# Patient Record
Sex: Female | Born: 1970
Health system: Southern US, Community
[De-identification: ages and names within clinical notes are randomized; demographics above are authoritative.]

## PROBLEM LIST (undated history)

## (undated) DIAGNOSIS — F319 Bipolar disorder, unspecified: Secondary | ICD-10-CM

## (undated) DIAGNOSIS — F309 Manic episode, unspecified: Secondary | ICD-10-CM

## (undated) DIAGNOSIS — F329 Major depressive disorder, single episode, unspecified: Secondary | ICD-10-CM

## (undated) DIAGNOSIS — F32A Depression, unspecified: Secondary | ICD-10-CM

## (undated) HISTORY — DX: Major depressive disorder, single episode, unspecified: F32.9

## (undated) HISTORY — DX: Depression, unspecified: F32.A

## (undated) HISTORY — PX: BACK SURGERY: SHX140

## (undated) HISTORY — DX: Manic episode, unspecified: F30.9

---

## 1994-01-16 HISTORY — PX: MYOMECTOMY: SHX85

## 2004-03-16 ENCOUNTER — Ambulatory Visit (HOSPITAL_COMMUNITY): Admission: RE | Admit: 2004-03-16 | Discharge: 2004-03-16 | Payer: Self-pay | Admitting: *Deleted

## 2005-03-31 ENCOUNTER — Ambulatory Visit (HOSPITAL_COMMUNITY): Admission: RE | Admit: 2005-03-31 | Discharge: 2005-03-31 | Payer: Self-pay | Admitting: *Deleted

## 2005-07-07 ENCOUNTER — Other Ambulatory Visit: Admission: RE | Admit: 2005-07-07 | Discharge: 2005-07-07 | Payer: Self-pay | Admitting: Obstetrics & Gynecology

## 2005-11-20 ENCOUNTER — Ambulatory Visit (HOSPITAL_COMMUNITY): Payer: Self-pay | Admitting: Psychiatry

## 2005-12-20 ENCOUNTER — Ambulatory Visit (HOSPITAL_COMMUNITY): Payer: Self-pay | Admitting: Psychiatry

## 2006-02-19 ENCOUNTER — Ambulatory Visit (HOSPITAL_COMMUNITY): Payer: Self-pay | Admitting: Psychiatry

## 2006-03-15 ENCOUNTER — Ambulatory Visit: Payer: Self-pay | Admitting: Family Medicine

## 2006-03-15 DIAGNOSIS — F3160 Bipolar disorder, current episode mixed, unspecified: Secondary | ICD-10-CM | POA: Insufficient documentation

## 2006-03-15 LAB — CONVERTED CEMR LAB: Rapid Strep: POSITIVE

## 2006-04-09 ENCOUNTER — Ambulatory Visit (HOSPITAL_COMMUNITY): Payer: Self-pay | Admitting: Psychiatry

## 2006-07-23 ENCOUNTER — Ambulatory Visit (HOSPITAL_COMMUNITY): Payer: Self-pay | Admitting: Psychiatry

## 2006-10-29 ENCOUNTER — Ambulatory Visit (HOSPITAL_COMMUNITY): Payer: Self-pay | Admitting: Psychiatry

## 2007-02-18 ENCOUNTER — Ambulatory Visit (HOSPITAL_COMMUNITY): Payer: Self-pay | Admitting: Psychiatry

## 2007-04-17 ENCOUNTER — Ambulatory Visit (HOSPITAL_COMMUNITY): Payer: Self-pay | Admitting: Psychiatry

## 2007-08-12 ENCOUNTER — Ambulatory Visit (HOSPITAL_COMMUNITY): Payer: Self-pay | Admitting: Psychiatry

## 2007-09-09 ENCOUNTER — Ambulatory Visit (HOSPITAL_COMMUNITY): Payer: Self-pay | Admitting: Psychiatry

## 2007-11-04 ENCOUNTER — Ambulatory Visit (HOSPITAL_COMMUNITY): Payer: Self-pay | Admitting: Psychiatry

## 2008-02-10 ENCOUNTER — Ambulatory Visit (HOSPITAL_COMMUNITY): Payer: Self-pay | Admitting: Psychiatry

## 2008-04-06 ENCOUNTER — Ambulatory Visit (HOSPITAL_COMMUNITY): Payer: Self-pay | Admitting: Psychiatry

## 2008-06-01 ENCOUNTER — Ambulatory Visit (HOSPITAL_COMMUNITY): Payer: Self-pay | Admitting: Psychiatry

## 2008-07-29 ENCOUNTER — Ambulatory Visit (HOSPITAL_COMMUNITY): Payer: Self-pay | Admitting: Psychiatry

## 2008-10-26 ENCOUNTER — Ambulatory Visit (HOSPITAL_COMMUNITY): Payer: Self-pay | Admitting: Psychiatry

## 2008-12-18 ENCOUNTER — Ambulatory Visit (HOSPITAL_COMMUNITY): Payer: Self-pay | Admitting: Psychiatry

## 2009-03-15 ENCOUNTER — Ambulatory Visit (HOSPITAL_COMMUNITY): Payer: Self-pay | Admitting: Psychiatry

## 2009-04-30 ENCOUNTER — Ambulatory Visit (HOSPITAL_COMMUNITY): Payer: Self-pay | Admitting: Psychiatry

## 2009-05-28 ENCOUNTER — Ambulatory Visit (HOSPITAL_COMMUNITY): Payer: Self-pay | Admitting: Licensed Clinical Social Worker

## 2009-07-13 ENCOUNTER — Ambulatory Visit (HOSPITAL_COMMUNITY): Payer: Self-pay | Admitting: Licensed Clinical Social Worker

## 2009-08-13 ENCOUNTER — Ambulatory Visit (HOSPITAL_COMMUNITY): Payer: Self-pay | Admitting: Psychiatry

## 2009-10-13 ENCOUNTER — Ambulatory Visit (HOSPITAL_COMMUNITY): Payer: Self-pay | Admitting: Psychiatry

## 2009-10-25 ENCOUNTER — Ambulatory Visit: Payer: Self-pay | Admitting: Family Medicine

## 2009-10-25 DIAGNOSIS — M79609 Pain in unspecified limb: Secondary | ICD-10-CM | POA: Insufficient documentation

## 2009-10-25 DIAGNOSIS — M722 Plantar fascial fibromatosis: Secondary | ICD-10-CM

## 2009-12-21 ENCOUNTER — Ambulatory Visit: Payer: Self-pay | Admitting: Family Medicine

## 2009-12-21 DIAGNOSIS — F329 Major depressive disorder, single episode, unspecified: Secondary | ICD-10-CM

## 2009-12-21 DIAGNOSIS — F3289 Other specified depressive episodes: Secondary | ICD-10-CM | POA: Insufficient documentation

## 2009-12-21 DIAGNOSIS — M546 Pain in thoracic spine: Secondary | ICD-10-CM

## 2009-12-28 ENCOUNTER — Ambulatory Visit: Payer: Self-pay | Admitting: Emergency Medicine

## 2009-12-28 ENCOUNTER — Encounter
Admission: RE | Admit: 2009-12-28 | Discharge: 2009-12-28 | Payer: Self-pay | Source: Home / Self Care | Attending: Emergency Medicine | Admitting: Emergency Medicine

## 2010-01-03 ENCOUNTER — Ambulatory Visit (HOSPITAL_COMMUNITY): Payer: Self-pay | Admitting: Psychiatry

## 2010-01-06 ENCOUNTER — Encounter: Payer: Self-pay | Admitting: Emergency Medicine

## 2010-02-15 NOTE — Letter (Signed)
Summary: Out of Work  [facility]  [address]   [place], [place] [address]   Phone: [phone]  Fax: [phone]    December 21, 2009   Employee:  MAYGAN KOELLER    To Whom It May Concern:   For Medical reasons, please excuse the above named employee from work today and tomorrow.      If you need additional information, please feel free to contact our office.         Sincerely,    Donna Christen MD

## 2010-02-15 NOTE — Letter (Signed)
Summary: CONTROLLED MED RX POLICY  CONTROLLED MED RX POLICY   Imported By: Dannette Barbara 12/21/2009 16:27:44  _____________________________________________________________________  External Attachment:    Type:   Image     Comment:   External Document

## 2010-02-15 NOTE — Assessment & Plan Note (Signed)
Summary: Both feet hurting, plantar fascititis   Vital Signs:  Patient profile:   40 year old female Height:      65 inches Weight:      202 pounds Pulse rate:   94 / minute BP sitting:   116 / 79  (left arm) Cuff size:   large  Vitals Entered By: Avon Gully CMA, Duncan Dull) (October 25, 2009 9:56 AM) CC: bot feet hurt the left has been hurting more in the past week, pain in the heel which radiated to the toes    Primary Care Provider:  Linford Arnold, C  CC:  bot feet hurt the left has been hurting more in the past week and pain in the heel which radiated to the toes .  History of Present Illness: bot feet hurt the left has been hurting more in the past week, pain in the left heel which radiated to the toes. Painis also in her distal arches. Pain is intermittant for the last 10 years but has been getting worse. Thinks had old injury to the right foot, not sure if a fracture.  Worse with beeing up on her feet. Worse at the end ofo the day. Does wear comfortable shoes. NO pain meds. The heel  pain is more new in the last few weeks. Painful to step on that foot when she first gets out of bed. Has never had the heel pain before.   Current Medications (verified): 1)  Lamictal 150 Mg Tabs (Lamotrigine) .... Take 1 Tablet By Mouth Once A Day 2)  Wellbutrin Xl 300 Mg Xr24h-Tab (Bupropion Hcl) .... Take One Tablet By Mouth Once A Day 3)  Clonazepam 0.5 Mg Tabs (Clonazepam) .... Take One Tablet By  Jodi Geralds Once A Day As Needed  Allergies (verified): No Known Drug Allergies  Comments:  Nurse/Medical Assistant: The patient's medications and allergies were reviewed with the patient and were updated in the Medication and Allergy Lists. Avon Gully CMA, Duncan Dull) (October 25, 2009 9:58 AM)  Physical Exam  General:  Well-developed,well-nourished,in no acute distress; alert,appropriate and cooperative throughout examination Msk:  Has mild collapse of the proximal arch of the foot and collpase of  the distal arch as well when she stand. DP pulse 2+. no laxity of the ankles Ankle strength 5/5.  No heel tenderness.    Impression & Recommendations:  Problem # 1:  FOOT PAIN, BILATERAL (ICD-729.5)  I do think she would benefit from metatarsal cookies since most o fher pain and foot sorenesss in in the distal areas. I don't think she has any known trauma or fractures.  Gave websit to order the cookies from. Also recommend since has had long term foot pain to consider custom orthotic inserts.   Orders: Sports Medicine (Sports Med)  Problem # 2:  PLANTAR FASCIITIS (ICD-728.71) Discussed dx. REcommmend icey massage, NSAIDs, stretches and comfortable footwear. Explained that this take a while to improve. If not better in one month then will rfer fo night splints and possibly injections. Pt will call and let me know if not better. Given H.O on stretches.   Complete Medication List: 1)  Lamictal 150 Mg Tabs (Lamotrigine) .... Take 1 tablet by mouth once a day 2)  Wellbutrin Xl 300 Mg Xr24h-tab (Bupropion hcl) .... Take one tablet by mouth once a day 3)  Clonazepam 0.5 Mg Tabs (Clonazepam) .... Take one tablet by  mouhth once a day as needed  Patient Instructions: 1)  Go to [email or web address] for metatarsal cookies to support your distal  foot  2)  For the heel pain (plantar fascititis) recommend Aleve two times a day with food. Stretches and supportive shoes. Also icey massage will help as well 3)  Will refer you for custom inserts for foot pain.   Contraindications/Deferment of Procedures/Staging:    Test/Procedure: FLU VAX    Reason for deferment: patient declined

## 2010-02-15 NOTE — Letter (Signed)
Summary: Consult Scheduled Letter-Sports Med  [facility]  [address]   [place], [place] [address]   Phone: [phone]  Fax: [phone]    10/25/2009 MRN: [id number]    Dear Ms. Lauture,      We have scheduled an appointment for you.  At the recommendation of Dr. Linford Arnold, we have scheduled you a consult with Dr. Pearletha Forge at the [facility] in [facility] on November 01, 2009 at 10:00.  Their phone number is [phone].  If this appointment day and time is not convenient for you, please feel free to call the office of the doctor you are being referred to at the number listed above and reschedule the appointment.     Thank you,  Diane Insurance risk surveyor at [facility]

## 2010-02-15 NOTE — Assessment & Plan Note (Signed)
Summary: BACK PAIN (rm 2)   Vital Signs:  Patient Profile:   40 Years Old Female CC:      right upper back pain x today Height:     65 inches Weight:      205 pounds O2 Sat:      99 % O2 treatment:    Room Air Temp:     98.4 degrees F oral Pulse rate:   82 / minute Resp:     16 per minute BP sitting:   138 / 94  (left arm) Cuff size:   large  Pt. in pain?   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  The pain is dull/achey and worse with movement and deep inspiration.  No cough.  No fevers, chills, and sweats.  No rash.  No GI or GU symptoms   REVIEW OF SYSTEMS Constitutional Symptoms      Denies fever, chills, night sweats, weight loss, weight gain, and fatigue.  Eyes       Denies change in vision, eye pain, eye discharge, glasses, contact lenses, and eye surgery. Ear/Nose/Throat/Mouth       Denies hearing loss/aids, change in hearing, ear pain, ear discharge, dizziness, frequent runny nose, frequent nose bleeds, sinus problems, sore throat, hoarseness, and tooth pain or bleeding.  Respiratory       Denies dry cough, productive cough, wheezing, shortness of breath, asthma, bronchitis, and emphysema/COPD.  Cardiovascular       Denies murmurs, chest pain, and tires easily with exhertion.     Gastrointestinal       Denies stomach pain, nausea/vomiting, diarrhea, constipation, blood in bowel movements, and indigestion. Genitourniary       Denies painful urination, kidney stones, and loss of urinary control. Neurological       Denies paralysis, seizures, and fainting/blackouts. Musculoskeletal       Complains of muscle pain, joint stiffness, and decreased range of motion.      Denies joint pain, redness, swelling, muscle weakness, and gout.  Skin       Denies bruising, unusual mles/lumps or sores, and hair/skin or nail changes.  Psych       Denies mood changes, temper/anger issues, anxiety/stress, speech problems, depression, and sleep problems. 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 She c/o sharp shooting pain since then.   Past History:  Past Medical History: Psych is Dr. Francella Solian ([place]) Depression   Objective:  Appearance:  Patient appears healthy, stated age, and in no acute distress  Eyes:  Pupils are equal, round, and reactive to light and accomdation.  Extraocular movement is intact.  Conjunctivae are not inflamed.  Pharynx:  Normal  Neck:  Supple.  No adenopathy is present.  No thyromegaly is present.  No tenderness  Lungs:  Clear to auscultation.  Breath sounds are equal.  Heart:  Regular rate and rhythm without murmurs, rubs, or gallops.  Abdomen:  Nontender without masses or hepatosplenomegaly.  Bowel sounds are present.  No CVA or flank tenderness.  Back:  Distinct tenderness along inferior edge of right scapula, worse with resisted abduction of the right shoulder. Skin:  No rash Assessment New Problems: BACK PAIN, THORACIC REGION, RIGHT (ICD-724.1) DEPRESSION (ICD-311)  RHOMBOID STRAIN  Plan New Medications/Changes: NAPROXEN 500 MG TABS (NAPROXEN) One by mouth two times a day pc  #20 x 0, 12/21/2009, Donna Christen MD  New Orders: New Patient Level III [phone] Planning Comments:   Begin  applying ice pack.  Begin Naproxen.  Begin range of motion exercises in 2 to 3 days ([employer] information and instruction patient handout given)  If not improving 2 weeks, recommend follow-up with sports med clinic. Advised to call back if rash develops (shingles)   The patient and/or caregiver has been counseled thoroughly with regard to medications prescribed including dosage, schedule, interactions, rationale for use, and possible side effects and they verbalize understanding.  Diagnoses and expected course of recovery discussed and will return if not improved as expected or if the condition worsens. Patient and/or caregiver verbalized understanding.  Prescriptions: NAPROXEN 500 MG TABS (NAPROXEN) One by mouth two times a day pc  #20 x 0   Entered and Authorized by:   Donna Christen MD   Signed by:   Donna Christen MD on 12/21/2009   Method used:   Print then Give to Patient   RxID:   [id number]   Orders Added: 1)  New Patient Level III [95621]

## 2010-02-17 NOTE — Letter (Signed)
Summary: RELEASE OF INFORMATION  RELEASE OF INFORMATION   Imported By: Dannette Barbara 01/06/2010 13:53:54  _____________________________________________________________________  External Attachment:    Type:   Image     Comment:   External Document

## 2010-03-16 ENCOUNTER — Encounter (HOSPITAL_COMMUNITY): Payer: Commercial Managed Care - PPO | Admitting: Psychiatry

## 2010-03-16 DIAGNOSIS — F319 Bipolar disorder, unspecified: Secondary | ICD-10-CM

## 2010-04-19 ENCOUNTER — Encounter: Payer: Self-pay | Admitting: Family Medicine

## 2010-04-25 ENCOUNTER — Encounter: Payer: Self-pay | Admitting: Family Medicine

## 2010-04-25 ENCOUNTER — Ambulatory Visit (INDEPENDENT_AMBULATORY_CARE_PROVIDER_SITE_OTHER): Payer: Commercial Managed Care - PPO | Admitting: Family Medicine

## 2010-04-25 VITALS — BP 173/84 | HR 102 | Wt 215.0 lb

## 2010-04-25 DIAGNOSIS — R5381 Other malaise: Secondary | ICD-10-CM

## 2010-04-25 DIAGNOSIS — R5383 Other fatigue: Secondary | ICD-10-CM

## 2010-04-25 LAB — CBC
HCT: 41.8 % (ref 36.0–46.0)
Hemoglobin: 14.6 g/dL (ref 12.0–15.0)
MCHC: 34.9 g/dL (ref 30.0–36.0)
MCV: 93.8 fL (ref 78.0–100.0)
Platelets: 230 10*3/uL (ref 150–400)
RBC: 4.45 MIL/uL (ref 3.87–5.11)
RDW: 13.2 % (ref 11.5–15.5)

## 2010-04-25 NOTE — Progress Notes (Signed)
  Subjective:    Patient ID: Charlotte Hobbs, female    DOB: 08-12-1970, 40 y.o.   MRN: [id number]  HPI She is very active  But is gaining weight and she is fatigued. Has gained about 40 lb sin the last 2 years. Does work out 2-3 days per week for 45 min. She is on her feet all day. Says doesnt think she should weight this much.  No family hx of thyroid problems. Personal hx of anemia.  Having her period twice a month. Intermitant constipation and diarrhea. Rarely normal. Has had slight chest pain on and off for years.  Small amt of thinning of her hair on the top. She is still smoking.   Frequent urination.  No dysuria. No blood in the stool.  Noticed some SOB but not with exercises.  Can happen at rest. That started about a year ago.  This is daily.    On Wellbutrin for hands tremors. She is also feeling some irritability. Feels she having some breakthrough symptoms. Home life is hectic.  Job is stresful but this has been this way  7 years. Sleeping well. NO snoring.    Review of Systems     Objective:   Physical Exam  Constitutional: She is oriented to person, place, and time. She appears well-developed.  HENT:  Head: Normocephalic and atraumatic.  Right Ear: External ear normal.  Left Ear: External ear normal.  Nose: Nose normal.  Mouth/Throat: Oropharynx is clear and moist.  Eyes: Pupils are equal, round, and reactive to light.  Neck: Normal range of motion. Neck supple. No thyromegaly present.       Small LN on the right side of neck along the sternocletomastoid.   Cardiovascular: Normal rate, regular rhythm and normal heart sounds.   Pulmonary/Chest: Effort normal and breath sounds normal.  Abdominal: Soft. Bowel sounds are normal. She exhibits no distension and no mass. There is no tenderness.  Musculoskeletal: She exhibits no edema.  Lymphadenopathy:    She has cervical adenopathy.  Neurological: She is alert and oriented to person, place, and time. She has normal reflexes.    Skin: Skin is warm and dry.  Psychiatric: She has a normal mood and affect.          Assessment & Plan:  Fatigue- Unclear etiology. Will rule out anemia since she is having frequent periods and has a hx of anemia.  Also will rule out thyroid d/o. Discussed this could.  Will also evaluatel the liver and kidney function.  Consider also her mood may be contributing but she feels this is stable.  It doesn't sound like she has sleep apnea.   Weight gain - If labs are normal then also consider counseling for diet and increasing her days for exercise.  None of her meds should be causing weight gain.

## 2010-04-26 ENCOUNTER — Telehealth: Payer: Self-pay | Admitting: Family Medicine

## 2010-04-26 LAB — COMPLETE METABOLIC PANEL WITH GFR
ALT: 17 U/L (ref 0–35)
AST: 13 U/L (ref 0–37)
Albumin: 4.2 g/dL (ref 3.5–5.2)
Alkaline Phosphatase: 71 U/L (ref 39–117)
BUN: 13 mg/dL (ref 6–23)
GFR, Est Non African American: >60 mL/min (ref >60)
Glucose, Bld: 99 mg/dL (ref 70–99)
Potassium: 4.3 mEq/L (ref 3.5–5.3)
Sodium: 139 mEq/L (ref 135–145)
Total Bilirubin: 0.5 mg/dL (ref 0.3–1.2)
Total Protein: 6.7 g/dL (ref 6.0–8.3)

## 2010-04-26 LAB — FOLATE: Folate: 10.8 ng/mL

## 2010-04-26 NOTE — Telephone Encounter (Signed)
Call pt: Labs overall look great. Thyroid is nl.  B12 is on the low end of normal. Rec start daily OTC Vit B12 supplement. This will help you energy level. Also your vit D was low. Start 1000iu daily OTC as well. Then we can recheck levels in 3 months. I know she is concerned about her wt as well. We can refer for nutrition counseling and rec increase exercise to 5 days per week to wt loss.

## 2010-04-26 NOTE — Telephone Encounter (Signed)
LMOM for pt to callif want nutrition referral and left results on pts vm

## 2010-05-09 ENCOUNTER — Encounter (HOSPITAL_COMMUNITY): Payer: 59 | Admitting: Psychiatry

## 2010-05-09 DIAGNOSIS — F3189 Other bipolar disorder: Secondary | ICD-10-CM

## 2010-05-11 ENCOUNTER — Encounter (HOSPITAL_COMMUNITY): Payer: Commercial Managed Care - PPO | Admitting: Psychiatry

## 2010-07-11 ENCOUNTER — Encounter (HOSPITAL_COMMUNITY): Payer: 59 | Admitting: Psychiatry

## 2010-07-11 DIAGNOSIS — F3189 Other bipolar disorder: Secondary | ICD-10-CM

## 2010-09-05 ENCOUNTER — Encounter (HOSPITAL_COMMUNITY): Payer: 59 | Admitting: Psychiatry

## 2010-09-05 DIAGNOSIS — F3189 Other bipolar disorder: Secondary | ICD-10-CM

## 2010-11-07 ENCOUNTER — Encounter (HOSPITAL_COMMUNITY): Payer: 59 | Admitting: Psychiatry

## 2010-11-16 ENCOUNTER — Encounter (HOSPITAL_COMMUNITY): Payer: 59 | Admitting: Psychiatry

## 2010-11-16 DIAGNOSIS — F3189 Other bipolar disorder: Secondary | ICD-10-CM

## 2010-12-05 ENCOUNTER — Encounter: Payer: Self-pay | Admitting: Family Medicine

## 2010-12-05 ENCOUNTER — Ambulatory Visit (INDEPENDENT_AMBULATORY_CARE_PROVIDER_SITE_OTHER): Payer: 59 | Admitting: Family Medicine

## 2010-12-05 VITALS — BP 125/82 | HR 101 | Ht 64.0 in | Wt 233.0 lb

## 2010-12-05 DIAGNOSIS — R030 Elevated blood-pressure reading, without diagnosis of hypertension: Secondary | ICD-10-CM

## 2010-12-05 DIAGNOSIS — E669 Obesity, unspecified: Secondary | ICD-10-CM

## 2010-12-05 DIAGNOSIS — R634 Abnormal weight loss: Secondary | ICD-10-CM

## 2010-12-05 MED ORDER — PHENTERMINE HCL 30 MG PO CAPS: 30.0000 mg | ORAL_CAPSULE | ORAL | Status: DC

## 2010-12-05 NOTE — Patient Instructions (Signed)
  Will try phentermine to lose weight will ned a PE next month. Consider information on genetic testing and plan to lose weight.  Exercise to Lose Weight Exercise and a healthy diet may help you lose weight. Your doctor may suggest specific exercises. EXERCISE IDEAS AND TIPS  Choose low-cost things you enjoy doing, such as walking, bicycling, or exercising to workout videos.   Take stairs instead of the elevator.   Walk during your lunch break.   Park your car further away from work or school.   Go to a gym or an exercise class.   Start with 5 to 10 minutes of exercise each day. Build up to 30 minutes of exercise 4 to 6 days a week.   Wear shoes with good support and comfortable clothes.   Stretch before and after working out.   Work out until you breathe harder and your heart beats faster.   Drink extra water when you exercise.   Do not do so much that you hurt yourself, feel dizzy, or get very short of breath.  Exercises that burn about 150 calories:  Running 1  miles in 15 minutes.   Playing volleyball for 45 to 60 minutes.   Washing and waxing a car for 45 to 60 minutes.   Playing touch football for 45 minutes.   Walking 1  miles in 35 minutes.   Pushing a stroller 1  miles in 30 minutes.   Playing basketball for 30 minutes.   Raking leaves for 30 minutes.   Bicycling 5 miles in 30 minutes.   Walking 2 miles in 30 minutes.   Dancing for 30 minutes.   Shoveling snow for 15 minutes.   Swimming laps for 20 minutes.   Walking up stairs for 15 minutes.   Bicycling 4 miles in 15 minutes.   Gardening for 30 to 45 minutes.   Jumping rope for 15 minutes.   Washing windows or floors for 45 to 60 minutes.  Document Released: 02/04/2010 Document Revised: 09/14/2010 Document Reviewed: 02/04/2010 [facility] Patient Information 2012 [place], [place].

## 2010-12-05 NOTE — Progress Notes (Signed)
  Subjective:    Patient ID: Charlotte Hobbs, female    DOB: 08-31-1970, 40 y.o.   MRN: [id number]  HPI  Patient is here for weight loss evaluation. She's very tense gastric bypass surgery and the visit was to basically get the surgical group the data to show that she is a good candidate or appropriate candidate for bypass surgery to the patient is somewhat concerned about the surgery and after discussion turns out she really does not want to have the surgery done now. Couple things concern me is that there is no strong evidence we will try to help her lose weight she's had weight trouble for the last 15-20 years UA showed between 150 and 271 but never has really been able to maintain her appropriate weight. 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  Review of Systems  All other systems reviewed and are negative.      BP 125/82  Pulse 101  Ht 5\' 4"  (1.626 m)  Wt 233 lb (105.688 kg)  BMI 39.99 kg/m2  SpO2 99%  LMP 11/14/2010  Objective:   Physical Exam  Constitutional: She is oriented to person, place, and time. She appears well-developed and well-nourished.       This obese white female no acute distress  HENT:  Head: Normocephalic.  Cardiovascular: Normal rate, regular rhythm and normal heart sounds.   Pulmonary/Chest: Effort normal.  Musculoskeletal: Normal range of motion.  Neurological: She is alert and oriented to person, place, and time.  Skin: Skin is warm and dry.  Psychiatric: She has a normal mood and affect.          Assessment & Plan:  After an extensive time spent counseling and talking we will try her on phentermine 30 mg one capsule a day she's come back in a month have complete physical. We'll give her more summation on some of the natural ways losing weight also stressed her the importance of having an idea of genetically what her makeup  is. Explained to her that her makeup to be that she has to avoid carbs or has to avoid fats or knees due to portion control she doesn't think is the way for her and that she might need a combination of treatments. Just importance of not smoking and also stressed the importance of exercise to lose weight sucessfully. BP initial presentation was elevated but better at the time of her departure.

## 2011-01-03 ENCOUNTER — Encounter: Payer: Self-pay | Admitting: Family Medicine

## 2011-01-05 ENCOUNTER — Ambulatory Visit (INDEPENDENT_AMBULATORY_CARE_PROVIDER_SITE_OTHER): Payer: 59 | Admitting: Family Medicine

## 2011-01-05 ENCOUNTER — Encounter: Payer: Self-pay | Admitting: Family Medicine

## 2011-01-05 DIAGNOSIS — Z0000 Encounter for general adult medical examination without abnormal findings: Secondary | ICD-10-CM

## 2011-01-05 MED ORDER — PHENTERMINE HCL 30 MG PO CAPS: 30.0000 mg | ORAL_CAPSULE | ORAL | Status: DC

## 2011-01-05 NOTE — Patient Instructions (Signed)
Start a regular exercise program and make sure you are eating a healthy diet Try to eat 4 servings of dairy a day or take a calcium supplement (500mg twice a day). Your vaccines are up to date.   

## 2011-01-05 NOTE — Progress Notes (Signed)
  Subjective:     Charlotte Hobbs is a 40 y.o. female and is here for a comprehensive physical exam. The patient reports no problems.  History   Social History  . Marital Status: Married    Spouse Name: N/A    Number of Children: 1  . Years of Education: N/A   Occupational History  . NURSE [employer]    Social History Main Topics  . Smoking status: Current Some Day Smoker    Types: Cigarettes  . Smokeless tobacco: Not on file  . Alcohol Use: No  . Drug Use: No  . Sexually Active: Not on file   Other Topics Concern  . Not on file   Social History Narrative   No regular exercise.  Divorced.    Health Maintenance  Topic Date Due  . Pap Smear  01/17/2011  . Influenza Vaccine  10/17/2011  . Tetanus/tdap  07/17/2019    The following portions of the patient's history were reviewed and updated as appropriate: allergies, current medications, past family history, past medical history, past social history, past surgical history and problem list.  Review of Systems A comprehensive review of systems was negative.   Objective:    BP 117/80  Pulse 92  Wt 219 lb (99.338 kg) General appearance: alert, cooperative, appears stated age and mildly obese Head: Normocephalic, without obvious abnormality, atraumatic Eyes: conjunctiva, PEERLA, EOMi Ears: normal TM's and external ear canals both ears Nose: Nares normal. Septum midline. Mucosa normal. No drainage or sinus tenderness. Throat: lips, mucosa, and tongue normal; teeth and gums normal Neck: no adenopathy, no carotid bruit, no JVD, supple, symmetrical, trachea midline and thyroid not enlarged, symmetric, no tenderness/mass/nodules Back: symmetric, no curvature. ROM normal. No CVA tenderness. Lungs: clear to auscultation bilaterally Breasts: normal appearance, no masses or tenderness Heart: regular rate and rhythm, S1, S2 normal, no murmur, click, rub or gallop Abdomen: soft, non-tender; bowel sounds normal; no masses,  no  organomegaly Extremities: extremities normal, atraumatic, no cyanosis or edema Pulses: 2+ and symmetric Skin: Skin color, texture, turgor normal. No rashes or lesions Lymph nodes: Cervical, supraclavicular, and axillary nodes normal. Neurologic: Grossly normal    Assessment:    Healthy female exam.      Plan:     See After Visit Summary for Counseling Recommendations  Start a regular exercise program and make sure you are eating a healthy diet Try to eat 4 servings of dairy a day or take a calcium supplement (500mg  twice a day). Your vaccines are up to date.

## 2011-02-06 ENCOUNTER — Ambulatory Visit: Payer: 59 | Admitting: Family Medicine

## 2011-02-07 ENCOUNTER — Ambulatory Visit (INDEPENDENT_AMBULATORY_CARE_PROVIDER_SITE_OTHER): Payer: 59 | Admitting: Family Medicine

## 2011-02-07 VITALS — BP 122/84 | HR 100 | Wt 212.0 lb

## 2011-02-07 DIAGNOSIS — E669 Obesity, unspecified: Secondary | ICD-10-CM

## 2011-02-07 MED ORDER — PHENTERMINE HCL 30 MG PO CAPS: 30.0000 mg | ORAL_CAPSULE | ORAL | Status: DC

## 2011-02-07 NOTE — Progress Notes (Signed)
  Subjective:    Patient ID: Charlotte Hobbs, female    DOB: [birth year], 41 y.o.   MRN: [id number]  HPI   Pt denies chest pain, SOB, dizziness, or heart palpitations.  Taking meds as directed w/o problems.  Denies medication side effects.  5 min spent with pt. Patient doing well on appetite suppressant.  Here for nurse visit, weight, BP, HR check.  Denies problems with insomnia, heart palpitations or tremors.  Satisfied with weight loss thus far and is working on [employer] and regular exercise.        Review of Systems     Objective:   Physical Exam        Assessment & Plan:  Obesity-she has lost 7 pounds generally will not intervene without any side effects. Blood pressure is well controlled. I will refill her medication and she can followup in one month for repeat blood pressure and weight check./CM

## 2011-03-08 ENCOUNTER — Ambulatory Visit (HOSPITAL_COMMUNITY): Payer: 59 | Admitting: Psychiatry

## 2011-03-10 ENCOUNTER — Ambulatory Visit: Payer: 59

## 2011-03-13 ENCOUNTER — Ambulatory Visit (INDEPENDENT_AMBULATORY_CARE_PROVIDER_SITE_OTHER): Payer: 59 | Admitting: Psychiatry

## 2011-03-13 ENCOUNTER — Encounter (HOSPITAL_COMMUNITY): Payer: Self-pay | Admitting: Psychiatry

## 2011-03-13 DIAGNOSIS — F319 Bipolar disorder, unspecified: Secondary | ICD-10-CM

## 2011-03-13 MED ORDER — LAMOTRIGINE 150 MG PO TABS: 150.0000 mg | ORAL_TABLET | Freq: Two times a day (BID) | ORAL | Status: DC

## 2011-03-13 MED ORDER — BUPROPION HCL ER (XL) 300 MG PO TB24: 300.0000 mg | ORAL_TABLET | Freq: Every day | ORAL | Status: DC

## 2011-03-13 NOTE — Progress Notes (Signed)
Chief complaint I have increased anxiety and mood swings  History of presenting illness Patient is 41 year old Caucasian employed female who came for her followup appointment. She was doing better on Lamictal and Wellbutrin until recently she complained of agitation anger and mood swing. Patient to call taking phentermine for weight loss. Patient endorse since then she has been noticing change in her behavior. Though she lost 25 pounds but she is very concerned about her behavior. She is also complaining poor sleep and racing thoughts. She denies any active or passive suicidal thinking. She denies any psychosis or paranoid.  Mental status examination Patient is mildly obese female who is casually dressed and fairly groomed. Her speech is fast but clear and coherent. She described her mood is anxious and her affect is mood congruent. She denies any active or passive suicidal thinking and homicidal thinking. There no psychotic symptoms present. Her attention and concentration is fair. Her speech is fast and rapid but coherent. She denies any auditory or visual hallucination. She's alert and oriented x3. Her insight judgment and impulse control is okay.  Assessment Axis I mood disorder NOS rule out bipolar disorder 1 Axis II deferred Axis III see medical history Axis IV mild to moderate  Plan I recommended to discontinue phentermine that may be causing change in her behavior. I recommended to see therapist which she used to see in the past. Patient agreed, I will schedule appointment with Belenda Cruise in this office for increase coping and social skills. I recommended to call us if this stopping phentermine does not help. I explained risks and benefits of medication. I will see her again in 3 months

## 2011-03-21 ENCOUNTER — Ambulatory Visit: Payer: 59

## 2011-03-29 ENCOUNTER — Ambulatory Visit (HOSPITAL_COMMUNITY): Payer: Self-pay | Admitting: Licensed Clinical Social Worker

## 2011-06-05 ENCOUNTER — Ambulatory Visit (INDEPENDENT_AMBULATORY_CARE_PROVIDER_SITE_OTHER): Payer: 59 | Admitting: Psychiatry

## 2011-06-05 DIAGNOSIS — F319 Bipolar disorder, unspecified: Secondary | ICD-10-CM

## 2011-06-05 DIAGNOSIS — F3160 Bipolar disorder, current episode mixed, unspecified: Secondary | ICD-10-CM

## 2011-06-05 MED ORDER — BUPROPION HCL ER (XL) 300 MG PO TB24: 300.0000 mg | ORAL_TABLET | Freq: Every day | ORAL | Status: DC

## 2011-06-05 MED ORDER — HYDROXYZINE PAMOATE 25 MG PO CAPS: 25.0000 mg | ORAL_CAPSULE | Freq: Two times a day (BID) | ORAL | Status: AC | PRN

## 2011-06-05 MED ORDER — LAMOTRIGINE 150 MG PO TABS: 150.0000 mg | ORAL_TABLET | Freq: Two times a day (BID) | ORAL | Status: DC

## 2011-06-05 NOTE — Progress Notes (Signed)
Chief complaint Medication management and followup  History of presenting illness Patient is 41 year old Caucasian employed female who came for her followup appointment.  She is been compliant with her medication.  Recently she has noticed increased anxiety and agitation.  Her father died 3 weeks ago and she was very close with them.  Her sister also moved in with her and patient feel that she does not have enough privacy .  She admitted lately some tearfulness and anxiety attack but she also afraid to take more medication.  She's also not sure if she is going through menopause.  On her last visit we have scheduled appointment with therapist however due to the conflict of appointment patient was unable to see therapist.  She has another appointment on June 3.  Patient denies any active or passive suicidal thoughts or homicidal thoughts however she realized that she need to see therapist.  She admitted being emotional tearful and having crying spells due to the loss of her father .  She had stopped taking phentermine and has notice her behavior has been much improved until recently more anxious and her father died.  She's not drinking or using any illegal substance.  She denies any side effects of medication.  Patient lives with her daughter and recently her sister moved in.  She is working as a Pensions consultant in radiology.    Current psychiatric medication Lamictal 150 mg 2 tablet daily Wellbutrin XL 300 mg daily  Medical history Obesity.  She see Dr. Linford Arnold in [facility].    Mental status examination Patient is mildly obese female who is casually dressed and fairly groomed.  She is anxious, emotional and tearful at times but cooperative.  Her speech is fast but clear and coherent. She described her mood is anxious and her affect is mood congruent. She denies any active or passive suicidal thinking and homicidal thinking. There no psychotic symptoms present. Her attention and concentration is fair. Her  speech is fast and rapid but coherent. She denies any auditory or visual hallucination. She's alert and oriented x3. Her insight judgment and impulse control is okay.  Assessment Axis I mood disorder NOS rule out bipolar disorder 1 Axis II deferred Axis III see medical history Axis IV mild to moderate  Plan I review psychosocial stressors, response to medication her last progress note.  I recommend to see her primary care physician for further workup to rule out menopause if that causing her more emotional .I also encouraged her to see therapist for coping and social skills along with grief counseling.  I will start Vistaril 25 mg 1-2 capsule as needed for extreme anxiety.  I explained risks and benefits of medication including sedation Vistaril.  I will continue her Wellbutrin and Lamictal at present does.  She denies any side effects of medication.  I recommend to call us if she is a question or concern about the medication otherwise I will see her again in 3 months.

## 2011-06-19 ENCOUNTER — Ambulatory Visit (INDEPENDENT_AMBULATORY_CARE_PROVIDER_SITE_OTHER): Payer: 59 | Admitting: Licensed Clinical Social Worker

## 2011-06-19 ENCOUNTER — Encounter (HOSPITAL_COMMUNITY): Payer: Self-pay | Admitting: Licensed Clinical Social Worker

## 2011-06-19 DIAGNOSIS — F3160 Bipolar disorder, current episode mixed, unspecified: Secondary | ICD-10-CM | POA: Insufficient documentation
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
## 2011-07-03 ENCOUNTER — Ambulatory Visit (HOSPITAL_COMMUNITY): Payer: Self-pay | Admitting: Licensed Clinical Social Worker

## 2011-07-26 ENCOUNTER — Encounter: Payer: Self-pay | Admitting: Family Medicine

## 2011-09-06 ENCOUNTER — Encounter (HOSPITAL_COMMUNITY): Payer: Self-pay | Admitting: Psychiatry

## 2011-09-06 ENCOUNTER — Ambulatory Visit (INDEPENDENT_AMBULATORY_CARE_PROVIDER_SITE_OTHER): Payer: 59 | Admitting: Psychiatry

## 2011-09-06 DIAGNOSIS — F39 Unspecified mood [affective] disorder: Secondary | ICD-10-CM

## 2011-09-06 DIAGNOSIS — F319 Bipolar disorder, unspecified: Secondary | ICD-10-CM

## 2011-09-06 MED ORDER — LAMOTRIGINE 150 MG PO TABS: 150.0000 mg | ORAL_TABLET | Freq: Two times a day (BID) | ORAL | Status: DC

## 2011-09-06 MED ORDER — BUPROPION HCL ER (XL) 300 MG PO TB24: 300.0000 mg | ORAL_TABLET | Freq: Every day | ORAL | Status: DC

## 2011-09-06 NOTE — Progress Notes (Signed)
Chief complaint Medication management and followup  History of presenting illness Patient is 41 year old Caucasian employed female who came for her followup appointment.  She is been compliant with her medication.  On her last visit we tried Vistaril however patient tried but caused sedation and she stopped.  She is doing overall better she saw therapist once and she will schedule to see her again.  She has some stress at work but otherwise she is doing better.  She had agitation anger mood swing.  She sleeping better.  She denies any recent panic attack.  She denies any rash or itching with Lamictal.  She's not drinking or using any illegal substance.  Current psychiatric medication Lamictal 150 mg 2 tablet daily Wellbutrin XL 300 mg daily  Past psychiatric history Patient has a persistent psychiatric inpatient treatment or suicidal attempt.  Medical history Obesity.  She see Dr. Linford Arnold in [place].    Mental status examination Patient is mildly obese female who is casually dressed and fairly groomed.  She is pleasant and cooperative.  She described her mood is anxious and her affect is mood appropriate.  She denies any active or passive suicidal thoughts or homicidal thoughts but there were no delusion or psychotic symptoms present.  Her attention and concentration is good.  She's alert and oriented x3.  Her insight judgment and pulse control is okay.  Assessment Axis I mood disorder NOS rule out bipolar disorder 1 Axis II deferred Axis III see medical history Axis IV mild to moderate  Plan I will continue her current psychiatric medication.  We will discontinue Vistaril.  I will see her in 3 months.  She will see therapist for coping and social skills.  I recommend to call us if his any question or concern about the medication or feel worsening of the symptoms.

## 2011-09-22 ENCOUNTER — Ambulatory Visit (HOSPITAL_COMMUNITY): Payer: Self-pay | Admitting: Licensed Clinical Social Worker

## 2011-10-22 ENCOUNTER — Encounter: Payer: Self-pay | Admitting: *Deleted

## 2011-10-22 ENCOUNTER — Emergency Department (INDEPENDENT_AMBULATORY_CARE_PROVIDER_SITE_OTHER): Payer: 59

## 2011-10-22 ENCOUNTER — Emergency Department
Admission: EM | Admit: 2011-10-22 | Discharge: 2011-10-22 | Disposition: A | Discharge status: Home / Self Care | Payer: 59 | Source: Home / Self Care | Attending: Emergency Medicine | Admitting: Emergency Medicine

## 2011-10-22 DIAGNOSIS — M5431 Sciatica, right side: Secondary | ICD-10-CM

## 2011-10-22 DIAGNOSIS — M5137 Other intervertebral disc degeneration, lumbosacral region: Secondary | ICD-10-CM

## 2011-10-22 DIAGNOSIS — M5430 Sciatica, unspecified side: Secondary | ICD-10-CM

## 2011-10-22 DIAGNOSIS — M549 Dorsalgia, unspecified: Secondary | ICD-10-CM

## 2011-10-22 DIAGNOSIS — M79609 Pain in unspecified limb: Secondary | ICD-10-CM

## 2011-10-22 DIAGNOSIS — IMO0001 Reserved for inherently not codable concepts without codable children: Secondary | ICD-10-CM

## 2011-10-22 MED ORDER — HYDROCODONE-ACETAMINOPHEN 5-500 MG PO TABS: ORAL_TABLET | ORAL | Status: DC

## 2011-10-22 MED ORDER — PREDNISONE (PAK) 10 MG PO TABS: ORAL_TABLET | ORAL | Status: DC

## 2011-10-22 MED ORDER — ETODOLAC 500 MG PO TABS: 500.0000 mg | ORAL_TABLET | Freq: Two times a day (BID) | ORAL | Status: DC

## 2011-10-22 NOTE — ED Notes (Signed)
Pt has had lower back for 1 wk with shooting pain down her Rt leg.  Pt also experiencing rt foot numbness.  Pain is described as sharp and a 10/10 pain scale when she wakes up in the am

## 2011-10-22 NOTE — ED Provider Notes (Signed)
History     CSN: [id number]  Arrival date & time 10/22/11  1145   First MD Initiated Contact with Patient 10/22/11 1226      Chief Complaint  Patient presents with  . Back Pain  . Leg Pain    Rt     Patient is a 41 y.o. female presenting with back pain and leg pain. The history is provided by the patient.  Back Pain  This is a new problem. Episode onset: One week. The problem occurs constantly. The problem has been gradually worsening. The pain is associated with no known injury. The pain is present in the lumbar spine (Mid and right lumbar spine). Quality:  sharp. The pain radiates to the right foot. The pain is at a severity of 10/10 (Was as high as a 10 when getting up from lying down, currently 5-6 while sitting). The symptoms are aggravated by bending, twisting and certain positions. Stiffness is present all day. Associated symptoms include numbness (Right calf and right foot) and leg pain. Pertinent negatives include no chest pain, no fever, no weight loss, no headaches, no abdominal pain, no abdominal swelling, no bowel incontinence, no perianal numbness, no bladder incontinence, no dysuria, no pelvic pain and no paresis. She has tried NSAIDs (Ibuprofen) for the symptoms. The treatment provided no relief. Risk factors: No history of steroid use, cancer, osteoporosis.  Leg Pain  Associated symptoms include numbness (Right calf and right foot).   the lumbar pain can radiate into the right buttock.  Past medical history: of some type of L4-5 compression fracture about 10 years ago with some intermittent lumbar pain for years afterwards. She states she's been to chiropractor and had a shoe insert to correct leg length difference, and she states she had not had any lumbar pain for at least a year until now.  No history of back surgery or injections. Past Medical History  Diagnosis Date  . Depression   . Mania     Past Surgical History  Procedure Date  . Myomectomy 1996  .  Cesarean section 2002    Family History  Problem Relation Age of Onset  . Hypertension Father   . Heart attack Father 52  . Anxiety disorder Father   . Heart attack Paternal Grandfather 40    History  Substance Use Topics  . Smoking status: Current Some Day Smoker -- 0.5 packs/day for 25 years    Types: Cigarettes  . Smokeless tobacco: Not on file  . Alcohol Use: No    OB History    Grav Para Term Preterm Abortions TAB SAB Ect Mult Living                  Review of Systems  Constitutional: Negative for fever and weight loss.  Cardiovascular: Negative for chest pain.  Gastrointestinal: Negative for abdominal pain and bowel incontinence.  Genitourinary: Negative for bladder incontinence, dysuria and pelvic pain.  Musculoskeletal: Positive for back pain. Negative for joint swelling.  Neurological: Positive for numbness (Right calf and right foot). Negative for headaches.  All other systems reviewed and are negative.   she denies any neck or thoracic type pain.  Allergies  Review of patient's allergies indicates no known allergies.  Home Medications   Current Outpatient Rx  Name Route Sig Dispense Refill  . BUPROPION HCL ER (XL) 300 MG PO TB24 Oral Take 1 tablet (300 mg total) by mouth daily. 30 tablet 2  . HYDROCODONE-ACETAMINOPHEN 5-500 MG PO TABS  Take 1  or 2 every 4-6 hours as needed for severe pain 15 tablet 0  . LAMOTRIGINE 150 MG PO TABS Oral Take 1 tablet (150 mg total) by mouth 2 (two) times daily. 60 tablet 2  . PREDNISONE (PAK) 10 MG PO TABS  Take as directed for 6 days. 21 tablet 0    BP 117/85  Pulse 91  Temp 97.8 F (36.6 C) (Oral)  Resp 14  Ht 5\' 5"  (1.651 m)  Wt 203 lb (92.08 kg)  BMI 33.78 kg/m2  SpO2 100%  LMP 10/20/2011  Physical Exam  Nursing note and vitals reviewed. Constitutional: She is oriented to person, place, and time. She appears well-developed and well-nourished. She is cooperative.  Non-toxic appearance. She appears distressed  (Appears uncomfortable from low back pain.).  HENT:  Head: Normocephalic and atraumatic.  Mouth/Throat: Oropharynx is clear and moist.  Eyes: EOM are normal. Pupils are equal, round, and reactive to light. No scleral icterus.  Neck: Neck supple.  Cardiovascular: Regular rhythm and normal heart sounds.   Pulmonary/Chest: Effort normal and breath sounds normal. No respiratory distress. She has no wheezes. She has no rales. She exhibits no tenderness.  Abdominal: Soft. There is no tenderness.  Musculoskeletal:       Right hip: Normal.       Left hip: Normal.       Cervical back: She exhibits no tenderness.       Thoracic back: She exhibits no tenderness.       Lumbar back: She exhibits decreased range of motion, tenderness, bony tenderness and spasm. She exhibits no swelling, no edema, no deformity, no laceration and normal pulse.       Right straight leg raise test is very positive at 10.  Very tender to palpation over right sciatic notch.  Negative Left straight leg-raise test.  Negative Right Luisa Hart test. Negative Left Luisa Hart test.    Neurological: She is alert and oriented to person, place, and time. She has normal strength. She displays no atrophy, no tremor and normal reflexes. A sensory deficit (On repeat exam, there is mild decreased sensation to light touch in the right calf and right foot.) is present. No cranial nerve deficit. She exhibits normal muscle tone. Gait normal.  Reflex Scores:      Patellar reflexes are 2+ on the right side and 2+ on the left side.      Achilles reflexes are 2+ on the right side and 2+ on the left side. Skin: Skin is warm, dry and intact. No lesion and no rash noted.  Psychiatric: She has a normal mood and affect.    ED Course  Procedures (including critical care time) 2:00 PM x-ray LS-spine ordered   Labs Reviewed - No data to display Dg Lumbar Spine Complete  10/22/2011  *RADIOLOGY REPORT*  Clinical Data: Severe back and right leg pain  for 1 week.  LUMBAR SPINE - COMPLETE 4+ VIEW  Comparison: Lumbar spine radiographs 12/28/2009.  Findings: There are five lumbar type vertebral bodies with a slightly transitional S1 segment.  Moderate disc space loss and osteophyte formation at L5-S1 appear stable.  The additional disc spaces are preserved.  There is no evidence of acute fracture or pars defect.  Mild facet degenerative changes are present inferiorly.  IMPRESSION: Stable degenerative disc disease and facet hypertrophy at L5-S1. No acute osseous findings or malalignment.   Original Report Authenticated By: Gerrianne Scale, M.D.      1. Sciatica of right side   2. Radicular  pain of right lower back       MDM  Discussed results of x-ray with patient today. X-ray LS-spine shows stable degenerative disc disease and facet hypertrophy L5-S1, but no acute abnormalities. Likely diagnosis is sciatica, but it's possible she could have discogenic( L4-5, or L5-S1) cause for her radiating pain to the right lower extremity. Risks, benefits, alternatives discussed. Treat with Vicodin for acute short-term pain. Generic Lodine for anti-inflammatory. Precautions discussed. We discussed the option of treating with by mouth prednisone.--After risks, benefits, alternatives discussed, she politely declined taking any prednisone. Explained that she needs followup with her PCP within 4 days, sooner if worse or new symptoms. See detailed Instructions in AVS, which were given to patient. Verbal instructions also given. Risks, benefits, and alternatives of treatment options discussed. Questions invited and answered. Patient voiced understanding and agreement with plans.         Lajean Manes, MD 10/22/11 [phone]

## 2011-10-23 ENCOUNTER — Ambulatory Visit (INDEPENDENT_AMBULATORY_CARE_PROVIDER_SITE_OTHER): Payer: 59 | Admitting: Family Medicine

## 2011-10-23 ENCOUNTER — Encounter: Payer: Self-pay | Admitting: Family Medicine

## 2011-10-23 VITALS — BP 126/84 | HR 90 | Wt 204.0 lb

## 2011-10-23 DIAGNOSIS — M5430 Sciatica, unspecified side: Secondary | ICD-10-CM

## 2011-10-23 MED ORDER — KETOROLAC TROMETHAMINE 60 MG/2ML IM SOLN
60.0000 mg | Freq: Once | INTRAMUSCULAR | Status: AC
  Administered 2011-10-23: 60 mg via INTRAMUSCULAR

## 2011-10-23 MED ORDER — PREDNISONE 20 MG PO TABS: 40.0000 mg | ORAL_TABLET | Freq: Every day | ORAL | Status: DC

## 2011-10-23 MED ORDER — CYCLOBENZAPRINE HCL 10 MG PO TABS: 10.0000 mg | ORAL_TABLET | Freq: Every evening | ORAL | Status: DC | PRN

## 2011-10-23 NOTE — Progress Notes (Signed)
  Subjective:    Patient ID: Charlotte Hobbs, female    DOB: 1970-06-11, 41 y.o.   MRN: [id number]  HPI Low back pain for one week.  Went to UC yesterday and had xrays.  Given vicodin.   Shooting into his right leg down to the top of the foot.  Pain is 10/10. vicodin is not helping. Getting up and down is very painful. Better sitting or standing.  She says it does feel better when she brings her knees up to her chest. No other alleviating symptoms. She said today is actually been the worst day. That's why she came in today because it was worse. Xrays were fairly normal.    Review of Systems     Objective:   Physical Exam  Constitutional: She appears well-developed and well-nourished. She appears distressed.  Musculoskeletal:       Decreased flexion and extension. Normal rotation right and left. Normal side bending. Positive straight leg raise on the right. Patellar reflexes 2+ bilaterally. Hip flexion normal bilaterally.  Skin: Skin is warm and dry.  Psychiatric: She has a normal mood and affect. Her behavior is normal.          Assessment & Plan:  Low back pain with sciatica - given an injection of Toradol for acute pain relief. Also sent her prescription for Flexeril as a muscle relaxer that she can use at bedtime. I think it's important to get her in to physical therapy as soon as possible so they can start working with her to increase her mobility. Also discussed her in the prednisone with her. I think that the Vicodin is not even touching her pain we should try prednisone. Did warn about potential for mental status changes which is concerned about because she does have bipolar disorder. I encouraged her restart it if she feels like since changing her mood to stop immediately. Or at work for one week. She will have to drop off definitely before. At that point time we can followup in if she needs accommodations to get back to work well be happy to evaluate for this. Work note given for  today.

## 2011-10-23 NOTE — Patient Instructions (Signed)
Stop by the desk to get an appt for Physical therapy

## 2011-10-26 ENCOUNTER — Ambulatory Visit: Payer: 59 | Attending: Family Medicine | Admitting: Physical Therapy

## 2011-10-26 DIAGNOSIS — R262 Difficulty in walking, not elsewhere classified: Secondary | ICD-10-CM | POA: Insufficient documentation

## 2011-10-26 DIAGNOSIS — M545 Low back pain, unspecified: Secondary | ICD-10-CM | POA: Insufficient documentation

## 2011-10-26 DIAGNOSIS — IMO0001 Reserved for inherently not codable concepts without codable children: Secondary | ICD-10-CM | POA: Insufficient documentation

## 2011-10-26 DIAGNOSIS — M2550 Pain in unspecified joint: Secondary | ICD-10-CM | POA: Insufficient documentation

## 2011-10-27 ENCOUNTER — Ambulatory Visit: Payer: 59 | Admitting: Physical Therapy

## 2011-10-30 ENCOUNTER — Telehealth: Payer: Self-pay | Admitting: *Deleted

## 2011-10-30 ENCOUNTER — Ambulatory Visit: Payer: 59 | Admitting: Physical Therapy

## 2011-10-30 NOTE — Telephone Encounter (Signed)
Patient states her job is requesting a letter for work stating- limitations for working. [phone]

## 2011-10-31 ENCOUNTER — Ambulatory Visit (HOSPITAL_BASED_OUTPATIENT_CLINIC_OR_DEPARTMENT_OTHER)
Admission: RE | Admit: 2011-10-31 | Discharge: 2011-10-31 | Disposition: A | Discharge status: Home / Self Care | Payer: 59 | Source: Ambulatory Visit | Attending: Family Medicine | Admitting: Family Medicine

## 2011-10-31 ENCOUNTER — Encounter: Payer: Self-pay | Admitting: Family Medicine

## 2011-10-31 ENCOUNTER — Ambulatory Visit (INDEPENDENT_AMBULATORY_CARE_PROVIDER_SITE_OTHER): Payer: 59 | Admitting: Family Medicine

## 2011-10-31 VITALS — BP 141/100 | HR 123 | Wt 202.0 lb

## 2011-10-31 DIAGNOSIS — IMO0002 Reserved for concepts with insufficient information to code with codable children: Secondary | ICD-10-CM

## 2011-10-31 DIAGNOSIS — M48061 Spinal stenosis, lumbar region without neurogenic claudication: Secondary | ICD-10-CM | POA: Insufficient documentation

## 2011-10-31 DIAGNOSIS — M5416 Radiculopathy, lumbar region: Secondary | ICD-10-CM

## 2011-10-31 MED ORDER — OXYCODONE-ACETAMINOPHEN 7.5-325 MG PO TABS: 1.0000 | ORAL_TABLET | Freq: Three times a day (TID) | ORAL | Status: DC | PRN

## 2011-10-31 NOTE — Telephone Encounter (Signed)
What limitations is she asking for? I wrote her out completley until the 14th.  I know Dr. Ivan Anchors was ordering MRI on her.

## 2011-10-31 NOTE — Progress Notes (Signed)
CC: Charlotte Hobbs is a 41 y.o. female is here for Sciatica   Subjective: HPI:  Patient complains of worsening right leg pain. This initially started earlier this month somewhat gradually and is described as a searing pain in the right buttock that radiates down the posterior lateral aspect of the leg and stops just a little distal to the ankle. It has been associated with numbness of the right foot but no other motor or sensory disturbances. Is not not necessarily influenced by bearing weight but she does notice that she has to use a cane as her gait has been altered due to the pain. She denies weakness in the right leg. Interventions have included an unremarkable series of lumbar films about a week ago, prednisone which didn't provide much improvement, Vicodin which helped only with sleep, and home physical therapy formal physical therapy which the patient expresses was beneficial. 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  The pain is now a 10 out of 10 and "unbearable ". She has long-standing low back pain but denies saddle paresthesia nor bowel or bladder incontinence. Positions or movements do not particularly makes the pain better or worse. She denies recent trauma or overexertion.  Patient is requesting consideration of an MRI   Review Of Systems Outlined In HPI  Past Medical History  Diagnosis Date  . Depression   . Mania      Family History  Problem Relation Age of Onset  . Hypertension Father   . Heart attack Father 6  . Anxiety disorder Father   . Heart attack Paternal Grandfather 40     History  Substance Use Topics  . Smoking status: Current Some Day Smoker -- 0.5 packs/day for 25 years    Types: Cigarettes  . Smokeless tobacco: Not on file  . Alcohol Use: No     Objective: Filed Vitals:   10/31/11 1009  BP: 141/100  Pulse: 123    General: Alert and Oriented, No Acute Distress Lungs: Comfortable work of breathing. Good air movement. Cardiac: Regular rate and rhythm.  Extremities: No peripheral edema.  Strong peripheral pulses. Right lower extremity: Straight leg raise positive. No pain with internal or external rotation of the femur. No midline back pain. L4, S1 DTRs two over four bilaterally Babinski's downgoing. No compromised cutaneous dermatome when testing to light touch. Full strength in the hip knee and ankle of the right lower extremity however range of motion unable to be fully assessed due to patient's discomfort. Mental Status: No depression, anxiety, nor agitation. Skin: Warm and dry.  Assessment & Plan: Charlotte Hobbs was seen today for sciatica.  Diagnoses and associated orders for this visit:  Lumbar radiculopathy, acute - MR Lumbar Spine Wo Contrast; Future  Other Orders - oxyCODONE-acetaminophen (PERCOCET) 7.5-325 MG per tablet; Take 1 tablet by mouth every 8 (eight) hours as needed for pain.    Discussed with patient that her symptoms most consistent with sciatica and that I was optimistic that  continued physical therapy(twice a week) and her home exercise plan would provide continued help her improve. She expresses a strong interest in having an MRI to exclude disc bulge and given her symptom of numbness of the right foot this is appropriate but could wait if the patient preferred. Discussed that Percocet be used on help with sleep  studies have shown that this is not necessarily an effective medication for sciatica. I've written her out of work for the week.  Return in about 1 week (around 11/07/2011).

## 2011-11-01 ENCOUNTER — Telehealth: Payer: Self-pay | Admitting: Family Medicine

## 2011-11-01 ENCOUNTER — Ambulatory Visit: Payer: 59

## 2011-11-01 DIAGNOSIS — M5416 Radiculopathy, lumbar region: Secondary | ICD-10-CM | POA: Insufficient documentation

## 2011-11-01 NOTE — Telephone Encounter (Signed)
Sue Lush, Will you please let Charlotte Hobbs know that she does in fact have a bulging lumbar disc that's affecting the S1 and L5 nerve roots on the right.  I think physical therapy is still safe and a good plan provided it's not worsening her pain, but I'd like to offer a referral to a spine surgeon if she'd like.  Please let me know if she's interested in this, they would be able to further counsel her on treatment strategies beyond PT. Thank you.

## 2011-11-01 NOTE — Telephone Encounter (Signed)
Pt notified;pt would like to go ahead with referral to spine surgeon

## 2011-11-01 NOTE — Telephone Encounter (Signed)
Will do, thank you

## 2011-11-02 NOTE — Telephone Encounter (Signed)
Dr. Linford Arnold working on [employer]

## 2011-11-05 ENCOUNTER — Emergency Department (HOSPITAL_COMMUNITY)
Admission: EM | Admit: 2011-11-05 | Discharge: 2011-11-05 | Disposition: A | Discharge status: Home / Self Care | Payer: 59 | Attending: Emergency Medicine | Admitting: Emergency Medicine

## 2011-11-05 ENCOUNTER — Encounter (HOSPITAL_COMMUNITY): Payer: Self-pay | Admitting: *Deleted

## 2011-11-05 DIAGNOSIS — M5416 Radiculopathy, lumbar region: Secondary | ICD-10-CM

## 2011-11-05 DIAGNOSIS — Z79899 Other long term (current) drug therapy: Secondary | ICD-10-CM | POA: Insufficient documentation

## 2011-11-05 DIAGNOSIS — R5383 Other fatigue: Secondary | ICD-10-CM | POA: Insufficient documentation

## 2011-11-05 DIAGNOSIS — F329 Major depressive disorder, single episode, unspecified: Secondary | ICD-10-CM | POA: Insufficient documentation

## 2011-11-05 DIAGNOSIS — IMO0002 Reserved for concepts with insufficient information to code with codable children: Secondary | ICD-10-CM | POA: Insufficient documentation

## 2011-11-05 DIAGNOSIS — F3289 Other specified depressive episodes: Secondary | ICD-10-CM | POA: Insufficient documentation

## 2011-11-05 DIAGNOSIS — R5381 Other malaise: Secondary | ICD-10-CM | POA: Insufficient documentation

## 2011-11-05 MED ORDER — IBUPROFEN 600 MG PO TABS
600.0000 mg | ORAL_TABLET | Freq: Four times a day (QID) | ORAL | Status: DC | PRN
Start: 2011-11-05 — End: 2011-11-07

## 2011-11-05 MED ORDER — KETOROLAC TROMETHAMINE 30 MG/ML IJ SOLN
30.0000 mg | Freq: Once | INTRAMUSCULAR | Status: AC
  Administered 2011-11-05: 30 mg via INTRAMUSCULAR
  Filled 2011-11-05: qty 1

## 2011-11-05 MED ORDER — HYDROMORPHONE HCL PF 1 MG/ML IJ SOLN
1.0000 mg | Freq: Once | INTRAMUSCULAR | Status: AC
  Administered 2011-11-05: 1 mg via INTRAMUSCULAR
  Filled 2011-11-05: qty 1

## 2011-11-05 MED ORDER — OXYCODONE-ACETAMINOPHEN 5-325 MG PO TABS: 1.0000 | ORAL_TABLET | Freq: Four times a day (QID) | ORAL | Status: DC | PRN

## 2011-11-05 MED ORDER — PREDNISONE 20 MG PO TABS
60.0000 mg | ORAL_TABLET | Freq: Once | ORAL | Status: AC
  Administered 2011-11-05: 60 mg via ORAL
  Filled 2011-11-05: qty 3

## 2011-11-05 MED ORDER — PREDNISONE 20 MG PO TABS: ORAL_TABLET | ORAL | Status: DC

## 2011-11-05 MED ORDER — CYCLOBENZAPRINE HCL 10 MG PO TABS: 10.0000 mg | ORAL_TABLET | Freq: Two times a day (BID) | ORAL | Status: DC | PRN

## 2011-11-05 NOTE — ED Notes (Signed)
Pt reports right leg pain from hip to foot. At times 10/10. Denies numbness or tingling. Had a MRI Wednesday 10/16, found a pinched nerve. Pt reports right now the pain is affecter her ADLs.

## 2011-11-05 NOTE — ED Provider Notes (Signed)
History     CSN: [id number]  Arrival date & time 11/05/11  1732   First MD Initiated Contact with Patient 11/05/11 1831      Chief Complaint  Patient presents with  . Leg Pain    (Consider location/radiation/quality/duration/timing/severity/associated sxs/prior treatment) HPI Comments: Patient has had low back pain, radiating to her leg.  For a while.  She had an MRI recently, which showed she has a pinched nerve.  She has an appointment with Dr. Lovell Sheehan in 2 days, but she is currently without any pain control.  At home.  Pain is worse down anything.  She can do is lie on her side to relieve the discomfort.  She is having trouble getting to the bathroom, and sitting on the type but due to the pain.  She denies any numbness, incontinence, due to inability to feel.  Area  Patient is a 41 y.o. female presenting with leg pain. The history is provided by the patient.  Leg Pain  The incident occurred more than 1 week ago. The incident occurred at home. There was no injury mechanism. Pertinent negatives include no numbness.    Past Medical History  Diagnosis Date  . Depression   . Mania     Past Surgical History  Procedure Date  . Myomectomy 1996  . Cesarean section 2002    Family History  Problem Relation Age of Onset  . Hypertension Father   . Heart attack Father 24  . Anxiety disorder Father   . Heart attack Paternal Grandfather 40    History  Substance Use Topics  . Smoking status: Current Some Day Smoker -- 0.5 packs/day for 25 years    Types: Cigarettes  . Smokeless tobacco: Not on file  . Alcohol Use: No    OB History    Grav Para Term Preterm Abortions TAB SAB Ect Mult Living                  Review of Systems  Constitutional: Negative for fever and chills.  Genitourinary: Negative for dysuria, frequency, flank pain, vaginal discharge and vaginal pain.  Musculoskeletal: Positive for back pain.  Skin: Negative for wound.  Neurological: Positive for  weakness. Negative for numbness.    Allergies  Review of patient's allergies indicates no known allergies.  Home Medications   Current Outpatient Rx  Name Route Sig Dispense Refill  . BUPROPION HCL ER (XL) 300 MG PO TB24 Oral Take 1 tablet (300 mg total) by mouth daily. 30 tablet 2  . CYCLOBENZAPRINE HCL 10 MG PO TABS Oral Take 1 tablet (10 mg total) by mouth at bedtime as needed for muscle spasms. 30 tablet 0  . ETODOLAC 500 MG PO TABS Oral Take 500 mg by mouth 2 (two) times daily.    [name] HYDROCODONE-ACETAMINOPHEN 5-500 MG PO TABS Oral Take 1 tablet by mouth every 4 (four) hours as needed. Take 1 or 2 every 4-6 hours as needed for severe pain    . IBUPROFEN 200 MG PO TABS Oral Take 800 mg by mouth every 6 (six) hours as needed. pain    . LAMOTRIGINE 150 MG PO TABS Oral Take 1 tablet (150 mg total) by mouth 2 (two) times daily. 60 tablet 2  . OXYCODONE-ACETAMINOPHEN 7.5-325 MG PO TABS Oral Take 1 tablet by mouth every 8 (eight) hours as needed for pain. 20 tablet 0  . CYCLOBENZAPRINE HCL 10 MG PO TABS Oral Take 1 tablet (10 mg total) by mouth 2 (two) times daily  as needed for muscle spasms. 20 tablet 0  . IBUPROFEN 600 MG PO TABS Oral Take 1 tablet (600 mg total) by mouth every 6 (six) hours as needed for pain. 30 tablet 0  . OXYCODONE-ACETAMINOPHEN 5-325 MG PO TABS Oral Take 1-2 tablets by mouth every 6 (six) hours as needed for pain. 20 tablet 0  . PREDNISONE 20 MG PO TABS  3 Tabs PO Days 1-3, then 2 tabs PO Days 4-6, then 1 tab PO Day 7-9, then Half Tab PO Day 10-12 20 tablet 0    BP 137/104  Pulse 134  Temp 97.5 F (36.4 C) (Oral)  Resp 20  SpO2 100%  LMP 10/20/2011  Physical Exam  Constitutional: She appears well-developed.  HENT:  Head: Normocephalic.  Eyes: Pupils are equal, round, and reactive to light.  Neck: Normal range of motion.  Cardiovascular: Normal rate.   Musculoskeletal: Normal range of motion. She exhibits tenderness. She exhibits no edema.        Back:  Neurological: She is alert.    ED Course  Procedures (including critical care time)  Labs Reviewed - No data to display No results found.   1. Lumbar nerve root impingement       MDM  Exacerbation of/nerve.  We'll discharge him with pain control, including or ibuprofen.  A steroid taper, and have encouraged patient to keep her appointment on Tuesday with Dr. Lovell Sheehan Patient noted to be slightly tachycardic.  After pain is under control.  As 3/10, but she's had persistent episodes of tachycardia when previous office visit reviewed       Arman Filter, NP 11/05/11 1924  Arman Filter, NP 11/05/11 1924  Arman Filter, NP 11/05/11 1939

## 2011-11-06 ENCOUNTER — Ambulatory Visit: Payer: 59 | Admitting: Physical Therapy

## 2011-11-06 NOTE — ED Provider Notes (Signed)
Medical screening examination/treatment/procedure(s) were performed by non-physician practitioner and as supervising physician I was immediately available for consultation/collaboration.  Nichalas Coin R. Artist Bloom, MD 11/06/11 0013 

## 2011-11-07 ENCOUNTER — Other Ambulatory Visit: Payer: Self-pay | Admitting: Neurosurgery

## 2011-11-07 ENCOUNTER — Encounter (HOSPITAL_COMMUNITY): Payer: Self-pay | Admitting: Pharmacy Technician

## 2011-11-08 ENCOUNTER — Encounter: Payer: Self-pay | Admitting: Physical Therapy

## 2011-11-10 ENCOUNTER — Encounter (HOSPITAL_COMMUNITY)
Admission: RE | Admit: 2011-11-10 | Discharge: 2011-11-10 | Disposition: A | Discharge status: Home / Self Care | Payer: 59 | Source: Ambulatory Visit | Attending: Neurosurgery | Admitting: Neurosurgery

## 2011-11-10 ENCOUNTER — Encounter (HOSPITAL_COMMUNITY): Payer: Self-pay

## 2011-11-10 LAB — SURGICAL PCR SCREEN: MRSA, PCR: NEGATIVE

## 2011-11-10 LAB — BASIC METABOLIC PANEL
BUN: 13 mg/dL (ref 6–23)
CO2: 25 mEq/L (ref 19–32)
Calcium: 9.3 mg/dL (ref 8.4–10.5)
Creatinine, Ser: 0.69 mg/dL (ref 0.50–1.10)
Glucose, Bld: 96 mg/dL (ref 70–99)
Sodium: 134 mEq/L — ABNORMAL LOW (ref 135–145)

## 2011-11-10 LAB — CBC
HCT: 42.9 % (ref 36.0–46.0)
Hemoglobin: 15.2 g/dL — ABNORMAL HIGH (ref 12.0–15.0)
MCH: 32.2 pg (ref 26.0–34.0)
MCV: 90.9 fL (ref 78.0–100.0)
RBC: 4.72 MIL/uL (ref 3.87–5.11)

## 2011-11-10 NOTE — Pre-Procedure Instructions (Signed)
20 Charlotte Hobbs  11/10/2011   Your procedure is scheduled on:  11/13/11  Report to [facility] at 945 AM.  Call this number if you have problems the morning of surgery: [phone]   Remember:   Do not eat food:After Midnight.   Take these medicines the morning of surgery with A SIP OF WATER: WELLBUTRIN,OXYCODONE,LAMICTAL   Do not wear jewelry, make-up or nail polish.  Do not wear lotions, powders, or perfumes. You may wear deodorant.  Do not shave 48 hours prior to surgery. Men may shave face and neck.  Do not bring valuables to the hospital.  Contacts, dentures or bridgework may not be worn into surgery.  Leave suitcase in the car. After surgery it may be brought to your room.  For patients admitted to the hospital, checkout time is 11:00 AM the day of discharge.   Patients discharged the day of surgery will not be allowed to drive home.  Name and phone number of your driver: family  Special Instructions: Shower using CHG 2 nights before surgery and the night before surgery.  If you shower the day of surgery use CHG.  Use special wash - you have one bottle of CHG for all showers.  You should use approximately 1/3 of the bottle for each shower.   Please read over the following fact sheets that you were given: Pain Booklet, Coughing and Deep Breathing, MRSA Information and Surgical Site Infection Prevention

## 2011-11-12 MED ORDER — CEFAZOLIN SODIUM-DEXTROSE 2-3 GM-% IV SOLR
2.0000 g | INTRAVENOUS | Status: AC
  Administered 2011-11-13: 2 g via INTRAVENOUS
  Filled 2011-11-12: qty 50

## 2011-11-13 ENCOUNTER — Ambulatory Visit (HOSPITAL_COMMUNITY): Payer: 59 | Admitting: Certified Registered Nurse Anesthetist

## 2011-11-13 ENCOUNTER — Encounter (HOSPITAL_COMMUNITY)
Admission: RE | Disposition: A | Discharge status: Home / Self Care | Payer: Self-pay | Source: Ambulatory Visit | Attending: Neurosurgery

## 2011-11-13 ENCOUNTER — Ambulatory Visit (HOSPITAL_COMMUNITY): Payer: 59

## 2011-11-13 ENCOUNTER — Observation Stay (HOSPITAL_COMMUNITY)
Admission: RE | Admit: 2011-11-13 | Discharge: 2011-11-14 | Disposition: A | Discharge status: Home / Self Care | Payer: 59 | Source: Ambulatory Visit | Attending: Neurosurgery | Admitting: Neurosurgery

## 2011-11-13 ENCOUNTER — Encounter (HOSPITAL_COMMUNITY): Payer: Self-pay | Admitting: Certified Registered Nurse Anesthetist

## 2011-11-13 DIAGNOSIS — F17200 Nicotine dependence, unspecified, uncomplicated: Secondary | ICD-10-CM | POA: Insufficient documentation

## 2011-11-13 DIAGNOSIS — F319 Bipolar disorder, unspecified: Secondary | ICD-10-CM

## 2011-11-13 DIAGNOSIS — M5126 Other intervertebral disc displacement, lumbar region: Principal | ICD-10-CM | POA: Insufficient documentation

## 2011-11-13 DIAGNOSIS — Z01812 Encounter for preprocedural laboratory examination: Secondary | ICD-10-CM | POA: Insufficient documentation

## 2011-11-13 HISTORY — PX: LUMBAR LAMINECTOMY/DECOMPRESSION MICRODISCECTOMY: SHX5026

## 2011-11-13 SURGERY — LUMBAR LAMINECTOMY/DECOMPRESSION MICRODISCECTOMY 2 LEVELS
Anesthesia: General | Laterality: Right | Wound class: Clean

## 2011-11-13 MED ORDER — CEFAZOLIN SODIUM-DEXTROSE 2-3 GM-% IV SOLR
2.0000 g | Freq: Three times a day (TID) | INTRAVENOUS | Status: AC
  Administered 2011-11-13 – 2011-11-14 (×2): 2 g via INTRAVENOUS
  Filled 2011-11-13 (×2): qty 50

## 2011-11-13 MED ORDER — HEMOSTATIC AGENTS (NO CHARGE) OPTIME
TOPICAL | Status: DC | PRN
  Administered 2011-11-13: 1 via TOPICAL

## 2011-11-13 MED ORDER — MIDAZOLAM HCL 5 MG/5ML IJ SOLN
INTRAMUSCULAR | Status: DC | PRN
  Administered 2011-11-13: 2 mg via INTRAVENOUS

## 2011-11-13 MED ORDER — ARTIFICIAL TEARS OP OINT
TOPICAL_OINTMENT | OPHTHALMIC | Status: DC | PRN
  Administered 2011-11-13: 1 via OPHTHALMIC

## 2011-11-13 MED ORDER — ROCURONIUM BROMIDE 100 MG/10ML IV SOLN
INTRAVENOUS | Status: DC | PRN
  Administered 2011-11-13: 50 mg via INTRAVENOUS

## 2011-11-13 MED ORDER — THROMBIN 5000 UNITS EX SOLR
CUTANEOUS | Status: DC | PRN
  Administered 2011-11-13 (×2): 5000 [IU] via TOPICAL

## 2011-11-13 MED ORDER — INFLUENZA VIRUS VACC SPLIT PF IM SUSP
0.5000 mL | INTRAMUSCULAR | Status: AC
  Administered 2011-11-14: 0.5 mL via INTRAMUSCULAR
  Filled 2011-11-13: qty 0.5

## 2011-11-13 MED ORDER — MENTHOL 3 MG MT LOZG: 1.0000 | LOZENGE | OROMUCOSAL | Status: DC | PRN

## 2011-11-13 MED ORDER — ONDANSETRON HCL 4 MG/2ML IJ SOLN
INTRAMUSCULAR | Status: DC | PRN
  Administered 2011-11-13: 4 mg via INTRAVENOUS

## 2011-11-13 MED ORDER — DIAZEPAM 5 MG PO TABS: 5.0000 mg | ORAL_TABLET | Freq: Four times a day (QID) | ORAL | Status: DC | PRN

## 2011-11-13 MED ORDER — 0.9 % SODIUM CHLORIDE (POUR BTL) OPTIME
TOPICAL | Status: DC | PRN
  Administered 2011-11-13: 1000 mL

## 2011-11-13 MED ORDER — DOCUSATE SODIUM 100 MG PO CAPS
100.0000 mg | ORAL_CAPSULE | Freq: Two times a day (BID) | ORAL | Status: DC
  Administered 2011-11-13: 100 mg via ORAL
  Filled 2011-11-13: qty 1

## 2011-11-13 MED ORDER — LACTATED RINGERS IV SOLN
INTRAVENOUS | Status: DC
  Administered 2011-11-13: 20:00:00 via INTRAVENOUS

## 2011-11-13 MED ORDER — SODIUM CHLORIDE 0.9 % IR SOLN
Status: DC | PRN
  Administered 2011-11-13: 13:00:00

## 2011-11-13 MED ORDER — BACITRACIN 50000 UNITS IM SOLR
INTRAMUSCULAR | Status: AC
  Filled 2011-11-13: qty 1

## 2011-11-13 MED ORDER — HYDROCODONE-ACETAMINOPHEN 5-325 MG PO TABS: 1.0000 | ORAL_TABLET | ORAL | Status: DC | PRN

## 2011-11-13 MED ORDER — GLYCOPYRROLATE 0.2 MG/ML IJ SOLN
INTRAMUSCULAR | Status: DC | PRN
  Administered 2011-11-13: .6 mg via INTRAVENOUS

## 2011-11-13 MED ORDER — MORPHINE SULFATE 2 MG/ML IJ SOLN
1.0000 mg | INTRAMUSCULAR | Status: DC | PRN
  Administered 2011-11-13: 2 mg via INTRAVENOUS
  Filled 2011-11-13: qty 1

## 2011-11-13 MED ORDER — BUPROPION HCL ER (XL) 300 MG PO TB24
300.0000 mg | ORAL_TABLET | Freq: Every day | ORAL | Status: DC
  Filled 2011-11-13 (×2): qty 1

## 2011-11-13 MED ORDER — SODIUM CHLORIDE 0.9 % IV SOLN
INTRAVENOUS | Status: AC
  Filled 2011-11-13: qty 500

## 2011-11-13 MED ORDER — MUPIROCIN 2 % EX OINT
TOPICAL_OINTMENT | Freq: Two times a day (BID) | CUTANEOUS | Status: DC
  Administered 2011-11-13: 21:00:00 via NASAL
  Filled 2011-11-13: qty 22

## 2011-11-13 MED ORDER — MEPERIDINE HCL 25 MG/ML IJ SOLN: 6.2500 mg | INTRAMUSCULAR | Status: DC | PRN

## 2011-11-13 MED ORDER — BACITRACIN ZINC 500 UNIT/GM EX OINT
TOPICAL_OINTMENT | CUTANEOUS | Status: DC | PRN
  Administered 2011-11-13: 1 via TOPICAL

## 2011-11-13 MED ORDER — PHENOL 1.4 % MT LIQD: 1.0000 | OROMUCOSAL | Status: DC | PRN

## 2011-11-13 MED ORDER — ZOLPIDEM TARTRATE 5 MG PO TABS: 5.0000 mg | ORAL_TABLET | Freq: Every evening | ORAL | Status: DC | PRN

## 2011-11-13 MED ORDER — LAMOTRIGINE 150 MG PO TABS
150.0000 mg | ORAL_TABLET | Freq: Two times a day (BID) | ORAL | Status: DC
  Administered 2011-11-13: 150 mg via ORAL
  Filled 2011-11-13 (×3): qty 1

## 2011-11-13 MED ORDER — MIDAZOLAM HCL 2 MG/2ML IJ SOLN: 0.5000 mg | Freq: Once | INTRAMUSCULAR | Status: DC | PRN

## 2011-11-13 MED ORDER — NEOSTIGMINE METHYLSULFATE 1 MG/ML IJ SOLN
INTRAMUSCULAR | Status: DC | PRN
  Administered 2011-11-13: 4 mg via INTRAVENOUS

## 2011-11-13 MED ORDER — ACETAMINOPHEN 650 MG RE SUPP: 650.0000 mg | RECTAL | Status: DC | PRN

## 2011-11-13 MED ORDER — OXYCODONE HCL 5 MG PO TABS: 5.0000 mg | ORAL_TABLET | Freq: Once | ORAL | Status: DC | PRN

## 2011-11-13 MED ORDER — IBUPROFEN 800 MG PO TABS
800.0000 mg | ORAL_TABLET | Freq: Four times a day (QID) | ORAL | Status: DC | PRN
  Filled 2011-11-13: qty 1

## 2011-11-13 MED ORDER — OXYCODONE-ACETAMINOPHEN 5-325 MG PO TABS
1.0000 | ORAL_TABLET | ORAL | Status: DC | PRN
  Administered 2011-11-14: 2 via ORAL
  Filled 2011-11-13: qty 2

## 2011-11-13 MED ORDER — ACETAMINOPHEN 325 MG PO TABS: 650.0000 mg | ORAL_TABLET | ORAL | Status: DC | PRN

## 2011-11-13 MED ORDER — BUPIVACAINE-EPINEPHRINE PF 0.5-1:200000 % IJ SOLN
INTRAMUSCULAR | Status: DC | PRN
  Administered 2011-11-13: 10 mL

## 2011-11-13 MED ORDER — FENTANYL CITRATE 0.05 MG/ML IJ SOLN
INTRAMUSCULAR | Status: DC | PRN
  Administered 2011-11-13 (×2): 50 ug via INTRAVENOUS
  Administered 2011-11-13: 150 ug via INTRAVENOUS
  Administered 2011-11-13 (×2): 50 ug via INTRAVENOUS

## 2011-11-13 MED ORDER — LIDOCAINE HCL 4 % MT SOLN
OROMUCOSAL | Status: DC | PRN
  Administered 2011-11-13: 4 mL via TOPICAL

## 2011-11-13 MED ORDER — LACTATED RINGERS IV SOLN
INTRAVENOUS | Status: DC | PRN
  Administered 2011-11-13 (×2): via INTRAVENOUS

## 2011-11-13 MED ORDER — PROMETHAZINE HCL 25 MG/ML IJ SOLN: 6.2500 mg | INTRAMUSCULAR | Status: DC | PRN

## 2011-11-13 MED ORDER — LIDOCAINE HCL (CARDIAC) 20 MG/ML IV SOLN
INTRAVENOUS | Status: DC | PRN
  Administered 2011-11-13: 20 mg via INTRAVENOUS

## 2011-11-13 MED ORDER — PROPOFOL 10 MG/ML IV BOLUS
INTRAVENOUS | Status: DC | PRN
  Administered 2011-11-13: 200 mg via INTRAVENOUS

## 2011-11-13 MED ORDER — HYDROMORPHONE HCL PF 1 MG/ML IJ SOLN: 0.2500 mg | INTRAMUSCULAR | Status: DC | PRN

## 2011-11-13 MED ORDER — DEXAMETHASONE SODIUM PHOSPHATE 4 MG/ML IJ SOLN
INTRAMUSCULAR | Status: DC | PRN
  Administered 2011-11-13: 4 mg via INTRAVENOUS

## 2011-11-13 MED ORDER — OXYCODONE HCL 5 MG/5ML PO SOLN: 5.0000 mg | Freq: Once | ORAL | Status: DC | PRN

## 2011-11-13 MED ORDER — ONDANSETRON HCL 4 MG/2ML IJ SOLN: 4.0000 mg | INTRAMUSCULAR | Status: DC | PRN

## 2011-11-13 MED ORDER — VECURONIUM BROMIDE 10 MG IV SOLR
INTRAVENOUS | Status: DC | PRN
  Administered 2011-11-13: 1 mg via INTRAVENOUS
  Administered 2011-11-13: 2 mg via INTRAVENOUS
  Administered 2011-11-13: 1 mg via INTRAVENOUS

## 2011-11-13 SURGICAL SUPPLY — 58 items
APL SKNCLS STERI-STRIP NONHPOA (GAUZE/BANDAGES/DRESSINGS) ×1
BAG DECANTER FOR FLEXI CONT (MISCELLANEOUS) ×2 IMPLANT
BENZOIN TINCTURE PRP APPL 2/3 (GAUZE/BANDAGES/DRESSINGS) ×2 IMPLANT
BLADE SURG ROTATE 9660 (MISCELLANEOUS) IMPLANT
BRUSH SCRUB EZ PLAIN DRY (MISCELLANEOUS) ×2 IMPLANT
BUR ACORN 6.0 (BURR) ×2 IMPLANT
BUR MATCHSTICK NEURO 3.0 LAGG (BURR) ×2 IMPLANT
CANISTER SUCTION 2500CC (MISCELLANEOUS) ×2 IMPLANT
CLOTH BEACON ORANGE TIMEOUT ST (SAFETY) ×2 IMPLANT
CONT SPEC 4OZ CLIKSEAL STRL BL (MISCELLANEOUS) ×2 IMPLANT
DRAPE LAPAROTOMY 100X72X124 (DRAPES) ×2 IMPLANT
DRAPE MICROSCOPE LEICA (MISCELLANEOUS) ×2 IMPLANT
DRAPE POUCH INSTRU U-SHP 10X18 (DRAPES) ×2 IMPLANT
DRAPE SURG 17X23 STRL (DRAPES) ×8 IMPLANT
ELECT BLADE 4.0 EZ CLEAN MEGAD (MISCELLANEOUS) ×2
ELECT REM PT RETURN 9FT ADLT (ELECTROSURGICAL) ×2
ELECTRODE BLDE 4.0 EZ CLN MEGD (MISCELLANEOUS) ×1 IMPLANT
ELECTRODE REM PT RTRN 9FT ADLT (ELECTROSURGICAL) ×1 IMPLANT
GAUZE SPONGE 4X4 16PLY XRAY LF (GAUZE/BANDAGES/DRESSINGS) ×1 IMPLANT
GLOVE BIO SURGEON STRL SZ8 (GLOVE) ×1 IMPLANT
GLOVE BIO SURGEON STRL SZ8.5 (GLOVE) ×2 IMPLANT
GLOVE BIOGEL PI IND STRL 7.0 (GLOVE) ×1 IMPLANT
GLOVE BIOGEL PI IND STRL 7.5 (GLOVE) IMPLANT
GLOVE BIOGEL PI INDICATOR 7.0 (GLOVE) ×1
GLOVE BIOGEL PI INDICATOR 7.5 (GLOVE) ×1
GLOVE EXAM NITRILE LRG STRL (GLOVE) IMPLANT
GLOVE EXAM NITRILE MD LF STRL (GLOVE) IMPLANT
GLOVE EXAM NITRILE XL STR (GLOVE) IMPLANT
GLOVE EXAM NITRILE XS STR PU (GLOVE) IMPLANT
GLOVE SS BIOGEL STRL SZ 8 (GLOVE) ×1 IMPLANT
GLOVE SUPERSENSE BIOGEL SZ 8 (GLOVE) ×1
GOWN BRE IMP SLV AUR LG STRL (GOWN DISPOSABLE) IMPLANT
GOWN BRE IMP SLV AUR XL STRL (GOWN DISPOSABLE) ×2 IMPLANT
GOWN STRL REIN 2XL LVL4 (GOWN DISPOSABLE) IMPLANT
KIT BASIN OR (CUSTOM PROCEDURE TRAY) ×2 IMPLANT
KIT ROOM TURNOVER OR (KITS) ×2 IMPLANT
NDL HYPO 21X1.5 SAFETY (NEEDLE) IMPLANT
NDL HYPO 25X1 1.5 SAFETY (NEEDLE) IMPLANT
NEEDLE HYPO 21X1.5 SAFETY (NEEDLE) IMPLANT
NEEDLE HYPO 22GX1.5 SAFETY (NEEDLE) ×2 IMPLANT
NEEDLE HYPO 25X1 1.5 SAFETY (NEEDLE) ×2 IMPLANT
NS IRRIG 1000ML POUR BTL (IV SOLUTION) ×2 IMPLANT
PACK LAMINECTOMY NEURO (CUSTOM PROCEDURE TRAY) ×2 IMPLANT
PAD ARMBOARD 7.5X6 YLW CONV (MISCELLANEOUS) ×6 IMPLANT
PATTIES SURGICAL .5 X1 (DISPOSABLE) IMPLANT
RUBBERBAND STERILE (MISCELLANEOUS) ×4 IMPLANT
SPONGE GAUZE 4X4 12PLY (GAUZE/BANDAGES/DRESSINGS) ×2 IMPLANT
SPONGE SURGIFOAM ABS GEL SZ50 (HEMOSTASIS) ×2 IMPLANT
STRIP CLOSURE SKIN 1/2X4 (GAUZE/BANDAGES/DRESSINGS) ×2 IMPLANT
SUT VIC AB 1 CT1 18XBRD ANBCTR (SUTURE) ×2 IMPLANT
SUT VIC AB 1 CT1 8-18 (SUTURE) ×4
SUT VIC AB 2-0 CP2 18 (SUTURE) ×4 IMPLANT
SYR 20CC LL (SYRINGE) IMPLANT
SYR 20ML ECCENTRIC (SYRINGE) ×2 IMPLANT
SYR CONTROL 10ML LL (SYRINGE) ×2 IMPLANT
TOWEL OR 17X24 6PK STRL BLUE (TOWEL DISPOSABLE) ×2 IMPLANT
TOWEL OR 17X26 10 PK STRL BLUE (TOWEL DISPOSABLE) ×2 IMPLANT
WATER STERILE IRR 1000ML POUR (IV SOLUTION) ×2 IMPLANT

## 2011-11-13 NOTE — Anesthesia Preprocedure Evaluation (Signed)
Anesthesia Evaluation  Patient identified by MRN, date of birth, ID band Patient awake    Reviewed: Allergy & Precautions, H&P , NPO status , Patient's Chart, lab work & pertinent test results  History of Anesthesia Complications Negative for: history of anesthetic complications  Airway Mallampati: I TM Distance: >3 FB Neck ROM: Full    Dental  (+) Dental Advisory Given, Missing and Chipped   Pulmonary Current Smoker,  breath sounds clear to auscultation  Pulmonary exam normal       Cardiovascular negative cardio ROS  Rhythm:Regular Rate:Normal     Neuro/Psych PSYCHIATRIC DISORDERS Depression Chronic back pain: narcotics daily  Neuromuscular disease    GI/Hepatic negative GI ROS, Neg liver ROS,   Endo/Other  Morbid obesity  Renal/GU negative Renal ROS     Musculoskeletal   Abdominal (+) + obese,   Peds  Hematology   Anesthesia Other Findings   Reproductive/Obstetrics LMP 3 weeks ago: preg test NEG 11/10/11                           Anesthesia Physical Anesthesia Plan  ASA: II  Anesthesia Plan: General   Post-op Pain Management:    Induction: Intravenous  Airway Management Planned: Oral ETT  Additional Equipment:   Intra-op Plan:   Post-operative Plan: Extubation in OR  Informed Consent: I have reviewed the patients History and Physical, chart, labs and discussed the procedure including the risks, benefits and alternatives for the proposed anesthesia with the patient or authorized representative who has indicated his/her understanding and acceptance.   Dental advisory given  Plan Discussed with: CRNA and Surgeon  Anesthesia Plan Comments: (Plan routine monitors, GETA)        Anesthesia Quick Evaluation

## 2011-11-13 NOTE — Preoperative (Signed)
Beta Blockers   Reason not to administer Beta Blockers:Not Applicable 

## 2011-11-13 NOTE — H&P (Signed)
Subjective: The patient is a 41 year old white female who has complained of back and right leg pain consistent with a lumbar radiculopathy. She has failed medical management and was worked up with a lumbar MRI. This demonstrated a herniated disc on the right at L4-5 and L5-S1. I discussed the various treatment options with the patient including surgery. The patient has weighed the risks, benefits, and alternatives surgery and decided proceed with a right L4-5 and L5-S1 discectomy.   Past Medical History  Diagnosis Date  . Depression     bi polar  . Mania     Past Surgical History  Procedure Date  . Myomectomy 1996  . Cesarean section 2002    No Known Allergies  History  Substance Use Topics  . Smoking status: Current Some Day Smoker -- 0.5 packs/day for 25 years    Types: Cigarettes  . Smokeless tobacco: Not on file  . Alcohol Use: No    Family History  Problem Relation Age of Onset  . Hypertension Father   . Heart attack Father 61  . Anxiety disorder Father   . Heart attack Paternal Grandfather 40   Prior to Admission medications   Medication Sig Start Date End Date Taking? Authorizing Provider  buPROPion (WELLBUTRIN XL) 300 MG 24 hr tablet Take 1 tablet (300 mg total) by mouth daily. 09/06/11  Yes Cleotis Nipper, MD  cyclobenzaprine (FLEXERIL) 10 MG tablet Take 1 tablet (10 mg total) by mouth 2 (two) times daily as needed for muscle spasms. 11/05/11  Yes Arman Filter, NP  ibuprofen (ADVIL,MOTRIN) 200 MG tablet Take 800 mg by mouth every 6 (six) hours as needed. For pain   Yes Historical Provider, MD  lamoTRIgine (LAMICTAL) 150 MG tablet Take 1 tablet (150 mg total) by mouth 2 (two) times daily. 09/06/11  Yes Cleotis Nipper, MD  oxyCODONE-acetaminophen (PERCOCET/ROXICET) 5-325 MG per tablet Take 1-2 tablets by mouth every 6 (six) hours as needed for pain. 11/05/11  Yes Arman Filter, NP     Review of Systems  Positive ROS: As above  All other systems have been reviewed and  were otherwise negative with the exception of those mentioned in the HPI and as above.  Objective: Vital signs in last 24 hours: Temp:  [98.2 F (36.8 C)] 98.2 F (36.8 C) (10/28 0948) Pulse Rate:  [119] 119  (10/28 0948) Resp:  [20] 20  (10/28 0948) BP: (135)/(86) 135/86 mmHg (10/28 0948) SpO2:  [94 %] 94 % (10/28 0948)  General Appearance: Alert, cooperative, no distress, appears stated age Head: Normocephalic, without obvious abnormality, atraumatic Eyes: PERRL, conjunctiva/corneas clear, EOM's intact, fundi benign, both eyes      Ears: Normal TM's and external ear canals, both ears Throat: Lips, mucosa, and tongue normal; teeth and gums normal Neck: Supple, symmetrical, trachea midline, no adenopathy; thyroid: No enlargement/tenderness/nodules; no carotid bruit or JVD Back: Symmetric, no curvature, ROM normal, no CVA tenderness Lungs: Clear to auscultation bilaterally, respirations unlabored Heart: Regular rate and rhythm, S1 and S2 normal, no murmur, rub or gallop Abdomen: Soft, non-tender, bowel sounds active all four quadrants, no masses, no organomegaly Extremities: Extremities normal, atraumatic, no cyanosis or edema Pulses: 2+ and symmetric all extremities Skin: Skin color, texture, turgor normal, no rashes or lesions  NEUROLOGIC:   Mental status: alert and oriented, no aphasia, good attention span, Fund of knowledge/ memory ok Motor Exam - grossly normal Sensory Exam - grossly normal Reflexes:  Coordination - grossly normal Gait - grossly normal  Balance - grossly normal Cranial Nerves: I: smell Not tested  II: visual acuity  OS: Normal    OD: Normal   II: visual fields Full to confrontation  II: pupils Equal, round, reactive to light  III,VII: ptosis None  III,IV,VI: extraocular muscles  Full ROM  V: mastication Normal  V: facial light touch sensation  Normal  V,VII: corneal reflex  Present  VII: facial muscle function - upper  Normal  VII: facial muscle  function - lower Normal  VIII: hearing Not tested  IX: soft palate elevation  Normal  IX,X: gag reflex Present  XI: trapezius strength  5/5  XI: sternocleidomastoid strength 5/5  XI: neck flexion strength  5/5  XII: tongue strength  Normal    Data Review Lab Results  Component Value Date   WBC 10.3 11/10/2011   HGB 15.2* 11/10/2011   HCT 42.9 11/10/2011   MCV 90.9 11/10/2011   PLT 236 11/10/2011   Lab Results  Component Value Date   NA 134* 11/10/2011   K 4.7 11/10/2011   CL 98 11/10/2011   CO2 25 11/10/2011   BUN 13 11/10/2011   CREATININE 0.69 11/10/2011   GLUCOSE 96 11/10/2011   No results found for this basename: INR, PROTIME    Assessment/Plan: Right L4-5 and L5-S1 herniated disc, lumbar radicular, lumbago: I discussed situation with the patient. I reviewed her MR scan with her and pointed out the abnormalities. We have discussed the various treatment options including a right L4-5 and L5-S1 discectomy. I have described the surgery to her. I have shown her surgical models. We have discussed the risks, benefits, alternatives and likelihood of achieving our goals with surgery. I have answered all the patient's questions. She has decided proceed with surgery.   Yaa Donnellan D 11/13/2011 1:02 PM

## 2011-11-13 NOTE — Progress Notes (Signed)
Patient ID: Charlotte Hobbs, female   DOB: [birth year], 41 y.o.   MRN: [id number] Subjective:  The patient is alert and pleasant. She looks well.  Objective: Vital signs in last 24 hours: Temp:  [98.1 F (36.7 C)-98.2 F (36.8 C)] 98.1 F (36.7 C) (10/28 1509) Pulse Rate:  [119] 119  (10/28 0948) Resp:  [20] 20  (10/28 0948) BP: (135)/(86) 135/86 mmHg (10/28 0948) SpO2:  [94 %-97 %] 97 % (10/28 1515)  Intake/Output from previous day:   Intake/Output this shift: Total I/O In: 1700 [I.V.:1700] Out: 100 [Blood:100]  Physical exam the patient is moving her lower extremities well.  Lab Results: No results found for this basename: WBC:2,HGB:2,HCT:2,PLT:2 in the last 72 hours BMET No results found for this basename: NA:2,K:2,CL:2,CO2:2,GLUCOSE:2,BUN:2,CREATININE:2,CALCIUM:2 in the last 72 hours  Studies/Results: No results found.  Assessment/Plan: The patient is doing well.  LOS: 0 days     Charlotte Hobbs D 11/13/2011, 3:27 PM

## 2011-11-13 NOTE — Plan of Care (Signed)
Problem: Consults Goal: Diagnosis - Spinal Surgery Outcome: Completed/Met Date Met:  11/13/11 Lumbar Laminectomy (Complex)     

## 2011-11-13 NOTE — Op Note (Signed)
Brief history: The patient is a 41 year old white female who has suffered from back and right leg pain consistent with a lumbar radiculopathy. She has failed medical management and was worked up with a lumbar MRI. This demonstrated a herniated disc at L4-5 and L5-S1 on the right. I discussed the various treatment options with the patient including surgery. She has weighed the risks, benefits, and alternatives surgery and decided proceed with a right L4-5 and L5-S1 discectomy.  Preoperative diagnosis: Right L4-5 and L5-S1 herniated disc, spinal stenosis, lumbar radiculopathy, discogenic, lumbago  Postoperative diagnosis: The same  Procedure: Right L4-5 and right L5-S1 Intervertebral discectomy using micro-dissection  Surgeon: Dr. Delma Officer  Asst.: Dr. Jillyn Hidden cram  Anesthesia: Gen. endotracheal  Estimated blood loss: 75 cc  Drains: None  Complications: None  Description of procedure: The patient was brought to the operating room by the anesthesia team. General endotracheal anesthesia was induced. The patient was turned to the prone position on the Wilson frame. The patient's lumbosacral region was then prepared with Betadine scrub and Betadine solution. Sterile drapes were applied.  I then injected the area to be incised with Marcaine with epinephrine solution. I then used a scalpel to make a linear midline incision over the L4-5 and L5-S1 intervertebral disc space. I then used electrocautery to perform a right sided subperiosteal dissection exposing the spinous process and lamina of L4, L5 and the upper sacrum. We obtained intraoperative radiograph to confirm our location. I then inserted the [facility] retractor for exposure.  We then brought the operative microscope into the field. Under its magnification and illumination we completed the microdissection. I used a high-speed drill to perform a laminotomy at L4 and L5 on the right. I then used a Kerrison punches to widen the laminotomy and  removed the ligamentum flavum at L4-5 and L5-S1 on the right. We then used microdissection to free up the thecal sac and the right L5 and right S1 nerve root from the epidural tissue. I then used a Kerrison punch to perform a foraminotomy at about the right L5 and right S1 nerve root. We then using the nerve root retractor to gently retract the thecal sac and the right L5 nerve root medially. This exposed the intervertebral disc. The disc at this level was bulging but not herniated we therefore did not perform a discectomy this level. We performed a foraminotomy about the right L5 nerve root.  We then retracted the thecal sac and the S1 nerve or medially. This demonstrated a herniated disc which had migrated over the cephalad S1 vertebral body We identified the ruptured disc and remove it with the pituitary forceps. We inspected the intervertebral disc at L5-S1. There was a small hole in the annulus. There did not appear to be any impending herniations. We therefore did not enter into the intervertebral disc space.  I then palpated along the ventral surface of the thecal sac and along exit route of the right L5 and right S1 nerve root and noted that the neural structures were well decompressed. This completed the decompression.  We then obtained hemostasis using bipolar electrocautery. We irrigated the wound out with bacitracin solution. We then removed the retractor. We then reapproximated the patient's thoracolumbar fascia with interrupted #1 Vicryl suture. We then reapproximated the patient's subcutaneous tissue with interrupted 3-0 Vicryl suture. We then reapproximated patient's skin with Steri-Strips and benzoin. The was then coated with bacitracin ointment. The drapes were removed. The patient was subsequently returned to the supine position where they  were extubated by the anesthesia team. The patient was then transported to the postanesthesia care unit in stable condition. All sponge instrument and  needle counts were reportedly correct at the end of this case.

## 2011-11-13 NOTE — Anesthesia Postprocedure Evaluation (Signed)
  Anesthesia Post-op Note  Patient: Charlotte Hobbs  Procedure(s) Performed: Procedure(s) (LRB) with comments: LUMBAR LAMINECTOMY/DECOMPRESSION MICRODISCECTOMY 2 LEVELS (Right) - RIGHT Lumbar four-five Lumbar five sacral one diskectomy  Patient Location: PACU  Anesthesia Type:General  Level of Consciousness: awake, alert , oriented and patient cooperative  Airway and Oxygen Therapy: Patient Spontanous Breathing and Patient connected to nasal cannula oxygen  Post-op Pain: none  Post-op Assessment: Post-op Vital signs reviewed, Patient's Cardiovascular Status Stable, Respiratory Function Stable, Patent Airway, No signs of Nausea or vomiting and Pain level controlled  Post-op Vital Signs: Reviewed and stable  Complications: No apparent anesthesia complications

## 2011-11-13 NOTE — Transfer of Care (Signed)
Immediate Anesthesia Transfer of Care Note  Patient: Charlotte Hobbs  Procedure(s) Performed: Procedure(s) (LRB) with comments: LUMBAR LAMINECTOMY/DECOMPRESSION MICRODISCECTOMY 2 LEVELS (Right) - RIGHT Lumbar four-five Lumbar five sacral one diskectomy  Patient Location: PACU  Anesthesia Type:General  Level of Consciousness: awake and alert   Airway & Oxygen Therapy: Patient Spontanous Breathing and Patient connected to nasal cannula oxygen  Post-op Assessment: Report given to PACU RN, Post -op Vital signs reviewed and stable and Patient moving all extremities  Post vital signs: Reviewed and stable  Complications: No apparent anesthesia complications

## 2011-11-14 MED ORDER — DIAZEPAM 5 MG PO TABS: 5.0000 mg | ORAL_TABLET | Freq: Four times a day (QID) | ORAL | Status: DC | PRN

## 2011-11-14 MED ORDER — DSS 100 MG PO CAPS: 100.0000 mg | ORAL_CAPSULE | Freq: Two times a day (BID) | ORAL | Status: DC

## 2011-11-14 MED ORDER — OXYCODONE-ACETAMINOPHEN 5-325 MG PO TABS: 1.0000 | ORAL_TABLET | ORAL | Status: DC | PRN

## 2011-11-14 NOTE — Progress Notes (Signed)
Utilization review completed. Mario Voong, RN, BSN. 

## 2011-11-14 NOTE — Discharge Summary (Signed)
Physician Discharge Summary  Patient ID: Charlotte Hobbs MRN: [id number] DOB/AGE: 41-Nov-1972 41 y.o.  Admit date: 11/13/2011 Discharge date: 11/14/2011  Admission Diagnoses: Right L4-5 and L5-S1 herniated disc, lumbago, lumbar radiculopathy, lumbar spinal stenosis.  Discharge Diagnoses: The same Principal Problem:  *Lumbar herniated disc   Discharged Condition: good  Hospital Course: I admitted the patient to [facility] on 11/05/2011. On that day I performed a right L4-5 and L5-S1 discectomy. The surgery went well.  The patient's postoperative course was unremarkable. On postop day #1 the patient requested discharge to home. The patient was given oral and written discharge instructions. All her questions were answered.  Consults: None Significant Diagnostic Studies: None Treatments: Right L4-5 and L5-S1 discectomy using microdissection. Discharge Exam: Blood pressure 116/69, pulse 112, temperature 98.8 F (37.1 C), temperature source Oral, resp. rate 18, last menstrual period 10/20/2011, SpO2 95.00%. The patient is alert and oriented. She looks well. Her strength is grossly normal in her lower extremities.  Disposition: Home  Discharge Orders    Future Orders Please Complete By Expires   Diet - low sodium heart healthy      Increase activity slowly      Discharge instructions      Comments:   Call [phone] for a followup appointment.   Remove dressing in 48 hours      Call MD for:  temperature >100.4      Call MD for:  persistant nausea and vomiting      Call MD for:  severe uncontrolled pain      Call MD for:  redness, tenderness, or signs of infection (pain, swelling, redness, odor or green/yellow discharge around incision site)      Call MD for:  difficulty breathing, headache or visual disturbances      Call MD for:  hives      Call MD for:  persistant dizziness or light-headedness      Call MD for:  extreme fatigue          Medication List     As  of 11/14/2011  7:54 AM    STOP taking these medications         cyclobenzaprine 10 MG tablet   Commonly known as: FLEXERIL      TAKE these medications         buPROPion 300 MG 24 hr tablet   Commonly known as: WELLBUTRIN XL   Take 1 tablet (300 mg total) by mouth daily.      diazepam 5 MG tablet   Commonly known as: VALIUM   Take 1 tablet (5 mg total) by mouth every 6 (six) hours as needed.      DSS 100 MG Caps   Take 100 mg by mouth 2 (two) times daily.      ibuprofen 200 MG tablet   Commonly known as: ADVIL,MOTRIN   Take 800 mg by mouth every 6 (six) hours as needed. For pain      lamoTRIgine 150 MG tablet   Commonly known as: LAMICTAL   Take 1 tablet (150 mg total) by mouth 2 (two) times daily.      oxyCODONE-acetaminophen 5-325 MG per tablet   Commonly known as: PERCOCET/ROXICET   Take 1-2 tablets by mouth every 6 (six) hours as needed for pain.      oxyCODONE-acetaminophen 5-325 MG per tablet   Commonly known as: PERCOCET/ROXICET   Take 1-2 tablets by mouth every 4 (four) hours as needed.  SignedCristi Loron 11/14/2011, 7:54 AM

## 2011-11-16 ENCOUNTER — Encounter (HOSPITAL_COMMUNITY): Payer: Self-pay | Admitting: Neurosurgery

## 2011-12-08 ENCOUNTER — Encounter (HOSPITAL_COMMUNITY): Payer: Self-pay | Admitting: Psychiatry

## 2011-12-08 ENCOUNTER — Ambulatory Visit (HOSPITAL_COMMUNITY): Payer: 59 | Admitting: Psychiatry

## 2011-12-08 DIAGNOSIS — F39 Unspecified mood [affective] disorder: Secondary | ICD-10-CM

## 2011-12-08 DIAGNOSIS — F319 Bipolar disorder, unspecified: Secondary | ICD-10-CM

## 2011-12-08 MED ORDER — BUPROPION HCL ER (XL) 300 MG PO TB24: 300.0000 mg | ORAL_TABLET | Freq: Every day | ORAL | Status: DC

## 2011-12-08 MED ORDER — LAMOTRIGINE 150 MG PO TABS: 150.0000 mg | ORAL_TABLET | Freq: Two times a day (BID) | ORAL | Status: DC

## 2011-12-08 NOTE — Progress Notes (Signed)
Chief complaint Medication management and followup  History of presenting illness Patient is 41 year old Caucasian employed female who came for her followup appointment.  She has not going to work for past few weeks .  Patient has recently procedure which went well.  Patient also having chemical peel offer a skin .  She wants to wait when procedure completely done.  Overall she is doing better on her medication.  She was given pain medication and Valium however she is not taking it.  She denies any irritability agitation anger mood swing.  She sleep good.  She's not drinking or using any illegal substance.  She does not have any rash or itching with Lamictal.  Current psychiatric medication Lamictal 150 mg 2 tablet daily Wellbutrin XL 300 mg daily  Past psychiatric history Patient has a persistent psychiatric inpatient treatment or suicidal attempt.  Medical history Obesity.  She see Dr. Linford Arnold in [place].    Mental status examination Patient is mildly obese female who is casually dressed and fairly groomed.  She has dissolving rash which is do to chemical peel .  She is pleasant and cooperative.  She described her mood is anxious and her affect is mood appropriate.  She denies any active or passive suicidal thoughts or homicidal thoughts but there were no delusion or psychotic symptoms present.  Her attention and concentration is good.  She's alert and oriented x3.  Her insight judgment and pulse control is okay.  Assessment Axis I mood disorder NOS rule out bipolar disorder 1 Axis II deferred Axis III see medical history Axis IV mild to moderate  Plan I will continue her current psychiatric medication.  She is scheduled to see her primary care physician on December 4th for annual checkup and blood work.  I will see her in 3 months.

## 2012-03-11 ENCOUNTER — Ambulatory Visit (HOSPITAL_COMMUNITY): Payer: Self-pay | Admitting: Psychiatry

## 2012-03-12 ENCOUNTER — Ambulatory Visit (HOSPITAL_COMMUNITY): Payer: Self-pay | Admitting: Psychiatry

## 2012-04-09 ENCOUNTER — Other Ambulatory Visit (HOSPITAL_COMMUNITY): Payer: Self-pay | Admitting: *Deleted

## 2012-04-10 ENCOUNTER — Other Ambulatory Visit (HOSPITAL_COMMUNITY): Payer: Self-pay | Admitting: *Deleted

## 2012-04-10 DIAGNOSIS — F319 Bipolar disorder, unspecified: Secondary | ICD-10-CM

## 2012-04-10 MED ORDER — BUPROPION HCL ER (XL) 300 MG PO TB24: 300.0000 mg | ORAL_TABLET | Freq: Every day | ORAL | Status: DC

## 2012-04-29 ENCOUNTER — Telehealth (HOSPITAL_COMMUNITY): Payer: Self-pay | Admitting: *Deleted

## 2012-04-29 DIAGNOSIS — F319 Bipolar disorder, unspecified: Secondary | ICD-10-CM

## 2012-04-29 NOTE — Telephone Encounter (Signed)
BJ:YNWG filled her Bupropion last week.Now cannot find the bottle anywhere. Wants to know if she can have it replaced

## 2012-04-30 ENCOUNTER — Other Ambulatory Visit (HOSPITAL_COMMUNITY): Payer: Self-pay | Admitting: *Deleted

## 2012-04-30 DIAGNOSIS — F319 Bipolar disorder, unspecified: Secondary | ICD-10-CM

## 2012-04-30 MED ORDER — BUPROPION HCL ER (XL) 150 MG PO TB24: 300.0000 mg | ORAL_TABLET | Freq: Every day | ORAL | Status: DC

## 2012-04-30 MED ORDER — BUPROPION HCL ER (XL) 300 MG PO TB24: 300.0000 mg | ORAL_TABLET | Freq: Every day | ORAL | Status: DC

## 2012-04-30 NOTE — Telephone Encounter (Signed)
Dr.Tadepalli approved early refill/replacemet RX in Dr.Arfeen's absence

## 2012-04-30 NOTE — Telephone Encounter (Signed)
RX approved by Dr.Tadepalli printed in error. Called to pharmacy @ 1551, Pharmacist Lannette Donath states no 300 mg available due to manufacturer shortage.Can substitute 150 mg, 2 daily to equal dose

## 2012-04-30 NOTE — Addendum Note (Signed)
Addended by: Tonny Bollman on: 04/30/2012 01:47 PM   Modules accepted: Orders

## 2012-05-01 ENCOUNTER — Telehealth (HOSPITAL_COMMUNITY): Payer: Self-pay | Admitting: *Deleted

## 2012-05-01 DIAGNOSIS — F319 Bipolar disorder, unspecified: Secondary | ICD-10-CM

## 2012-05-01 MED ORDER — BUPROPION HCL ER (XL) 300 MG PO TB24
300.0000 mg | ORAL_TABLET | Freq: Every day | ORAL | Status: DC
Start: 2012-05-01 — End: 2012-05-17

## 2012-05-01 NOTE — Telephone Encounter (Signed)
Kacie in pharmacy called.Phamracy able to refillWellbutrin XL with 300mg  tabletsonce daily  after all, did not use WEllbutrin XL 150 mg 2 tablets daily as originally thought.Wanted to alert MD to update pt records. Medication records  updated

## 2012-05-17 ENCOUNTER — Ambulatory Visit (INDEPENDENT_AMBULATORY_CARE_PROVIDER_SITE_OTHER): Payer: 59 | Admitting: Psychiatry

## 2012-05-17 ENCOUNTER — Encounter (HOSPITAL_COMMUNITY): Payer: Self-pay | Admitting: Psychiatry

## 2012-05-17 ENCOUNTER — Telehealth (HOSPITAL_COMMUNITY): Payer: Self-pay

## 2012-05-17 VITALS — BP 128/87 | HR 87

## 2012-05-17 DIAGNOSIS — F319 Bipolar disorder, unspecified: Secondary | ICD-10-CM

## 2012-05-17 DIAGNOSIS — F39 Unspecified mood [affective] disorder: Secondary | ICD-10-CM

## 2012-05-17 MED ORDER — BUPROPION HCL ER (XL) 300 MG PO TB24: 300.0000 mg | ORAL_TABLET | Freq: Every day | ORAL | Status: DC

## 2012-05-17 MED ORDER — LAMOTRIGINE 150 MG PO TABS: 150.0000 mg | ORAL_TABLET | Freq: Two times a day (BID) | ORAL | Status: DC

## 2012-05-17 NOTE — Progress Notes (Signed)
Chief complaint Medication management and followup  History of presenting illness Patient is 42 year old Caucasian employed female who came for her followup appointment.  She is compliant with her psychiatric medication.  She denies any side effects or any rash.  She feels her current medications are working very well.  She denies any recent agitation anger or mood swings.  She feels sometime her 44 years old daughter makes her crazy but she denies any recent agitation anger .  She is sleeping good.  She feels stress at work but she is handling very well.  She designated tremors or shakes.  She denies any crying spells or any anhedonia.  She wants to continue her current psychiatric medication.  She is not drinking or using any little substance.  She saw her primary care physician last December and no new medication.  She reported her blood work was normal except low vitamin D.  She supposed to take vitamin D however has not started yet.  Current psychiatric medication Lamictal 150 mg 2 tablet daily Wellbutrin XL 300 mg daily  Past psychiatric history Patient denies any psychiatric inpatient treatment or suicidal attempt.  She was given Prozac and Geodon by her primary care physician which she remember having the side effects.  Patient denies any history of hallucination or paranoia but admitted history of mood swings anger and irritability.  Medical history Obesity.  She see Dr. Linford Arnold in [place].  She also has low vitamin D and prescribe vitamin D but she does not take it  Family history. Patient endorse father has anxiety issues.  Psychosocial history.  The patient lives with her 27 year old daughter.  Review of Systems  Constitutional:       Obesity  HENT: Negative.   Respiratory: Negative.   Cardiovascular: Negative.   Musculoskeletal: Positive for back pain.  Neurological: Negative.   Psychiatric/Behavioral: Negative for depression, suicidal ideas, hallucinations, memory  loss and substance abuse. The patient is nervous/anxious. The patient does not have insomnia.    Filed Vitals:   05/17/12 1136  BP: 128/87  Pulse: 87  Patient refuses for weight  Mental status examination Patient is mildly obese female who is casually dressed and fairly groomed.  She is pleasant and cooperative.  Her speech is fast but clear and coherent.  Thought processes logical and goal-directed.  She described her mood is anxious and her affect is mood appropriate.  She denies any active or passive suicidal thoughts or homicidal thoughts.  There were no tremors or shakes present.  There were no flight of ideas or any looseness of session.  Her fund of knowledge is adequate.  There were no delusion or psychotic symptoms present.  Her attention and concentration is good.  She's alert and oriented x3.  Her insight judgment and pulse control is okay.  Assessment Axis I mood disorder NOS rule out bipolar disorder 1 Axis II deferred Axis III see medical history Axis IV mild to moderate  Plan I will continue her current psychiatric medication.  I explained risks and benefits of medication and recommend to call us back if she has any questions or concerns refills symptom.  I will see her in 3 months.

## 2012-05-17 NOTE — Telephone Encounter (Signed)
05/17/12 1:33pm Called and left msg for pt to the office due to to need to make next f/u appt - pt left w/o scheduling - unable to lv msg on hm# mailbox full - called and left msg on cell#/sh

## 2012-08-09 ENCOUNTER — Encounter: Payer: Self-pay | Admitting: *Deleted

## 2012-08-09 ENCOUNTER — Emergency Department
Admission: EM | Admit: 2012-08-09 | Discharge: 2012-08-09 | Disposition: A | Discharge status: Home / Self Care | Payer: 59 | Source: Home / Self Care | Attending: Emergency Medicine | Admitting: Emergency Medicine

## 2012-08-09 DIAGNOSIS — H109 Unspecified conjunctivitis: Secondary | ICD-10-CM

## 2012-08-09 MED ORDER — POLYMYXIN B-TRIMETHOPRIM 10000-0.1 UNIT/ML-% OP SOLN: 1.0000 [drp] | Freq: Four times a day (QID) | OPHTHALMIC | Status: DC

## 2012-08-09 NOTE — ED Notes (Signed)
Right eye drainage x this AM, denies pain

## 2012-08-09 NOTE — ED Provider Notes (Signed)
CSN: [id number]     Arrival date & time 08/09/12  4098 History     First MD Initiated Contact with Patient 08/09/12 0845     Chief Complaint  Patient presents with  . Eye Drainage   (Consider location/radiation/quality/duration/timing/severity/associated sxs/prior Treatment) HPI Charlotte Hobbs presents today with an EYE COMPLAINT.  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  She had a patient with [facility] about 2 days ago.   Location: right  Onset: 1  Days   Symptoms: Redness: yes Discharge: yes Pain: no Photophobia: no Decreased Vision: no URI symptoms: no Itching/Allergy sxs: no Glaucoma: no Recent eye surgery: no Contact lens use: no  Red Flags Trauma: no Foreign Body: no Vomiting/HA: no Halos around lights: no Chickenpox or zoster: no     Past Medical History  Diagnosis Date  . Depression     bi polar  . Mania    Past Surgical History  Procedure Laterality Date  . Myomectomy  1996  . Cesarean section  2002  . Lumbar laminectomy/decompression microdiscectomy  11/13/2011    Procedure: LUMBAR LAMINECTOMY/DECOMPRESSION MICRODISCECTOMY 2 LEVELS;  Surgeon: Cristi Loron, MD;  Location: [facility];  Service: Neurosurgery;  Laterality: Right;  RIGHT Lumbar four-five Lumbar five sacral one diskectomy   Family History  Problem Relation Age of Onset  . Hypertension Father   . Heart attack Father 63  . Anxiety disorder Father   . Heart attack Paternal Grandfather 40   History  Substance Use Topics  . Smoking status: Current Some Day Smoker -- 0.50 packs/day for 25 years    Types: Cigarettes  . Smokeless tobacco: Not on file  . Alcohol Use: No   OB History   Grav Para Term Preterm Abortions TAB SAB Ect Mult Living                 Review of Systems  All other systems reviewed and are negative.    Allergies  Review of patient's allergies indicates no known allergies.  Home Medications   Current Outpatient  Rx  Name  Route  Sig  Dispense  Refill  . buPROPion (WELLBUTRIN XL) 300 MG 24 hr tablet   Oral   Take 1 tablet (300 mg total) by mouth daily.   30 tablet   2   . ibuprofen (ADVIL,MOTRIN) 200 MG tablet   Oral   Take 800 mg by mouth every 6 (six) hours as needed. For pain         . lamoTRIgine (LAMICTAL) 150 MG tablet   Oral   Take 1 tablet (150 mg total) by mouth 2 (two) times daily.   60 tablet   2   . trimethoprim-polymyxin b (POLYTRIM) ophthalmic solution   Both Eyes   Place 1 drop into both eyes every 6 (six) hours.   10 mL   0    BP 112/75  Pulse 83  Temp(Src) 97.8 F (36.6 C) (Oral)  Resp 14  Ht 5\' 5"  (1.651 m)  Wt 224 lb (101.606 kg)  BMI 37.28 kg/m2  SpO2 97% Physical Exam  Nursing note and vitals reviewed. Constitutional: She is oriented to person, place, and time. She appears well-developed and well-nourished.  HENT:  Head: Normocephalic and atraumatic.  Eyes: No scleral icterus.  Neck: Neck supple.  Cardiovascular: Regular rhythm and normal heart sounds.   Pulmonary/Chest: Effort normal and breath sounds normal. No respiratory distress.  Neurological: She is alert and oriented to person, place,  and time.  Skin: Skin is warm and dry.  Psychiatric: She has a normal mood and affect. Her speech is normal.    ED Course   Procedures (including critical care time)  Labs Reviewed - No data to display No results found. 1. Conjunctivitis     MDM   I discussed with the patient that she may just have allergic conjunctivitis, however with her job of working with patients, I would like her to take some antibiotic eye drops for the next few days.  Advised to wash hands frequently and we gave her a note for work today.  If worsening, and is to followup with ophthalmology or her PCP.  Marlaine Hind, MD 08/09/12 [phone]

## 2012-10-01 ENCOUNTER — Encounter (HOSPITAL_COMMUNITY): Payer: Self-pay | Admitting: Psychiatry

## 2012-10-01 ENCOUNTER — Ambulatory Visit (INDEPENDENT_AMBULATORY_CARE_PROVIDER_SITE_OTHER): Payer: 59 | Admitting: Psychiatry

## 2012-10-01 VITALS — BP 126/79 | HR 77 | Ht 64.0 in

## 2012-10-01 DIAGNOSIS — F39 Unspecified mood [affective] disorder: Secondary | ICD-10-CM

## 2012-10-01 DIAGNOSIS — F319 Bipolar disorder, unspecified: Secondary | ICD-10-CM

## 2012-10-01 DIAGNOSIS — Z79899 Other long term (current) drug therapy: Secondary | ICD-10-CM

## 2012-10-01 MED ORDER — BUPROPION HCL ER (XL) 300 MG PO TB24: 300.0000 mg | ORAL_TABLET | Freq: Every day | ORAL | Status: DC

## 2012-10-01 MED ORDER — LAMOTRIGINE 150 MG PO TABS: 150.0000 mg | ORAL_TABLET | Freq: Two times a day (BID) | ORAL | Status: DC

## 2012-10-01 NOTE — Progress Notes (Signed)
[facility] [address] Progress Note  Charlotte Hobbs [id number] 42 y.o.  10/01/2012 9:35 AM  Chief Complaint:  Medication management and followup.  History of Present Illness:  Patient is a 42 year old Caucasian employed female who came for her followup appointment.  She refuses to go on weighing scale as she feels embarrassed that she gained weight from the past.  She admitted not keeping her calorie intake under control.  Overall she is doing better.  She recently started online courses for computer security because she is not happy with her current job.  She is also relieved that her daughter is seeing a psychiatrist Dr. Christell Constant in [place].  Daughter is taking Wellbutrin.  Patient denies any recent anger, agitation, severe mood swing or crying spells.  She is sleeping better.  She endorsed her energy level is good.  She has not taken any vitamin D.  She has no word work.  She recently visited the emergency room for pink eye and given medication.  It has been resolved.  Patient denies any rash, itching or any tremors.  Suicidal Ideation: No Plan Formed: No Patient has means to carry out plan: No  Homicidal Ideation: No Plan Formed: No Patient has means to carry out plan: No  Medical History; Obesity, patient has historically low vitamin D level however she does not take vitamin B regularly.  Her primary care physician is Dr. Linford Arnold in [place].  Psycho sial History; Patient lives with her daughter.  She is working diagnoses: Hospital.  Review of Systems: Psychiatric: Agitation: No Hallucination: No Depressed Mood: No Insomnia: No Hypersomnia: No Altered Concentration: No Feels Worthless: No Grandiose Ideas: No Belief In Special Powers: No New/Increased Substance Abuse: No Compulsions: No  Neurologic: Headache: No Seizure: No Paresthesias: No    Outpatient Encounter Prescriptions as of 10/01/2012  Medication Sig Dispense Refill  . buPROPion (WELLBUTRIN  XL) 300 MG 24 hr tablet Take 1 tablet (300 mg total) by mouth daily.  30 tablet  2  . lamoTRIgine (LAMICTAL) 150 MG tablet Take 1 tablet (150 mg total) by mouth 2 (two) times daily.  60 tablet  2  . [DISCONTINUED] buPROPion (WELLBUTRIN XL) 300 MG 24 hr tablet Take 1 tablet (300 mg total) by mouth daily.  30 tablet  2  . [DISCONTINUED] lamoTRIgine (LAMICTAL) 150 MG tablet Take 1 tablet (150 mg total) by mouth 2 (two) times daily.  60 tablet  2  . [DISCONTINUED] ibuprofen (ADVIL,MOTRIN) 200 MG tablet Take 800 mg by mouth every 6 (six) hours as needed. For pain      . [DISCONTINUED] trimethoprim-polymyxin b (POLYTRIM) ophthalmic solution Place 1 drop into both eyes every 6 (six) hours.  10 mL  0   No facility-administered encounter medications on file as of 10/01/2012.    No results found for this or any previous visit (from the past 2160 hour(s)).  Past Psychiatric History/Hospitalization(s) Patient denies any previous history of psychiatric inpatient treatment or any suicidal attempt.  In the past she has given Prozac and Geodon by her primary care physician however she has side effects.  Patient endorses history of mood swings anger and irritability but denies any psychosis. Anxiety: No Bipolar Disorder: Yes Depression: Yes Mania: Yes Psychosis: No Schizophrenia: No Personality Disorder: No Hospitalization for psychiatric illness: No History of Electroconvulsive Shock Therapy: No Prior Suicide Attempts: No  Physical Exam: Constitutional:  BP 126/79  Pulse 77  Ht 5\' 4"  (1.626 m)  Musculoskeletal: Strength & Muscle Tone: within normal limits Gait &  Station: normal Patient leans: N/A  Mental Status Examination;  Patient is obese female who is casually dressed and fairly groomed.  She is pleasant, cooperative and maintained good eye contact.  Her speech is fast but clear and coherent.  Her thought processes logical.  She described her mood as anxious and affect is mood appropriate.   She denies any active or passive suicidal thoughts or homicidal thoughts.  There were no paranoia or delusions present at this time.  There were no flight of ideas or any loose association.  She is alert and oriented x3.  There are no tremors or shakes present.  Her fund of knowledge is adequate.  She is alert and oriented x3.  Her insight judgment and impulse control is okay.   Medical Decision Making (Choose Three): Established Problem, Stable/Improving (1), Review of Psycho-Social Stressors (1), Review or order clinical lab tests (1), Review of Last Therapy Session (1) and Review of Medication Regimen & Side Effects (2)  Assessment: Axis I: Mood disorder NOS, rule out bipolar disorder   Axis II: Deferred  Axis III:  Past Medical History  Diagnosis Date  . Depression     bi polar  . Mania     Axis IV: Mild   Plan:  I will continue Lamictal and Wellbutrin at present dose.  We will do blood work since patient has not done blood work in a while.  We will order CBC, CMP, TSH, hemoglobin A1c.  Recommend to call us back if she has any question or any concern.  Followup in 3 months.Time spent 25 minutes.  More than 50% of the time spent in psychoeducation, counseling and coordination of care.  Discuss safety plan that anytime having active suicidal thoughts or homicidal thoughts then patient need to call 911 or go to the local emergency room.   ARFEEN,SYED T., MD 10/01/2012

## 2012-10-09 LAB — CBC WITH DIFFERENTIAL/PLATELET
Basophils Relative: 0 % (ref 0–1)
Eosinophils Absolute: 0.2 10*3/uL (ref 0.0–0.7)
HCT: 41.8 % (ref 36.0–46.0)
Hemoglobin: 14.2 g/dL (ref 12.0–15.0)
Lymphs Abs: 2.4 10*3/uL (ref 0.7–4.0)
MCH: 31.6 pg (ref 26.0–34.0)
MCHC: 34 g/dL (ref 30.0–36.0)
Monocytes Absolute: 0.8 10*3/uL (ref 0.1–1.0)
Monocytes Relative: 10 % (ref 3–12)
Neutrophils Relative %: 56 % (ref 43–77)
RBC: 4.5 MIL/uL (ref 3.87–5.11)

## 2012-10-10 ENCOUNTER — Telehealth (HOSPITAL_COMMUNITY): Payer: Self-pay

## 2012-10-10 NOTE — Telephone Encounter (Signed)
10/10/12 4:26pm Retuned a call from [name] [name] #[phone] #[phone] - question about pt's dob - dob was verified.Charlotte KitchenMarguerite Olea

## 2012-11-21 ENCOUNTER — Other Ambulatory Visit: Payer: Self-pay

## 2012-12-31 ENCOUNTER — Encounter (HOSPITAL_COMMUNITY): Payer: Self-pay | Admitting: Psychiatry

## 2012-12-31 ENCOUNTER — Ambulatory Visit (INDEPENDENT_AMBULATORY_CARE_PROVIDER_SITE_OTHER): Payer: 59 | Admitting: Psychiatry

## 2012-12-31 VITALS — BP 119/69 | HR 77 | Ht 64.0 in

## 2012-12-31 DIAGNOSIS — F319 Bipolar disorder, unspecified: Secondary | ICD-10-CM

## 2012-12-31 DIAGNOSIS — F39 Unspecified mood [affective] disorder: Secondary | ICD-10-CM

## 2012-12-31 MED ORDER — LAMOTRIGINE 150 MG PO TABS: 150.0000 mg | ORAL_TABLET | Freq: Two times a day (BID) | ORAL | Status: DC

## 2012-12-31 MED ORDER — BUPROPION HCL ER (XL) 300 MG PO TB24: 300.0000 mg | ORAL_TABLET | Freq: Every day | ORAL | Status: DC

## 2012-12-31 NOTE — Progress Notes (Signed)
Charlotte Hobbs [address] Progress Note  Charlotte Hobbs [id number] 42 y.o.  12/31/2012 4:42 PM  Chief Complaint:  Medication management and followup.  History of Present Illness:  Charlotte Hobbs came for a followup appointment.  She's compliant with the medication.  She denies any side effects.  She continued to have stress at work but she is feeling better.  She denies any agitation, anger or any mood swing.  Her daughter is also taking Wellbutrin from [place] office.  She had blood work.  Her CBC and hemoglobin A1c is normal.  Her chemistry is pending.  Patient was to continue Wellbutrin and Lamictal.  She has no rash or itching.  Patient is working at [employer].   Suicidal Ideation: No Plan Formed: No Patient has means to carry out plan: No  Homicidal Ideation: No Plan Formed: No Patient has means to carry out plan: No  Medical History; Obesity, patient has historically low vitamin D level. Her primary care physician is Dr. Linford Hobbs in [place].  Review of Systems: Psychiatric: Agitation: No Hallucination: No Depressed Mood: No Insomnia: No Hypersomnia: No Altered Concentration: No Feels Worthless: No Grandiose Ideas: No Belief In Special Powers: No New/Increased Substance Abuse: No Compulsions: No  Neurologic: Headache: No Seizure: No Paresthesias: No    Outpatient Encounter Prescriptions as of 12/31/2012  Medication Sig  . buPROPion (WELLBUTRIN XL) 300 MG 24 hr tablet Take 1 tablet (300 mg total) by mouth daily.  [name] lamoTRIgine (LAMICTAL) 150 MG tablet Take 1 tablet (150 mg total) by mouth 2 (two) times daily.  . [DISCONTINUED] buPROPion (WELLBUTRIN XL) 300 MG 24 hr tablet Take 1 tablet (300 mg total) by mouth daily.  . [DISCONTINUED] lamoTRIgine (LAMICTAL) 150 MG tablet Take 1 tablet (150 mg total) by mouth 2 (two) times daily.    Recent Results (from the past 2160 hour(s))  HEMOGLOBIN A1C     Status: None   Collection Time    10/09/12  4:34  PM      Result Value Range   Hemoglobin A1C 5.6  <5.7 %   Comment:                                                                            According to the ADA Clinical Practice Recommendations for 2011, when     HbA1c is used as a screening test:             >=6.5%   Diagnostic of Diabetes Mellitus                (if abnormal result is confirmed)           5.7-6.4%   Increased risk of developing Diabetes Mellitus           References:Diagnosis and Classification of Diabetes Mellitus,Diabetes     Care,2011,34(Suppl 1):S62-S69 and Standards of Medical Care in             Diabetes - 2011,Diabetes Care,2011,34 (Suppl 1):S11-S61.         Mean Plasma Glucose 114  <117 mg/dL  CBC WITH DIFFERENTIAL     Status: None   Collection Time    10/09/12  4:34 PM      Result Value  Range   WBC 7.6  4.0 - 10.5 K/uL   RBC 4.50  3.87 - 5.11 MIL/uL   Hemoglobin 14.2  12.0 - 15.0 g/dL   HCT 16.1  09.6 - 04.5 %   MCV 92.9  78.0 - 100.0 fL   MCH 31.6  26.0 - 34.0 pg   MCHC 34.0  30.0 - 36.0 g/dL   RDW 40.9  81.1 - 91.4 %   Platelets 284  150 - 400 K/uL   Neutrophils Relative % 56  43 - 77 %   Neutro Abs 4.2  1.7 - 7.7 K/uL   Lymphocytes Relative 32  12 - 46 %   Lymphs Abs 2.4  0.7 - 4.0 K/uL   Monocytes Relative 10  3 - 12 %   Monocytes Absolute 0.8  0.1 - 1.0 K/uL   Eosinophils Relative 2  0 - 5 %   Eosinophils Absolute 0.2  0.0 - 0.7 K/uL   Basophils Relative 0  0 - 1 %   Basophils Absolute 0.0  0.0 - 0.1 K/uL   Smear Review Criteria for review not met    TSH     Status: None   Collection Time    10/09/12  4:34 PM      Result Value Range   TSH 0.619  0.350 - 4.500 uIU/mL    Past Psychiatric History/Hospitalization(s) Patient denies any previous history of psychiatric inpatient treatment or any suicidal attempt.  In the past she has given Prozac and Geodon by her primary care physician however she has side effects.  Patient endorses history of mood swings anger and irritability but denies  any psychosis. Anxiety: No Bipolar Disorder: Yes Depression: Yes Mania: Yes Psychosis: No Schizophrenia: No Personality Disorder: No Hospitalization for psychiatric illness: No History of Electroconvulsive Shock Therapy: No Prior Suicide Attempts: No  Physical Exam: Constitutional:  BP 119/69  Pulse 77  Ht 5\' 4"  (1.626 m)  Musculoskeletal: Strength & Muscle Tone: within normal limits Gait & Station: normal Patient leans: N/A  Mental Status Examination;  Patient is obese female who is casually dressed and fairly groomed.  She is pleasant, cooperative and maintained good eye contact.  Her speech is fast but clear and coherent.  Her thought processes logical.  She described her mood as anxious and affect is mood appropriate.  She denies any active or passive suicidal thoughts or homicidal thoughts.  There were no paranoia or delusions present at this time.  There were no flight of ideas or any loose association.  She is alert and oriented x3.  There are no tremors or shakes present.  Her fund of knowledge is adequate.  She is alert and oriented x3.  Her insight judgment and impulse control is okay.   Medical Decision Making (Choose Three): Established Problem, Stable/Improving (1), Review of Psycho-Social Stressors (1), Review or order clinical lab tests (1), Review of Last Therapy Session (1) and Review of Medication Regimen & Side Effects (2)  Assessment: Axis I: Mood disorder NOS, rule out bipolar disorder   Axis II: Deferred  Axis III:  Past Medical History  Diagnosis Date  . Depression     bi polar  . Mania     Axis IV: Mild   Plan:  I will continue Lamictal and Wellbutrin at present dose.  I reviewed her blood work.  Recommend to call us back if she is any question or any concern.  Followup in 6 months.  Charlotte Dromgoole T., MD 12/31/2012

## 2013-03-10 ENCOUNTER — Other Ambulatory Visit (HOSPITAL_COMMUNITY): Payer: Self-pay | Admitting: Psychiatry

## 2013-04-10 ENCOUNTER — Telehealth (HOSPITAL_COMMUNITY): Payer: Self-pay

## 2013-04-10 ENCOUNTER — Ambulatory Visit (INDEPENDENT_AMBULATORY_CARE_PROVIDER_SITE_OTHER): Payer: 59 | Admitting: Psychiatry

## 2013-04-10 ENCOUNTER — Encounter (HOSPITAL_COMMUNITY): Payer: Self-pay | Admitting: Psychiatry

## 2013-04-10 VITALS — BP 129/90 | HR 72 | Ht 64.0 in

## 2013-04-10 DIAGNOSIS — F3162 Bipolar disorder, current episode mixed, moderate: Secondary | ICD-10-CM

## 2013-04-10 DIAGNOSIS — F29 Unspecified psychosis not due to a substance or known physiological condition: Secondary | ICD-10-CM

## 2013-04-10 DIAGNOSIS — F319 Bipolar disorder, unspecified: Secondary | ICD-10-CM

## 2013-04-10 MED ORDER — RISPERIDONE 1 MG PO TABS: ORAL_TABLET | ORAL | Status: DC

## 2013-04-10 NOTE — Progress Notes (Signed)
[place] [phone] Progress Note  Charlotte Hobbs [id number] 43 y.o.  04/10/2013 9:08 AM  Chief Complaint:  I am not doing good.  I am feeling paranoid .    History of Present Illness:  Charlotte Hobbs came earlier than her scheduled appointment.  She is scheduled to see me in June however she requested to this appointment because she's been experiencing increased paranoia and very anxious.  Patient also reported drinking 2-3 times in past 2 months .  She reported feeling anxious with poor sleep.  She also closed her face book account because she felt that she has been judged by other people.  She admitted more emotional, tearful, racing thoughts and lack of sleep.  She could not remember any stressor however admitted that in the past 6 months she is trying to manage her mother's house and her own house.  Her mother is suffering from cirrhosis and recently she has a lot of health complications .  She is trying to sell her house in her mother's house because she could not handle public places.  She also reported significant weight loss because she is dieting and only eating small calories.  She is not using any weight loss supplement.  She has lost more than 20 pounds but she refused to get on the scale today.  Patient admitted sometime very emotional tearful but denies any suicidal thoughts or homicidal thoughts.  Patient also appears labile with inappropriate laughing sometime.  Patient also reported sometimes irritability anger and mood swing denies any aggression or violence.  She reported her daughter is doing very well and she has no concern for her daughter.  She continues to go to work and there has been no recent issues at work.  However her work his overall stressful.  Patient told that she stopped drinking 4 weeks ago but admitted she had binge drinking the last time in January.  She denies any hallucination but endorsed some time paranoia and feels people are watching her.  She is compliant  with psychotropic medication.  She denies any side effects.  She denies any tremors or shakes.  She has no new medication added.  She's not using any drugs.  Patient denies any rash or itching.    Suicidal Ideation: No Plan Formed: No Patient has means to carry out plan: No  Homicidal Ideation: No Plan Formed: No Patient has means to carry out plan: No  Medical History; Obesity, patient has historically low vitamin D level. Her primary care physician is Dr. Madilyn Fireman in [place].  Review of Systems: Psychiatric: Agitation: Irritability and anger Hallucination: No Depressed Mood: No Insomnia: Yes Hypersomnia: No Altered Concentration: No Feels Worthless: No Grandiose Ideas: No Belief In Special Powers:  paraanoiia New/Increased Substance Abuse: Yes Compulsions: No  Neurologic: Headache: Yes Seizure: No Paresthesias: No    Outpatient Encounter Prescriptions as of 04/10/2013  Medication Sig  . buPROPion (WELLBUTRIN XL) 300 MG 24 hr tablet Take 1 tablet (300 mg total) by mouth daily.  [name] lamoTRIgine (LAMICTAL) 150 MG tablet Take 1 tablet (150 mg total) by mouth 2 (two) times daily.  . risperiDONE (RISPERDAL) 1 MG tablet Take 1/2 to 1 tab at bed time    No results found for this or any previous visit (from the past 2160 hour(s)).  Past Psychiatric History/Hospitalization(s) Patient denies any previous history of psychiatric inpatient treatment or any suicidal attempt.  In the past she has given Prozac and Geodon by her primary care physician however she has side  effects.  Patient endorses history of mood swings anger and irritability but denies any psychosis. Anxiety: No Bipolar Disorder: Yes Depression: Yes Mania: Yes Psychosis: No Schizophrenia: No Personality Disorder: No Hospitalization for psychiatric illness: No History of Electroconvulsive Shock Therapy: No Prior Suicide Attempts: No  Physical Exam: Constitutional:  BP 129/90  Pulse 72  Ht 5\' 4"  (1.626  m)  Musculoskeletal: Strength & Muscle Tone: within normal limits Gait & Station: normal Patient leans: N/A  Mental Status Examination;  Patient is casually dressed and fairly groomed.  She appears somewhat labile with inappropriate laugh but cooperative.  Her speech remains fast but coherent.  She maintains fair eye contact.  Her thought process is somewhat circumstantial however there were no flight of ideas or any loose association.  She described her mood as anxious but she appears sometime inappropriate laugh and her affect is labile.  She endorsed paranoia and believed that people watching her but she denies any auditory hallucination, visual hallucinations, active or passive suicidal thoughts or homicidal thoughts.  Her fund of knowledge is adequate.  Her cognition is intact.  There were no tremors or shakes.  Attention and concentration is distracted.  She is alert and oriented x3.  Her insight judgment and impulse control is okay.   Established Problem, Stable/Improving (1), New problem, with additional work up planned, Review of Psycho-Social Stressors (1), Established Problem, Worsening (2), Review of Last Therapy Session (1), Review of Medication Regimen & Side Effects (2) and Review of New Medication or Change in Dosage (2)  Assessment: Axis I: Bipolar disorder with psychotic features    Axis II: Deferred  Axis III:  Past Medical History  Diagnosis Date  . Depression     bi polar  . Mania     Axis IV: Mild   Plan:  I review her symptoms, psychosocial stressors in her current medication.  Patient currently worsening from the past.  She's experiencing paranoia and extreme mood lability.  I strongly recommended to stop drinking however she has not drank in 6 weeks.  We will add low-dose Risperdal.  I will start Risperdal 1 mg, half to one tablet at bedtime to help her paranoia.  I explained risks and benefits of medication especially metabolic syndrome and extrapyramidal side  effects.  Recommended to call us back if she has any question or any concern.  Continue Lamictal and Wellbutrin at present dose.  Followup in 3-4 weeks.  Time spent 25 minutes.  More than 50% of the time spent in psychoeducation, counseling and coordination of care.  Discuss safety plan that anytime having active suicidal thoughts or homicidal thoughts then patient need to call 911 or go to the local emergency room.   Narmeen Kerper T., MD 04/10/2013

## 2013-04-10 NOTE — Telephone Encounter (Signed)
I returned patient's phone call.  She is concerned about the weight gain from Risperdal.  She was given a prescription this morning and after reading the side effects she is concerned.  I explained that we have prescribing a low dose medication and if she started to have any side effects including any weight gain than she can stop the medication and call us back.  Reassurance given.  Patient agreed to try Risperdal.

## 2013-05-09 ENCOUNTER — Ambulatory Visit (INDEPENDENT_AMBULATORY_CARE_PROVIDER_SITE_OTHER): Payer: 59 | Admitting: Psychiatry

## 2013-05-09 ENCOUNTER — Encounter (HOSPITAL_COMMUNITY): Payer: Self-pay | Admitting: Psychiatry

## 2013-05-09 VITALS — BP 134/83 | HR 85 | Ht 64.25 in

## 2013-05-09 DIAGNOSIS — F29 Unspecified psychosis not due to a substance or known physiological condition: Secondary | ICD-10-CM

## 2013-05-09 DIAGNOSIS — F319 Bipolar disorder, unspecified: Secondary | ICD-10-CM

## 2013-05-09 DIAGNOSIS — F3162 Bipolar disorder, current episode mixed, moderate: Secondary | ICD-10-CM

## 2013-05-09 MED ORDER — RISPERIDONE 1 MG PO TABS: 1.0000 mg | ORAL_TABLET | Freq: Every day | ORAL | Status: DC

## 2013-05-09 MED ORDER — RAMELTEON 8 MG PO TABS: 8.0000 mg | ORAL_TABLET | Freq: Every day | ORAL | Status: DC

## 2013-05-09 MED ORDER — LAMOTRIGINE 150 MG PO TABS: 150.0000 mg | ORAL_TABLET | Freq: Two times a day (BID) | ORAL | Status: DC

## 2013-05-09 MED ORDER — BUPROPION HCL ER (XL) 300 MG PO TB24: 300.0000 mg | ORAL_TABLET | Freq: Every day | ORAL | Status: DC

## 2013-05-09 NOTE — Progress Notes (Signed)
[facility] [phone] Progress Note  Charlotte Hobbs [id number] 43 y.o.  05/09/2013 9:35 AM  Chief Complaint:  I like Risperdal.  I'm doing much better.    History of Present Illness:  Charlotte Hobbs came for her appointment.  She is taking Risperdal 1 mg at bedtime.  She seemed much improvement in her paranoia, depression, irritability and anger.  She denies any side effects of Risperdal.  She took time off 1 week from the work  Because she was very stressed out at job.  Since she back to work she has noticed poor sleep.  She sleeping during the day and unable to sleep at night.  Patient is not sure if the Risperdal causing that are coming back from vacation is causing insomnia.  However she like Risperdal because her mood has been much improved.  She denies any paranoid thinking.  Her house is on the market which is a big relief for her.  She is taking care of her mother who has chronic health issues.  Patient denies any tremors or shakes.  She wants to continue Risperdal along with Lamictal and Wellbutrin.  Patient does not noticed any weight gain or increase in her appetite.  Her vitals are stable but patient refused to go on a scale.  She's not using any drugs.  Patient denies any rash or itching.    Suicidal Ideation: No Plan Formed: No Patient has means to carry out plan: No  Homicidal Ideation: No Plan Formed: No Patient has means to carry out plan: No  Medical History; Obesity, patient has historically low vitamin D level. Her primary care physician is Dr. Madilyn Fireman in [place].  Review of Systems: Psychiatric: Agitation: No Hallucination: No Depressed Mood: No Insomnia: Yes Hypersomnia: No Altered Concentration: No Feels Worthless: No Grandiose Ideas: No Belief In Special Powers: No New/Increased Substance Abuse: No Compulsions: No  Neurologic: Headache: Yes Seizure: No Paresthesias: No    Outpatient Encounter Prescriptions as of 05/09/2013  Medication Sig  .  buPROPion (WELLBUTRIN XL) 300 MG 24 hr tablet Take 1 tablet (300 mg total) by mouth daily.  [name] lamoTRIgine (LAMICTAL) 150 MG tablet Take 1 tablet (150 mg total) by mouth 2 (two) times daily.  . risperiDONE (RISPERDAL) 1 MG tablet Take 1 tablet (1 mg total) by mouth at bedtime.  . [DISCONTINUED] buPROPion (WELLBUTRIN XL) 300 MG 24 hr tablet Take 1 tablet (300 mg total) by mouth daily.  . [DISCONTINUED] lamoTRIgine (LAMICTAL) 150 MG tablet Take 1 tablet (150 mg total) by mouth 2 (two) times daily.  . [DISCONTINUED] risperiDONE (RISPERDAL) 1 MG tablet Take 1/2 to 1 tab at bed time  . ramelteon (ROZEREM) 8 MG tablet Take 1 tablet (8 mg total) by mouth at bedtime.    No results found for this or any previous visit (from the past 2160 hour(s)).  Past Psychiatric History/Hospitalization(s) Patient denies any previous history of psychiatric inpatient treatment or any suicidal attempt.  In the past she has given Prozac and Geodon by her primary care physician however she has side effects.  Patient endorses history of mood swings anger and irritability but denies any psychosis. Anxiety: No Bipolar Disorder: Yes Depression: Yes Mania: Yes Psychosis: No Schizophrenia: No Personality Disorder: No Hospitalization for psychiatric illness: No History of Electroconvulsive Shock Therapy: No Prior Suicide Attempts: No  Physical Exam: Constitutional:  BP 134/83  Pulse 85  Ht 5' 4.25" (1.632 m)  Musculoskeletal: Strength & Muscle Tone: within normal limits Gait & Station: normal Patient leans: N/A  Mental Status Examination;  Patient is casually dressed and fairly groomed.  She is pleasant and cooperative.  Her speech is clear and coherent.  She maintains fair eye contact.  Her thought process is logical and goal-directed.  She has no flight of ideas or any loose his physician.  She described her mood is good and her affect is mood appropriate.  She denies any paranoia, delusions , hallucinations or  any excessive parts.  She denies any suicidal thoughts or homicidal thoughts.  Her fund of knowledge is adequate.  Her cognition is intact.  There were no tremors or shakes.  Her attention and concentration is okay.  She is alert and oriented x3.  Her insight judgment and impulse control is okay.   Established Problem, Stable/Improving (1), New problem, with additional work up planned, Review of Psycho-Social Stressors (1), Review of Last Therapy Session (1), Review of Medication Regimen & Side Effects (2) and Review of New Medication or Change in Dosage (2)  Assessment: Axis I: Bipolar disorder with psychotic features    Axis II: Deferred  Axis III:  Past Medical History  Diagnosis Date  . Depression     bi polar  . Mania     Axis IV: Mild   Plan:  Patient is doing better since we added Risperdal at bedtime.  She does not have any side effects including any tremors or shakes.  Patient reported her appetite is okay and she does not believe that she has gained weight.  She has insomnia which she is not sure the reason.  I recommended to try Rozerem 8 mg of melatonin over-the-counter to help her sleep cycle better.  Patient recently had one week for medication which could be the reason that she is unable to sleep at night and sleeping during the day.  I will continue risperidone 1 mg at bedtime, Lamictal 150 mg twice a day and Wellbutrin XL 300 mg daily.  19 days prescription was called into [facility] pharmacy.  Recommended to call us back if she is any question or any concern.  I also suggested that she can stop Rozerem months she had normal sleep cycle.  I will see her again in 3 months.  Time spent 25 minutes.  More than 50% of the time spent in psychoeducation, counseling and coordination of care.  Discuss safety plan that anytime having active suicidal thoughts or homicidal thoughts then patient need to call 911 or go to the local emergency room.   Jennae Hakeem T.,  MD 05/09/2013

## 2013-07-01 ENCOUNTER — Ambulatory Visit (HOSPITAL_COMMUNITY): Payer: Self-pay | Admitting: Psychiatry

## 2013-08-08 ENCOUNTER — Ambulatory Visit (HOSPITAL_COMMUNITY): Payer: Self-pay | Admitting: Psychiatry

## 2013-08-25 ENCOUNTER — Ambulatory Visit (INDEPENDENT_AMBULATORY_CARE_PROVIDER_SITE_OTHER): Payer: 59 | Admitting: Psychiatry

## 2013-08-25 ENCOUNTER — Encounter (HOSPITAL_COMMUNITY): Payer: Self-pay | Admitting: Psychiatry

## 2013-08-25 DIAGNOSIS — F29 Unspecified psychosis not due to a substance or known physiological condition: Secondary | ICD-10-CM

## 2013-08-25 DIAGNOSIS — F319 Bipolar disorder, unspecified: Secondary | ICD-10-CM

## 2013-08-25 DIAGNOSIS — F3162 Bipolar disorder, current episode mixed, moderate: Secondary | ICD-10-CM

## 2013-08-25 MED ORDER — BUPROPION HCL ER (XL) 300 MG PO TB24: 300.0000 mg | ORAL_TABLET | Freq: Every day | ORAL | Status: DC

## 2013-08-25 MED ORDER — LORAZEPAM 0.5 MG PO TABS: 0.5000 mg | ORAL_TABLET | ORAL | Status: DC | PRN

## 2013-08-25 MED ORDER — RISPERIDONE 1 MG PO TABS: 1.0000 mg | ORAL_TABLET | Freq: Every day | ORAL | Status: DC

## 2013-08-25 MED ORDER — LAMOTRIGINE 150 MG PO TABS: 150.0000 mg | ORAL_TABLET | Freq: Two times a day (BID) | ORAL | Status: DC

## 2013-08-25 NOTE — Progress Notes (Signed)
[facility] [phone] Progress Note  Charlotte Hobbs [id number] 43 y.o.  08/25/2013 3:27 PM  Chief Complaint:  I have a lot of anxiety.  I just back from vacation.  I am having crying spells and I am very emotional.     History of Present Illness:  Charlotte Hobbs came for her appointment.  She is in the back from [place] for 2 weeks of vacation.  She's been experiencing increased anxiety nervousness and sometimes having crying spells.  She is not sure what causing her anxiety.  She reported anxiety started one week ago.  Initially she felt it was a traffic and change location however she continues to experience anxiety and nervousness when she arrived home.  She is taking Risperdal, Lamictal and Wellbutrin.  She is taking melatonin which usually helps her sleep.  She denies any irritability, anger, mood swings.  She denies any paranoia or hallucination.  She stressed about her work.  She also try something to help with anxiety.  Patient does not have any rash or itching.  She does not have any extrapyramidal side effects.  She is not drinking or using any illegal substances.  Her vitals are stable.  Her appetite is okay.  Suicidal Ideation: No Plan Formed: No Patient has means to carry out plan: No  Homicidal Ideation: No Plan Formed: No Patient has means to carry out plan: No  Medical History; Obesity, patient has historically low vitamin D level. Her primary care physician is Dr. Madilyn Fireman in [place].  Review of Systems: Psychiatric: Agitation: No Hallucination: No Depressed Mood: Yes Insomnia: Yes Hypersomnia: No Altered Concentration: No Feels Worthless: No Grandiose Ideas: No Belief In Special Powers: No New/Increased Substance Abuse: No Compulsions: No  Neurologic: Headache: Yes Seizure: No Paresthesias: No    Outpatient Encounter Prescriptions as of 08/25/2013  Medication Sig  . buPROPion (WELLBUTRIN XL) 300 MG 24 hr tablet Take 1 tablet (300 mg total) by mouth  daily.  [name] lamoTRIgine (LAMICTAL) 150 MG tablet Take 1 tablet (150 mg total) by mouth 2 (two) times daily.  . risperiDONE (RISPERDAL) 1 MG tablet Take 1 tablet (1 mg total) by mouth at bedtime.  . [DISCONTINUED] buPROPion (WELLBUTRIN XL) 300 MG 24 hr tablet Take 1 tablet (300 mg total) by mouth daily.  . [DISCONTINUED] lamoTRIgine (LAMICTAL) 150 MG tablet Take 1 tablet (150 mg total) by mouth 2 (two) times daily.  . [DISCONTINUED] ramelteon (ROZEREM) 8 MG tablet Take 1 tablet (8 mg total) by mouth at bedtime.  . [DISCONTINUED] risperiDONE (RISPERDAL) 1 MG tablet Take 1 tablet (1 mg total) by mouth at bedtime.  [name] LORazepam (ATIVAN) 0.5 MG tablet Take 1 tablet (0.5 mg total) by mouth as needed for anxiety.    No results found for this or any previous visit (from the past 2160 hour(s)).  Past Psychiatric History/Hospitalization(s) Patient denies any previous history of psychiatric inpatient treatment or any suicidal attempt.  In the past she has given Prozac and Geodon by her primary care physician however she has side effects.  Patient endorses history of mood swings anger and irritability but denies any psychosis. Anxiety: No Bipolar Disorder: Yes Depression: Yes Mania: Yes Psychosis: No Schizophrenia: No Personality Disorder: No Hospitalization for psychiatric illness: No History of Electroconvulsive Shock Therapy: No Prior Suicide Attempts: No  Physical Exam: Constitutional:  There were no vitals taken for this visit.  Musculoskeletal: Strength & Muscle Tone: within normal limits Gait & Station: normal Patient leans: N/A  Mental Status Examination;  Patient is  casually dressed and fairly groomed.  She is anxious and emotional.  Her speech is fast but clear and coherent.  Her thought process is fast but logical.  She denies any hallucinations, paranoia or any suicidal thoughts or homicidal thoughts.  Her attention and concentration is fair.  Her psychomotor activity is slightly  increased.  Her fund of knowledge is okay.  She describes her mood is anxious and her affect is mood appropriate.  She is alert and oriented x3.  Her insight judgment and impulse control is okay.  Established Problem, Stable/Improving (1), Review of Psycho-Social Stressors (1), Established Problem, Worsening (2), Review of Last Therapy Session (1), Review of Medication Regimen & Side Effects (2) and Review of New Medication or Change in Dosage (2)  Assessment: Axis I: Bipolar disorder with psychotic features    Axis II: Deferred  Axis III:  Past Medical History  Diagnosis Date  . Depression     bi polar  . Mania     Axis IV: Mild   Plan:  It is unclear what is causing the anxiety, patient recently had vacation and she is also stressed about her job.  I recommended to try Ativan 0.5 mg for severe anxiety and panic attack.  Discussed benzodiazepine tolerance, withdrawal and dependency.  Recommended to continue Risperdal 1 mg at bedtime Lamictal 150 mg twice a day and Wellbutrin 300 mg daily.  Recommended to call Charlotte Hobbs back if she has any question or any concern.  I will see her again in 3 months.  Patient does not have any rash or itching with Lamictal.   I will see her again in 3 months.  Time spent 25 minutes.  More than 50% of the time spent in psychoeducation, counseling and coordination of care.  Discuss safety plan that anytime having active suicidal thoughts or homicidal thoughts then patient need to call 911 or go to the local emergency room.   Niveah Boerner T., MD 08/25/2013

## 2013-09-17 ENCOUNTER — Telehealth (HOSPITAL_COMMUNITY): Payer: Self-pay

## 2013-09-17 DIAGNOSIS — Z79899 Other long term (current) drug therapy: Secondary | ICD-10-CM

## 2013-09-17 MED ORDER — LORAZEPAM 0.5 MG PO TABS: 0.5000 mg | ORAL_TABLET | Freq: Two times a day (BID) | ORAL | Status: DC

## 2013-09-17 NOTE — Telephone Encounter (Signed)
I returned patient's phone call.  She is complaining of increased anxiety and nervousness.  She is not sure what causing it.  She wants to check her thyroid.  She also complaining of shakes.  I suggested to cut down and rest about half tablet to help the shakes and tremors and increase Ativan 0.5 mg twice a day.  I would also do blood test including CBC, CMP, TSH and hemoglobin A1c.  She will come to the office to pick up the prescription for her medication and blood work.

## 2013-09-17 NOTE — Telephone Encounter (Signed)
09/17/13 9:37am Rx script (ATIVAN 0.5 MG) is ready for pick-up along with lab request./sh

## 2013-09-25 ENCOUNTER — Other Ambulatory Visit (HOSPITAL_COMMUNITY): Payer: Self-pay | Admitting: Psychiatry

## 2013-11-17 ENCOUNTER — Other Ambulatory Visit (HOSPITAL_COMMUNITY)
Admission: RE | Admit: 2013-11-17 | Discharge: 2013-11-17 | Disposition: A | Discharge status: Home / Self Care | Payer: 59 | Source: Ambulatory Visit | Attending: Family Medicine | Admitting: Family Medicine

## 2013-11-17 ENCOUNTER — Ambulatory Visit (INDEPENDENT_AMBULATORY_CARE_PROVIDER_SITE_OTHER): Payer: 59 | Admitting: Family Medicine

## 2013-11-17 VITALS — BP 114/81 | HR 89 | Ht 64.25 in | Wt 227.0 lb

## 2013-11-17 DIAGNOSIS — Z01419 Encounter for gynecological examination (general) (routine) without abnormal findings: Secondary | ICD-10-CM | POA: Insufficient documentation

## 2013-11-17 DIAGNOSIS — Z1151 Encounter for screening for human papillomavirus (HPV): Secondary | ICD-10-CM | POA: Insufficient documentation

## 2013-11-17 DIAGNOSIS — Z0000 Encounter for general adult medical examination without abnormal findings: Secondary | ICD-10-CM

## 2013-11-17 DIAGNOSIS — M5441 Lumbago with sciatica, right side: Secondary | ICD-10-CM

## 2013-11-17 DIAGNOSIS — L309 Dermatitis, unspecified: Secondary | ICD-10-CM

## 2013-11-17 MED ORDER — TRIAMCINOLONE ACETONIDE 0.5 % EX OINT: 1.0000 "application " | TOPICAL_OINTMENT | Freq: Every day | CUTANEOUS | Status: DC

## 2013-11-17 NOTE — Progress Notes (Signed)
Subjective:     Charlotte Hobbs is a 43 y.o. female and is here for a comprehensive physical exam. The patient reports problems - dry patches on her hands.  She has been using an over the counter eczema cream..  Over the last year has had more pain in her low back radiating into her right side.  Occ has numbness going into the lateral right thigh as well.  It seems to be triggered by position. Hx of lumbar laminectomy in 2013.  She is interested in PT.    History   Social History  . Marital Status: Divorced    Spouse Name: N/A    Number of Children: 1  . Years of Education: N/A   Occupational History  . NURSE [employer]    Social History Main Topics  . Smoking status: Current Some Day Smoker -- 0.50 packs/day for 25 years    Types: Cigarettes  . Smokeless tobacco: Not on file  . Alcohol Use: Yes  . Drug Use: No  . Sexual Activity: Yes    Birth Control/ Protection: Condom   Other Topics Concern  . Not on file   Social History Narrative   No regular exercise.  Divorced.    Health Maintenance  Topic Date Due  . PAP SMEAR  01/17/2011  . INFLUENZA VACCINE  08/16/2013  . TETANUS/TDAP  07/17/2019    The following portions of the patient's history were reviewed and updated as appropriate: allergies, current medications, past family history, past medical history, past social history, past surgical history and problem list.  Review of Systems A comprehensive review of systems was negative.   Objective:    Ht 5' 4.25" (1.632 m)  Wt 227 lb (102.967 kg)  BMI 38.66 kg/m2 General appearance: alert, cooperative and appears stated age Head: Normocephalic, without obvious abnormality, atraumatic Eyes: conj clear, EOMI, PEERLA Ears: normal TM's and external ear canals both ears Nose: Nares normal. Septum midline. Mucosa normal. No drainage or sinus tenderness. Throat: lips, mucosa, and tongue normal; teeth and gums normal Neck: no adenopathy, no carotid bruit, no JVD, supple,  symmetrical, trachea midline and thyroid not enlarged, symmetric, no tenderness/mass/nodules Back: symmetric, no curvature. ROM normal. No CVA tenderness. Lungs: clear to auscultation bilaterally Breasts: normal appearance, no masses or tenderness Heart: regular rate and rhythm, S1, S2 normal, no murmur, click, rub or gallop Abdomen: soft, non-tender; bowel sounds normal; no masses,  no organomegaly Pelvic: cervix normal in appearance, external genitalia normal, no adnexal masses or tenderness, no cervical motion tenderness, rectovaginal septum normal, uterus normal size, shape, and consistency and vagina normal without discharge Extremities: extremities normal, atraumatic, no cyanosis or edema Pulses: 2+ and symmetric Skin: dry patches on dorsum of hands, between thumb and first finger and on the left elbow Lymph nodes: Cervical, supraclavicular, and axillary nodes normal. Neurologic: Alert and oriented X 3, normal strength and tone. Normal symmetric reflexes. Normal coordination and gait    Assessment:    Healthy female exam.      Plan:     See After Visit Summary for Counseling Recommendations   Keep up a regular exercise program and make sure you are eating a healthy diet Try to eat 4 servings of dairy a day, or if you are lactose intolerant take a calcium with vitamin D daily.  Your vaccines are up to date.  Await pap smear results.   Eczema - will tx w/ topical steroid. Discussed potential S.E.  Call if not helping.   Right lumbar  back pain with sciatic symptoms-will refer for physical therapy. If not improving then recommend that she consider seeing an orthopedist or possibly seeing her back surgeon again.

## 2013-11-17 NOTE — Patient Instructions (Signed)
Keep up a regular exercise program and make sure you are eating a healthy diet Try to eat 4 servings of dairy a day, or if you are lactose intolerant take a calcium with vitamin D daily.  Your vaccines are up to date.   

## 2013-11-18 ENCOUNTER — Encounter: Payer: Self-pay | Admitting: Family Medicine

## 2013-11-18 LAB — CBC WITH DIFFERENTIAL/PLATELET
BASOS PCT: 0 % (ref 0–1)
Basophils Absolute: 0 10*3/uL (ref 0.0–0.1)
Eosinophils Absolute: 0.2 10*3/uL (ref 0.0–0.7)
Eosinophils Relative: 2 % (ref 0–5)
HEMATOCRIT: 40.3 % (ref 36.0–46.0)
Hemoglobin: 13.7 g/dL (ref 12.0–15.0)
Lymphocytes Relative: 20 % (ref 12–46)
Lymphs Abs: 1.8 10*3/uL (ref 0.7–4.0)
MCH: 31.7 pg (ref 26.0–34.0)
MCHC: 34 g/dL (ref 30.0–36.0)
MCV: 93.3 fL (ref 78.0–100.0)
MONO ABS: 0.5 10*3/uL (ref 0.1–1.0)
Monocytes Relative: 6 % (ref 3–12)
Neutro Abs: 6.6 10*3/uL (ref 1.7–7.7)
Neutrophils Relative %: 72 % (ref 43–77)
Platelets: 221 10*3/uL (ref 150–400)
RBC: 4.32 MIL/uL (ref 3.87–5.11)
RDW: 14 % (ref 11.5–15.5)
WBC: 9.1 10*3/uL (ref 4.0–10.5)

## 2013-11-18 LAB — COMPREHENSIVE METABOLIC PANEL
ALK PHOS: 68 U/L (ref 39–117)
ALT: 12 U/L (ref 0–35)
AST: 13 U/L (ref 0–37)
Albumin: 4.2 g/dL (ref 3.5–5.2)
BILIRUBIN TOTAL: 0.4 mg/dL (ref 0.2–1.2)
BUN: 9 mg/dL (ref 6–23)
CO2: 23 mEq/L (ref 19–32)
CREATININE: 0.69 mg/dL (ref 0.50–1.10)
Calcium: 8.7 mg/dL (ref 8.4–10.5)
Chloride: 105 mEq/L (ref 96–112)
Glucose, Bld: 93 mg/dL (ref 70–99)
Potassium: 4.4 mEq/L (ref 3.5–5.3)
Sodium: 140 mEq/L (ref 135–145)
Total Protein: 6.2 g/dL (ref 6.0–8.3)

## 2013-11-18 LAB — HEMOGLOBIN A1C
Hgb A1c MFr Bld: 5.5 % (ref <5.7)
Mean Plasma Glucose: 111 mg/dL (ref <117)

## 2013-11-18 LAB — LIPID PANEL
Cholesterol: 174 mg/dL (ref 0–200)
HDL: 46 mg/dL (ref >39)
LDL CALC: 107 mg/dL — AB (ref 0–99)
Total CHOL/HDL Ratio: 3.8 Ratio
Triglycerides: 107 mg/dL (ref <150)
VLDL: 21 mg/dL (ref 0–40)

## 2013-11-18 LAB — CYTOLOGY - PAP

## 2013-11-18 LAB — TSH: TSH: 0.86 u[IU]/mL (ref 0.350–4.500)

## 2013-11-19 NOTE — Progress Notes (Signed)
Quick Note:  Call patient: Your Pap smear is normal. Repeat in 2-3 years. ______ 

## 2013-11-24 ENCOUNTER — Ambulatory Visit (INDEPENDENT_AMBULATORY_CARE_PROVIDER_SITE_OTHER): Payer: 59 | Admitting: Physical Therapy

## 2013-11-24 DIAGNOSIS — M5441 Lumbago with sciatica, right side: Secondary | ICD-10-CM

## 2013-11-24 DIAGNOSIS — M25559 Pain in unspecified hip: Secondary | ICD-10-CM

## 2013-11-24 DIAGNOSIS — M545 Low back pain: Secondary | ICD-10-CM

## 2013-11-24 DIAGNOSIS — R5381 Other malaise: Secondary | ICD-10-CM

## 2013-11-26 ENCOUNTER — Ambulatory Visit (INDEPENDENT_AMBULATORY_CARE_PROVIDER_SITE_OTHER): Payer: 59 | Admitting: Psychiatry

## 2013-11-26 ENCOUNTER — Encounter (HOSPITAL_COMMUNITY): Payer: Self-pay | Admitting: Psychiatry

## 2013-11-26 VITALS — BP 134/85 | HR 81 | Ht 64.0 in | Wt 218.6 lb

## 2013-11-26 DIAGNOSIS — F3162 Bipolar disorder, current episode mixed, moderate: Secondary | ICD-10-CM

## 2013-11-26 DIAGNOSIS — F319 Bipolar disorder, unspecified: Secondary | ICD-10-CM

## 2013-11-26 DIAGNOSIS — F29 Unspecified psychosis not due to a substance or known physiological condition: Secondary | ICD-10-CM

## 2013-11-26 MED ORDER — LORAZEPAM 0.5 MG PO TABS: 0.5000 mg | ORAL_TABLET | Freq: Two times a day (BID) | ORAL | Status: DC

## 2013-11-26 MED ORDER — LAMOTRIGINE 150 MG PO TABS: 150.0000 mg | ORAL_TABLET | Freq: Two times a day (BID) | ORAL | Status: DC

## 2013-11-26 MED ORDER — BUPROPION HCL ER (XL) 300 MG PO TB24: 300.0000 mg | ORAL_TABLET | Freq: Every day | ORAL | Status: DC

## 2013-11-26 MED ORDER — RISPERIDONE 1 MG PO TABS: 1.0000 mg | ORAL_TABLET | Freq: Every day | ORAL | Status: DC

## 2013-11-26 NOTE — Progress Notes (Signed)
[facility] Progress Note  Charlotte Hobbs [id number] 43 y.o.  11/26/2013 3:56 PM  Chief Complaint:  I am feeling better.  Ativan is helping me.  I did not cut down Risperdal. But my shakes are less intense from the past.     History of Present Illness:  Charlotte Hobbs came for her appointment.  She had called a few weeks ago because she was complaining of increase in anxiety and having shakes.  I have recommended to cut down her Risperdal and recommended to take Ativan twice a day.  She did not cut down her Risperdal because she does not want to get depressed and believe Risperdal is helping her.  She likes the Ativan and she has  Less anxiety.  Patient told 3 months ago she moved to her mother's house who is in a process of selling the house.  Patient told that her mother needs her and that causing some time anxiety and nervousness.  She is able to sleep good with Risperdal.  She denies any agitation or any anger.  She denies any paranoia or any hallucination.  She has a blood work and physical and she is relieved that there has been no new issues.  Patient denies any crying spells, irritability, aggression or violence.  She admitted also a stress coming from the work but denies any issues at work.  Patient is not drinking or using any illegal substances.  She is trying to lose her weight and has lost 10 pounds since the last visit.  She is watching her calorie intake.  She is more active and her energy level is good.  Her vitals are stable.  Suicidal Ideation: No Plan Formed: No Patient has means to carry out plan: No  Homicidal Ideation: No Plan Formed: No Patient has means to carry out plan: No  Medical History; Obesity, patient has historically low vitamin D level. Her primary care physician is Dr. Madilyn Fireman in [place].  Review of Systems  Constitutional: Positive for weight loss.  Skin: Negative for itching and rash.  Psychiatric/Behavioral: The patient is  nervous/anxious.    Psychiatric: Agitation: No Hallucination: No Depressed Mood: No Insomnia: No Hypersomnia: No Altered Concentration: No Feels Worthless: No Grandiose Ideas: No Belief In Special Powers: No New/Increased Substance Abuse: No Compulsions: No  Neurologic: Headache: Yes Seizure: No Paresthesias: No    Outpatient Encounter Prescriptions as of 11/26/2013  Medication Sig  . buPROPion (WELLBUTRIN XL) 300 MG 24 hr tablet Take 1 tablet (300 mg total) by mouth daily.  [name] lamoTRIgine (LAMICTAL) 150 MG tablet Take 1 tablet (150 mg total) by mouth 2 (two) times daily.  [name] LORazepam (ATIVAN) 0.5 MG tablet Take 1 tablet (0.5 mg total) by mouth 2 (two) times daily.  . risperiDONE (RISPERDAL) 1 MG tablet Take 1 tablet (1 mg total) by mouth at bedtime.  . triamcinolone ointment (KENALOG) 0.5 % Apply 1 application topically daily.  . [DISCONTINUED] buPROPion (WELLBUTRIN XL) 300 MG 24 hr tablet Take 1 tablet (300 mg total) by mouth daily.  . [DISCONTINUED] lamoTRIgine (LAMICTAL) 150 MG tablet Take 1 tablet (150 mg total) by mouth 2 (two) times daily.  . [DISCONTINUED] LORazepam (ATIVAN) 0.5 MG tablet Take 1 tablet (0.5 mg total) by mouth 2 (two) times daily.  . [DISCONTINUED] risperiDONE (RISPERDAL) 1 MG tablet Take 1 tablet (1 mg total) by mouth at bedtime.    Recent Results (from the past 2160 hour(s))  Cytology - PAP     Status: None  Collection Time: 11/17/13 12:00 AM  Result Value Ref Range   CYTOLOGY - PAP PAP RESULT   Hemoglobin A1c     Status: None   Collection Time: 11/17/13 10:35 AM  Result Value Ref Range   Hgb A1c MFr Bld 5.5 <5.7 %    Comment:                                                                        According to the ADA Clinical Practice Recommendations for 2011, when HbA1c is used as a screening test:     >=6.5%   Diagnostic of Diabetes Mellitus            (if abnormal result is confirmed)   5.7-6.4%   Increased risk of developing Diabetes  [place]   References:Diagnosis and Classification of Diabetes Mellitus,Diabetes XBDZ,3299,24(QASTM 1):S62-S69 and Standards of Medical Care in         Diabetes - 2011,Diabetes Care,2011,34 (Suppl 1):S11-S61.      Mean Plasma Glucose 111 <117 mg/dL  TSH     Status: None   Collection Time: 11/17/13 10:35 AM  Result Value Ref Range   TSH 0.860 0.350 - 4.500 uIU/mL  CBC with Differential     Status: None   Collection Time: 11/17/13 10:35 AM  Result Value Ref Range   WBC 9.1 4.0 - 10.5 K/uL   RBC 4.32 3.87 - 5.11 MIL/uL   Hemoglobin 13.7 12.0 - 15.0 g/dL   HCT 40.3 36.0 - 46.0 %   MCV 93.3 78.0 - 100.0 fL   MCH 31.7 26.0 - 34.0 pg   MCHC 34.0 30.0 - 36.0 g/dL   RDW 14.0 11.5 - 15.5 %   Platelets 221 150 - 400 K/uL   Neutrophils Relative % 72 43 - 77 %   Neutro Abs 6.6 1.7 - 7.7 K/uL   Lymphocytes Relative 20 12 - 46 %   Lymphs Abs 1.8 0.7 - 4.0 K/uL   Monocytes Relative 6 3 - 12 %   Monocytes Absolute 0.5 0.1 - 1.0 K/uL   Eosinophils Relative 2 0 - 5 %   Eosinophils Absolute 0.2 0.0 - 0.7 K/uL   Basophils Relative 0 0 - 1 %   Basophils Absolute 0.0 0.0 - 0.1 K/uL   Smear Review Criteria for review not met   Comprehensive metabolic panel     Status: None   Collection Time: 11/17/13 10:35 AM  Result Value Ref Range   Sodium 140 135 - 145 mEq/L   Potassium 4.4 3.5 - 5.3 mEq/L   Chloride 105 96 - 112 mEq/L   CO2 23 19 - 32 mEq/L   Glucose, Bld 93 70 - 99 mg/dL   BUN 9 6 - 23 mg/dL   Creat 0.69 0.50 - 1.10 mg/dL   Total Bilirubin 0.4 0.2 - 1.2 mg/dL   Alkaline Phosphatase 68 39 - 117 U/L   AST 13 0 - 37 U/L   ALT 12 0 - 35 U/L   Total Protein 6.2 6.0 - 8.3 g/dL   Albumin 4.2 3.5 - 5.2 g/dL   Calcium 8.7 8.4 - 10.5 mg/dL  Lipid panel     Status: Abnormal   Collection Time: 11/17/13 10:35 AM  Result Value  Ref Range   Cholesterol 174 0 - 200 mg/dL    Comment: ATP III Classification:       < 200        mg/dL        Desirable      200 - 239     mg/dL        Borderline  High      >= 240        mg/dL        High      Triglycerides 107 <150 mg/dL   HDL 46 >39 mg/dL   Total CHOL/HDL Ratio 3.8 Ratio   VLDL 21 0 - 40 mg/dL   LDL Cholesterol 107 (H) 0 - 99 mg/dL    Comment:   Total Cholesterol/HDL Ratio:CHD Risk                        Coronary Heart Disease Risk Table                                        Men       Women          1/2 Average Risk              3.4        3.3              Average Risk              5.0        4.4           2X Average Risk              9.6        7.1           3X Average Risk             23.4       11.0 Use the calculated Patient Ratio above and the CHD Risk table  to determine the patient's CHD Risk. ATP III Classification (LDL):       < 100        mg/dL         Optimal      100 - 129     mg/dL         Near or Above Optimal      130 - 159     mg/dL         Borderline High      160 - 189     mg/dL         High       > 190        mg/dL         Very High       Past Psychiatric History/Hospitalization(s) Patient denies any previous history of psychiatric inpatient treatment or any suicidal attempt.  In the past she has given Prozac and Geodon by her primary care physician however she has side effects.  Patient endorses history of mood swings anger and irritability but denies any psychosis. Anxiety: No Bipolar Disorder: Yes Depression: Yes Mania: Yes Psychosis: No Schizophrenia: No Personality Disorder: No Hospitalization for psychiatric illness: No History of Electroconvulsive Shock Therapy: No Prior Suicide Attempts: No  Physical Exam: Constitutional:  BP 134/85 mmHg  Pulse 81  Ht _0  (1.626 m)  Wt  218 lb 9.6 oz (99.156 kg)  BMI 37.50 kg/m2  Musculoskeletal: Strength & Muscle Tone: within normal limits Gait & Station: normal Patient leans: N/A  Mental Status Examination;  Patient is casually dressed and fairly groomed.  She is anxious and emotional.  Her speech is fast but clear and coherent.  Her  thought process is logical and goal-directed.  She denies any hallucinations, paranoia or any suicidal thoughts or homicidal thoughts.  Her attention and concentration is fair.  Her psychomotor activity is slightly increased.  Her fund of knowledge is okay.  She describes her mood is anxious and her affect is mood appropriate.  She is alert and oriented x3.  Her cognition is intact. Her insight judgment and impulse control is okay.  Established Problem, Stable/Improving (1), Review of Psycho-Social Stressors (1), Review or order clinical lab tests (1), Review of Last Therapy Session (1), Review of Medication Regimen & Side Effects (2) and Review of New Medication or Change in Dosage (2)  Assessment: Axis I: Bipolar disorder with psychotic features    Axis II: Deferred  Axis III:  Past Medical History  Diagnosis Date  . Depression     bi polar  . Mania     Axis IV: Mild   Plan:  Patient is doing better on Ativan 0.5 mg twice a day.  She does not need Ativan every day.  She endorse that her anxiety is situational.  She is hoping to move back from her mother's house once house is sold.  At this time I will continue Lamictal 150 mg twice a day, Wellbutrin 300 mg daily, Risperdal 1 mg at bedtime and Ativan 0.5 mg as needed.  I offered counseling but patient declined.  Recommended to call us back if she has any question or any concern.  I also reviewed blood work results with her.  I will see her again in 3 months. Time spent 25 minutes.  More than 50% of the time spent in psychoeducation, counseling and coordination of care.  Discuss safety plan that anytime having active suicidal thoughts or homicidal thoughts then patient need to call 911 or go to the local emergency room.   Patrese Neal T., MD 11/26/2013

## 2013-11-28 ENCOUNTER — Other Ambulatory Visit (HOSPITAL_COMMUNITY): Payer: Self-pay | Admitting: Psychiatry

## 2013-11-28 DIAGNOSIS — F319 Bipolar disorder, unspecified: Secondary | ICD-10-CM

## 2013-11-28 NOTE — Telephone Encounter (Signed)
Pt called asking where her medication was sent to since her pharmacy at [facility] outpatient did not receive it. Dr. Adele Schilder has sent medication refills to [facility] on 11/26/13. Called patient who states she has not had medication sent to [facility] in a long time and would like it to be removed from the system and [facility] pharmacy added as her only pharmacy. [facility].

## 2013-12-01 ENCOUNTER — Encounter (INDEPENDENT_AMBULATORY_CARE_PROVIDER_SITE_OTHER): Payer: 59 | Admitting: Physical Therapy

## 2013-12-01 DIAGNOSIS — M5441 Lumbago with sciatica, right side: Secondary | ICD-10-CM

## 2013-12-01 DIAGNOSIS — M25559 Pain in unspecified hip: Secondary | ICD-10-CM

## 2013-12-01 DIAGNOSIS — M545 Low back pain: Secondary | ICD-10-CM

## 2013-12-01 DIAGNOSIS — R5381 Other malaise: Secondary | ICD-10-CM

## 2013-12-08 ENCOUNTER — Encounter: Payer: Self-pay | Admitting: Physical Therapy

## 2013-12-15 ENCOUNTER — Encounter: Payer: Self-pay | Admitting: Physical Therapy

## 2014-03-02 ENCOUNTER — Ambulatory Visit (HOSPITAL_COMMUNITY): Payer: Self-pay | Admitting: Psychiatry

## 2014-03-03 ENCOUNTER — Ambulatory Visit (INDEPENDENT_AMBULATORY_CARE_PROVIDER_SITE_OTHER): Payer: 59 | Admitting: Psychiatry

## 2014-03-03 ENCOUNTER — Encounter (HOSPITAL_COMMUNITY): Payer: Self-pay | Admitting: Psychiatry

## 2014-03-03 VITALS — BP 120/84 | HR 89 | Ht 64.25 in

## 2014-03-03 DIAGNOSIS — F319 Bipolar disorder, unspecified: Secondary | ICD-10-CM

## 2014-03-03 DIAGNOSIS — F29 Unspecified psychosis not due to a substance or known physiological condition: Secondary | ICD-10-CM

## 2014-03-03 MED ORDER — RISPERIDONE 1 MG PO TABS: 1.0000 mg | ORAL_TABLET | Freq: Every day | ORAL | Status: DC

## 2014-03-03 MED ORDER — LORAZEPAM 0.5 MG PO TABS: 0.5000 mg | ORAL_TABLET | Freq: Every day | ORAL | Status: DC

## 2014-03-03 MED ORDER — LAMOTRIGINE 150 MG PO TABS: 150.0000 mg | ORAL_TABLET | Freq: Two times a day (BID) | ORAL | Status: DC

## 2014-03-03 MED ORDER — BUPROPION HCL ER (XL) 300 MG PO TB24: 300.0000 mg | ORAL_TABLET | Freq: Every day | ORAL | Status: DC

## 2014-03-03 NOTE — Progress Notes (Signed)
[facility] [phone] Progress Note  Fia Hebert [id number] 44 y.o.  03/03/2014 4:53 PM  Chief Complaint:   my mother died 3 weeks ago and I am feeling overwhelmed.     History of Present Illness:  Charlotte Hobbs came for her appointment.   She mentioned that her mother died 3 weeks ago because she believed could not tolerate chemotherapy.  She was diagnosed with cancer.  Patient feel sad but also believe that she does not have to go through miserable side effects.  She is trying to sell her mother's house and now she is trying to build  The new house because she believe her current house is small. Overall she is feeling much better and does not feel that she need any grief counseling.  She denies any irritability, anger, mood swing.  She was given Ativan which help her a lot and she is only taking as needed. She was to continue her current psychotropic medication.  Patient has no tremors or shakes.  She denies drinking or using any illegal substances. Her appetite is okay.  Her vitals are stable.  Suicidal Ideation: No Plan Formed: No Patient has means to carry out plan: No  Homicidal Ideation: No Plan Formed: No Patient has means to carry out plan: No  Medical History; Obesity, patient has historically low vitamin D level. Her primary care physician is Dr. Madilyn Fireman in [place].  ROS Psychiatric: Agitation: No Hallucination: No Depressed Mood: No Insomnia: No Hypersomnia: No Altered Concentration: No Feels Worthless: No Grandiose Ideas: No Belief In Special Powers: No New/Increased Substance Abuse: No Compulsions: No  Neurologic: Headache: Yes Seizure: No Paresthesias: No    Outpatient Encounter Prescriptions as of 03/03/2014  Medication Sig  . buPROPion (WELLBUTRIN XL) 300 MG 24 hr tablet Take 1 tablet (300 mg total) by mouth daily.  [name] lamoTRIgine (LAMICTAL) 150 MG tablet Take 1 tablet (150 mg total) by mouth 2 (two) times daily.  [name] LORazepam (ATIVAN) 0.5 MG  tablet Take 1 tablet (0.5 mg total) by mouth daily.  . risperiDONE (RISPERDAL) 1 MG tablet Take 1 tablet (1 mg total) by mouth at bedtime.  . triamcinolone ointment (KENALOG) 0.5 % Apply 1 application topically daily.  . [DISCONTINUED] buPROPion (WELLBUTRIN XL) 300 MG 24 hr tablet TAKE 1 TABLET BY MOUTH DAILY  . [DISCONTINUED] lamoTRIgine (LAMICTAL) 150 MG tablet TAKE 1 TABLET BY MOUTH 2 TIMES DAILY  . [DISCONTINUED] LORazepam (ATIVAN) 0.5 MG tablet Take 1 tablet (0.5 mg total) by mouth 2 (two) times daily.  . [DISCONTINUED] risperiDONE (RISPERDAL) 1 MG tablet TAKE 1 TABLET BY MOUTH AT BEDTIME    No results found for this or any previous visit (from the past 2160 hour(s)).  Past Psychiatric History/Hospitalization(s) Patient denies any previous history of psychiatric inpatient treatment or any suicidal attempt.  In the past she has given Prozac and Geodon by her primary care physician however she has side effects.  Patient endorses history of mood swings anger and irritability but denies any psychosis. Anxiety: No Bipolar Disorder: Yes Depression: Yes Mania: Yes Psychosis: No Schizophrenia: No Personality Disorder: No Hospitalization for psychiatric illness: No History of Electroconvulsive Shock Therapy: No Prior Suicide Attempts: No  Physical Exam: Constitutional:  BP 120/84 mmHg  Pulse 89  Ht 5' 4.25" (1.632 m)  Musculoskeletal: Strength & Muscle Tone: within normal limits Gait & Station: normal Patient leans: N/A  Mental Status Examination;  Patient is casually dressed and fairly groomed.  She is anxious  But cooperative. Her speech is  fast but clear and coherent.  Her thought process is logical and goal-directed.  She denies any hallucinations, paranoia or any suicidal thoughts or homicidal thoughts.  Her attention and concentration is fair.  Her psychomotor activity is slightly increased.  Her fund of knowledge is okay.  She describes her mood is anxious and her affect is  mood appropriate.  She is alert and oriented x3.  Her cognition is intact. Her insight judgment and impulse control is okay.  Established Problem, Stable/Improving (1), Review of Psycho-Social Stressors (1), Review of Last Therapy Session (1) and Review of Medication Regimen & Side Effects (2)  Assessment: Axis I: Bipolar disorder with psychotic features    Axis II: Deferred  Axis III:  Past Medical History  Diagnosis Date  . Depression     bi polar  . Mania    Plan:   reassurance given.  Recommended grief counseling but patient declined. She wants to continue her current medication which is helping her depression and manic symptoms.  She does not take Ativan every day and she only takes if needed.  In the discussion of Ativan 0.5 mg as needed as given. Continue Lamictal,  Wellbutrin and Risperdal as prescribed.  Discussed medication side effects and benefits. Recommended to call us back if she has any question, concern or if she feels worsening of the symptom. I will see her again in 3 months.   Daeton Kluth T., MD 03/03/2014

## 2014-06-12 ENCOUNTER — Other Ambulatory Visit (HOSPITAL_COMMUNITY): Payer: Self-pay | Admitting: Psychiatry

## 2014-06-19 ENCOUNTER — Telehealth (HOSPITAL_COMMUNITY): Payer: Self-pay

## 2014-06-19 DIAGNOSIS — F319 Bipolar disorder, unspecified: Secondary | ICD-10-CM

## 2014-06-19 MED ORDER — BUPROPION HCL ER (XL) 300 MG PO TB24: 300.0000 mg | ORAL_TABLET | Freq: Every day | ORAL | Status: DC

## 2014-06-19 MED ORDER — RISPERIDONE 1 MG PO TABS: 1.0000 mg | ORAL_TABLET | Freq: Every day | ORAL | Status: DC

## 2014-06-19 MED ORDER — LAMOTRIGINE 150 MG PO TABS: 150.0000 mg | ORAL_TABLET | Freq: Two times a day (BID) | ORAL | Status: DC

## 2014-06-19 NOTE — Telephone Encounter (Signed)
Medication management - patient requesting refills of Lamictal, Risperdal and Wellbutrin.  Will be in to see Dr. Adele Schilder on 07/06/14 as states she forgot to make last appointment when she left and took first available on 07/06/14. Patient states she is completely out of each and needs 90 day orders today.  Dr. Adele Schilder authorized 30 day supplies of each until patient is seen.  New 30 day supply orders e-scribed to [facility] today for patient and called patient to inform 30 day orders for her prescribed Lamictal, Risperdal and Wellbutrin e-scribed to [facility].  Reminded patient of need to keep appointment on 07/06/14.

## 2014-07-02 ENCOUNTER — Ambulatory Visit (HOSPITAL_COMMUNITY): Payer: Self-pay | Admitting: Psychiatry

## 2014-07-06 ENCOUNTER — Ambulatory Visit (INDEPENDENT_AMBULATORY_CARE_PROVIDER_SITE_OTHER): Payer: 59 | Admitting: Psychiatry

## 2014-07-06 ENCOUNTER — Encounter (HOSPITAL_COMMUNITY): Payer: Self-pay | Admitting: Psychiatry

## 2014-07-06 VITALS — BP 132/86 | HR 84

## 2014-07-06 DIAGNOSIS — F319 Bipolar disorder, unspecified: Secondary | ICD-10-CM | POA: Diagnosis not present

## 2014-07-06 DIAGNOSIS — F29 Unspecified psychosis not due to a substance or known physiological condition: Secondary | ICD-10-CM | POA: Diagnosis not present

## 2014-07-06 MED ORDER — LAMOTRIGINE 150 MG PO TABS: 150.0000 mg | ORAL_TABLET | Freq: Two times a day (BID) | ORAL | Status: DC

## 2014-07-06 MED ORDER — RISPERIDONE 1 MG PO TABS: 1.0000 mg | ORAL_TABLET | Freq: Every day | ORAL | Status: DC

## 2014-07-06 MED ORDER — BUPROPION HCL ER (XL) 300 MG PO TB24: 300.0000 mg | ORAL_TABLET | Freq: Every day | ORAL | Status: DC

## 2014-07-06 NOTE — Progress Notes (Signed)
[facility] [phone] Progress Note  Charlotte Hobbs [id number] 44 y.o.  07/06/2014 11:27 AM  Chief Complaint:  Medication management and follow-up.      History of Present Illness:  Charlotte Hobbs came for her appointment.   She is taking her medication and denies any side effects.  He described multiple psychosocial stressors she believe her medicine is working very well.  She has gained weight and she did not want to go on a scale because she feels embarrassed.  However she is hoping to get a personal trainer to lose weight.  She denies any irritability, anger or any mood swings.  Her job is going very well.  She denies any hallucination or any paranoia.  She has not taken Ativan in recent months .  She like Wellbutrin, Risperdal and Lamictal.  She has no rash or itching.  She wants to continue her current medication.  She denies drinking or using any illegal substances.  Her blood pressure is okay.  However her weight is increased but she did not go on the scale.    Suicidal Ideation: No Plan Formed: No Patient has means to carry out plan: No  Homicidal Ideation: No Plan Formed: No Patient has means to carry out plan: No  Medical History; Obesity, patient has historically low vitamin D level. Her primary care physician is Dr. Madilyn Hobbs in [place].  ROS Psychiatric: Agitation: No Hallucination: No Depressed Mood: No Insomnia: No Hypersomnia: No Altered Concentration: No Feels Worthless: No Grandiose Ideas: No Belief In Special Powers: No New/Increased Substance Abuse: No Compulsions: No  Neurologic: Headache: Yes Seizure: No Paresthesias: No    Outpatient Encounter Prescriptions as of 07/06/2014  Medication Sig  . buPROPion (WELLBUTRIN XL) 300 MG 24 hr tablet Take 1 tablet (300 mg total) by mouth daily.  [name] lamoTRIgine (LAMICTAL) 150 MG tablet Take 1 tablet (150 mg total) by mouth 2 (two) times daily.  [name] LORazepam (ATIVAN) 0.5 MG tablet Take 1 tablet (0.5 mg total)  by mouth daily.  . risperiDONE (RISPERDAL) 1 MG tablet Take 1 tablet (1 mg total) by mouth at bedtime.  . triamcinolone ointment (KENALOG) 0.5 % Apply 1 application topically daily.  . [DISCONTINUED] buPROPion (WELLBUTRIN XL) 300 MG 24 hr tablet Take 1 tablet (300 mg total) by mouth daily.  . [DISCONTINUED] lamoTRIgine (LAMICTAL) 150 MG tablet Take 1 tablet (150 mg total) by mouth 2 (two) times daily.  . [DISCONTINUED] risperiDONE (RISPERDAL) 1 MG tablet Take 1 tablet (1 mg total) by mouth at bedtime.   No facility-administered encounter medications on file as of 07/06/2014.    No results found for this or any previous visit (from the past 2160 hour(s)).  Past Psychiatric History/Hospitalization(s) Patient denies any previous history of psychiatric inpatient treatment or any suicidal attempt.  In the past she has given Prozac and Geodon by her primary care physician however she has side effects.  Patient endorses history of mood swings anger and irritability but denies any psychosis. Anxiety: No Bipolar Disorder: Yes Depression: Yes Mania: Yes Psychosis: No Schizophrenia: No Personality Disorder: No Hospitalization for psychiatric illness: No History of Electroconvulsive Shock Therapy: No Prior Suicide Attempts: No  Physical Exam: Constitutional:  BP 132/86 mmHg  Pulse 84  Wt   LMP 07/04/2014 (Exact Date)  Musculoskeletal: Strength & Muscle Tone: within normal limits Gait & Station: normal Patient leans: N/A  Mental Status Examination;  Patient is casually dressed and fairly groomed.  She maintained fair eye contact.  Her thought process is  logical and goal-directed.  She denies any hallucinations, paranoia or any suicidal thoughts or homicidal thoughts.  Her attention and concentration is fair.  Her psychomotor activity is slightly increased.  Her fund of knowledge is okay.  She describes her mood is anxious and her affect is mood appropriate.  She is alert and oriented x3.  Her  cognition is intact. Her insight judgment and impulse control is okay.  Established Problem, Stable/Improving (1), Review of Psycho-Social Stressors (1), Review of Last Therapy Session (1) and Review of Medication Regimen & Side Effects (2)  Assessment: Axis I: Bipolar disorder with psychotic features    Axis II: Deferred  Axis III:  Past Medical History  Diagnosis Date  . Depression     bi polar  . Mania    Plan:  I offered counseling but patient refused.  She feel her medicine is working very well.  I discuss weight gain issue but patient told that she is looking into personal trainer and she will start working to lose weight.  She does not need Ativan because she believe her medicine is working very well.  I will continue Wellbutrin XL 300 mg daily, Lamictal 150 mg twice a day and Risperdal 1 mg at bedtime.  She has no rash, itching or any EPS.  Recommended to call us back if she has any question or any concern.  Follow-up in 6 months.   Charlotte Hobbs T., MD 07/06/2014

## 2014-08-05 ENCOUNTER — Telehealth (HOSPITAL_COMMUNITY): Payer: Self-pay

## 2014-08-05 NOTE — Telephone Encounter (Signed)
Medication refills - Fax received from patient's [facility] requesting 90 day orders for patient's medications; Lamictal, Wellbutrin XL and Risperdal.

## 2014-08-06 ENCOUNTER — Other Ambulatory Visit (HOSPITAL_COMMUNITY): Payer: Self-pay | Admitting: Psychiatry

## 2014-08-06 NOTE — Telephone Encounter (Signed)
She still has refill until she see on her next appointment.  We will discuss 90 day supply on her next appointment.

## 2014-10-05 ENCOUNTER — Ambulatory Visit (INDEPENDENT_AMBULATORY_CARE_PROVIDER_SITE_OTHER): Payer: 59 | Admitting: Family Medicine

## 2014-10-05 ENCOUNTER — Encounter: Payer: Self-pay | Admitting: Family Medicine

## 2014-10-05 VITALS — BP 129/79 | HR 83 | Ht 65.25 in | Wt 225.0 lb

## 2014-10-05 DIAGNOSIS — L409 Psoriasis, unspecified: Secondary | ICD-10-CM

## 2014-10-05 MED ORDER — CALCIPOTRIENE 0.005 % EX CREA: TOPICAL_CREAM | Freq: Two times a day (BID) | CUTANEOUS | Status: DC

## 2014-10-05 MED ORDER — CLOBETASOL PROP EMOLLIENT BASE 0.05 % EX CREA: 1.0000 "application " | TOPICAL_CREAM | Freq: Every day | CUTANEOUS | Status: DC | PRN

## 2014-10-05 MED ORDER — APREMILAST 10 & 20 & 30 MG PO TBPK: ORAL_TABLET | ORAL | Status: DC

## 2014-10-05 NOTE — Progress Notes (Signed)
   Subjective:    Patient ID: Charlotte Hobbs, female    DOB: 19-Dec-1970, 44 y.o.   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  Says it is on he knees, ankles . But now on her scalp, and face and on her hands. She is most concerned about the hands because she does work in healthcare and in the wintertime this causes a lot of skin breakdown and potential for open wounds. She did a little research on her own about treatment options. She is interested in [place].  She says she is not interested in methotrexate or other biologicals. And she's not getting a great response to the topical steroid.   Review of Systems     Objective:   Physical Exam  Constitutional: She is oriented to person, place, and time. She appears well-developed and well-nourished.  HENT:  Head: Normocephalic and atraumatic.  Cardiovascular: Normal rate, regular rhythm and normal heart sounds.   Pulmonary/Chest: Effort normal and breath sounds normal.  Neurological: She is alert and oriented to person, place, and time.  Skin: Skin is warm and dry.  Psychiatric: She has a normal mood and affect. Her behavior is normal.          Assessment & Plan:  Psoriasis. Discussed several options including switching to a different topical steroid, adding Dovonex, versus using a biologic versus methotrexate. We also discussed the new medication Otezla. Actually has a fairly good side effects profile most of which are GI symptoms such as abdominal pain nausea vomiting diarrhea loose stools. We'll also need to monitor for weight loss and will need to get a baseline kidney function as well. She would like to start the medication. Starter pack since the pharmacy. After that we will change to maintenance dose of 30 mg twice a day. I did go ahead and change her triamcinolone to clobetasol that she cannot use this on her face. And will add Dovonex as well.

## 2014-10-06 ENCOUNTER — Other Ambulatory Visit: Payer: Self-pay | Admitting: *Deleted

## 2014-10-06 LAB — BASIC METABOLIC PANEL WITH GFR
BUN: 11 mg/dL (ref 7–25)
CALCIUM: 8.4 mg/dL — AB (ref 8.6–10.2)
CO2: 24 mmol/L (ref 20–31)
CREATININE: 0.85 mg/dL (ref 0.50–1.10)
Chloride: 106 mmol/L (ref 98–110)
GFR, Est African American: >89 mL/min (ref >=60)
GFR, Est Non African American: 84 mL/min (ref >=60)
Glucose, Bld: 90 mg/dL (ref 65–99)
Potassium: 4.5 mmol/L (ref 3.5–5.3)
SODIUM: 141 mmol/L (ref 135–146)

## 2014-10-13 ENCOUNTER — Telehealth: Payer: Self-pay | Admitting: Family Medicine

## 2014-10-13 NOTE — Telephone Encounter (Signed)
Received fax on [facility] sent through cover my meds waiting on authorization. - CF

## 2014-10-19 ENCOUNTER — Telehealth: Payer: Self-pay | Admitting: Family Medicine

## 2014-10-19 NOTE — Telephone Encounter (Signed)
Call pt:  Received fax from [facility] and they denied coverage on Otezla due to it was not prescribed by a dermatologist and patient has not tried and failed Humira or Stelara. -

## 2014-10-19 NOTE — Telephone Encounter (Signed)
Received fax from [facility] and they denied coverage on Otezla due to it was not prescribed by a dermatologist and patient has not tried and failed Humira or Stelara. - CF

## 2014-10-22 NOTE — Telephone Encounter (Signed)
lvm informing pt of denial of medication.Charlotte Hobbs [place]

## 2014-10-28 ENCOUNTER — Other Ambulatory Visit (HOSPITAL_COMMUNITY): Payer: Self-pay | Admitting: Psychiatry

## 2014-10-28 DIAGNOSIS — F3162 Bipolar disorder, current episode mixed, moderate: Secondary | ICD-10-CM

## 2014-10-30 NOTE — Telephone Encounter (Signed)
Telephone call with patient requesting new refill orders for her prescribed Wellbutrin and Lamictal and also patient requested this nurse ask Dr. Adele Schilder if he would be willing to restart her on Lorazepam that she had taken in the past or would this have to be discussed at next evaluation.  Dr. Adele Schilder authorized refills of patient's prescribed Lamictal and Wellbutrin, new orders plus 2 refills to last patient until she returns to see him on 01/05/15 but he did not agree to restart patient on Lorazepam without evaluating her and discussing.  Agreed to inform patient of this decision and new orders for Lamictal and Wellbutrin XL e-scribed to patient's [place] as approved. Called patient back to relay message from Dr. Adele Schilder and offered to assist patient with an earlier appointment if that was what she desired and patient stated she did want to come in earlier to discuss why requesting to restart Lorazepam with Dr. Adele Schilder.  Patient scheduled for an opening on Monday 11/02/14 at 10:00am and agreed with earlier appointment time.

## 2014-11-02 ENCOUNTER — Encounter (HOSPITAL_COMMUNITY): Payer: Self-pay | Admitting: Psychiatry

## 2014-11-02 ENCOUNTER — Ambulatory Visit (INDEPENDENT_AMBULATORY_CARE_PROVIDER_SITE_OTHER): Payer: 59 | Admitting: Psychiatry

## 2014-11-02 VITALS — BP 124/86 | HR 96 | Ht 64.25 in | Wt 228.0 lb

## 2014-11-02 DIAGNOSIS — F319 Bipolar disorder, unspecified: Secondary | ICD-10-CM

## 2014-11-02 DIAGNOSIS — F419 Anxiety disorder, unspecified: Secondary | ICD-10-CM

## 2014-11-02 DIAGNOSIS — F3162 Bipolar disorder, current episode mixed, moderate: Secondary | ICD-10-CM

## 2014-11-02 MED ORDER — LAMOTRIGINE 150 MG PO TABS: ORAL_TABLET | ORAL | Status: DC

## 2014-11-02 MED ORDER — RISPERIDONE 0.5 MG PO TABS: 0.5000 mg | ORAL_TABLET | Freq: Every day | ORAL | Status: DC

## 2014-11-02 MED ORDER — BUPROPION HCL ER (XL) 300 MG PO TB24: 300.0000 mg | ORAL_TABLET | Freq: Every day | ORAL | Status: DC

## 2014-11-02 MED ORDER — LORAZEPAM 0.5 MG PO TABS: 0.5000 mg | ORAL_TABLET | ORAL | Status: AC | PRN

## 2014-11-02 NOTE — Progress Notes (Signed)
[facility] [phone] Progress Note  Charlotte Hobbs [id number] 44 y.o.  11/02/2014 11:07 AM  Chief Complaint:  I am more anxious and nervous.  I like to have Ativan before the holidays.        History of Present Illness:  Charlotte Hobbs came for her appointment.   She is compliant with her psychiatric medication and denies any side effects.  Recently she has noticed increased anxiety and nervousness .  She is worried about holidays this is her first holidays after her mother died.  She admitted having irritability, anxiety and easily crying.  She is also started a new relationship but reported her relationship is going very well.  Her daughter is also doing better but she does not like when her daughter disrespect her sometimes.  She sleeping on and off.  She is cut down Risperdal and taking half tablet because she read about side effects especially weight gain.  She admitted 3 pound weight gain since the last visit.  She denies any major mood swing, anger, aggressive behavior.  Her energy level is okay.  She denies any paranoia or any hallucination.  However she is still have at times crying spells.  She has no itching or rash.  Patient denies drinking or using any illegal substances.  She has cut down her work and only doing 4 days a week and that helped.  Her appetite is okay.  Her vitals are stable.  Recently she's seen her primary care physician and her blood work was done.  She has low calcium worker chemistry was normal.  Suicidal Ideation: No Plan Formed: No Patient has means to carry out plan: No  Homicidal Ideation: No Plan Formed: No Patient has means to carry out plan: No  Medical History; Obesity, patient has historically low vitamin D level. Her primary care physician is Dr. Madilyn Fireman in [place].  Review of Systems  Constitutional: Negative for weight loss and malaise/fatigue.  Cardiovascular: Negative for chest pain and palpitations.  Skin: Negative for itching and rash.   Neurological: Positive for tremors. Negative for dizziness and headaches.  Psychiatric/Behavioral: Negative for suicidal ideas. The patient is nervous/anxious and has insomnia.    Psychiatric: Agitation: No Hallucination: No Depressed Mood: No Insomnia: No Hypersomnia: No Altered Concentration: No Feels Worthless: No Grandiose Ideas: No Belief In Special Powers: No New/Increased Substance Abuse: No Compulsions: No  Neurologic: Headache: No Seizure: No Paresthesias: No    Outpatient Encounter Prescriptions as of 11/02/2014  Medication Sig  . buPROPion (WELLBUTRIN XL) 300 MG 24 hr tablet Take 1 tablet (300 mg total) by mouth daily.  [name] lamoTRIgine (LAMICTAL) 150 MG tablet TAKE 1 TABLET BY MOUTH 2 TIMES DAILY.  [name] risperiDONE (RISPERDAL) 0.5 MG tablet Take 1 tablet (0.5 mg total) by mouth at bedtime.  . [DISCONTINUED] buPROPion (WELLBUTRIN XL) 300 MG 24 hr tablet TAKE 1 TABLET BY MOUTH DAILY.  . [DISCONTINUED] lamoTRIgine (LAMICTAL) 150 MG tablet TAKE 1 TABLET BY MOUTH 2 TIMES DAILY.  . [DISCONTINUED] risperiDONE (RISPERDAL) 1 MG tablet Take 1 tablet (1 mg total) by mouth at bedtime.  [name] Apremilast (OTEZLA) 10 & 20 & 30 MG TBPK Take as directed.  . calcipotriene (DOVONOX) 0.005 % cream Apply topically 2 (two) times daily.  . Clobetasol Prop Emollient Base (CLOBETASOL PROPIONATE E) 0.05 % emollient cream Apply 1 application topically daily as needed.  [name] LORazepam (ATIVAN) 0.5 MG tablet Take 1 tablet (0.5 mg total) by mouth as needed for anxiety.   No facility-administered encounter  medications on file as of 11/02/2014.    Recent Results (from the past 2160 hour(s))  BASIC METABOLIC PANEL WITH GFR     Status: Abnormal   Collection Time: 10/05/14  9:57 AM  Result Value Ref Range   Sodium 141 135 - 146 mmol/L   Potassium 4.5 3.5 - 5.3 mmol/L   Chloride 106 98 - 110 mmol/L   CO2 24 20 - 31 mmol/L   Glucose, Bld 90 65 - 99 mg/dL   BUN 11 7 - 25 mg/dL   Creat 0.85 0.50 - 1.10  mg/dL   Calcium 8.4 (L) 8.6 - 10.2 mg/dL   GFR, Est African American >89 >=60 mL/min   GFR, Est Non African American 84 >=60 mL/min    Comment:   The estimated GFR is a calculation valid for adults (>=63 years old) that uses the CKD-EPI algorithm to adjust for age and sex. It is   not to be used for children, pregnant women, hospitalized patients,    patients on dialysis, or with rapidly changing kidney function. According to the NKDEP, eGFR >89 is normal, 60-89 shows mild impairment, 30-59 shows moderate impairment, 15-29 shows severe impairment and <15 is ESRD.     Footnotes:  (1) ** Please note change in unit of measure and reference range(s). **       Past Psychiatric History/Hospitalization(s) Patient denies any previous history of psychiatric inpatient treatment or any suicidal attempt.  In the past she has given Prozac and Geodon by her primary care physician however she has side effects.  Patient endorses history of mood swings anger and irritability but denies any psychosis. Anxiety: No Bipolar Disorder: Yes Depression: Yes Mania: Yes Psychosis: No Schizophrenia: No Personality Disorder: No Hospitalization for psychiatric illness: No History of Electroconvulsive Shock Therapy: No Prior Suicide Attempts: No  Physical Exam: Constitutional:  BP 124/86 mmHg  Pulse 96  Ht 5' 4.25" (1.632 m)  Wt 228 lb (103.42 kg)  BMI 38.83 kg/m2  Musculoskeletal: Strength & Muscle Tone: within normal limits Gait & Station: normal Patient leans: N/A  Mental Status Examination;  Patient is casually dressed and fairly groomed.  She maintained fair eye contact.  She appears emotional but cooperative.  She described her mood anxious and nervous and her affect is mood appropriate.  Her thought process is logical and goal-directed.  She denies any hallucinations, paranoia or any suicidal thoughts or homicidal thoughts.  Her attention and concentration is fair.  Her psychomotor activity  is slightly increased.  Her fund of knowledge is okay.  She is alert and oriented x3.  Her cognition is intact. Her insight judgment and impulse control is okay.  Established Problem, Stable/Improving (1), Review of Psycho-Social Stressors (1), Review or order clinical lab tests (1), Established Problem, Worsening (2), Review of Last Therapy Session (1), Review of Medication Regimen & Side Effects (2) and Review of New Medication or Change in Dosage (2)  Assessment: Axis I: Bipolar disorder with psychotic features , anxiety disorder NOS  Axis II: Deferred  Axis III:  Past Medical History  Diagnosis Date  . Depression     bi polar  . Mania [facility])    Plan:  Patient had tried low-dose lorazepam in the past which help her anxiety.  She is more anxious and nervous before holidays as this is her first holiday season after her mother deceased.  She wants to have lorazepam just in case if she needed.  I reviewed her blood work results and encourage to  watch her calorie intake and to regular exercise.  Discuss weight gain .  Discussed medication side effects and benefits.  Continue Wellbutrin XL 300 mg daily, Lamictal 150 mg twice a day and Risperdal 0.5 mg at bedtime.  She is no longer taking 1 mg Risperdal due to concerns of side effects especially weight gain.  At this time she does not have any shakes, tremors, EPS, rash, itching or any headaches.  Discussed medication side effects especially benzodiazepine dependency.  One more time I offered counseling but patient declined.  Recommended to call us back if she has any question or any concern.  Follow-up in 3 months.  Time spent 25 minutes.  More than 50% of the time spent in psychoeducation, counseling and coordination of care.   Dani Danis T., MD 11/02/2014

## 2015-01-04 ENCOUNTER — Ambulatory Visit: Payer: Self-pay | Admitting: Family Medicine

## 2015-01-05 ENCOUNTER — Ambulatory Visit (HOSPITAL_COMMUNITY): Payer: Self-pay | Admitting: Psychiatry

## 2015-02-01 DIAGNOSIS — F3170 Bipolar disorder, currently in remission, most recent episode unspecified: Secondary | ICD-10-CM | POA: Diagnosis not present

## 2015-02-02 ENCOUNTER — Ambulatory Visit (HOSPITAL_COMMUNITY): Payer: Self-pay | Admitting: Psychiatry

## 2015-02-18 MED FILL — LATUDA 40 MG TABLET: 40 | 30 days supply | Qty: 30 | Fill #0

## 2015-02-26 MED FILL — lamoTRIgine 150 MG TABS: 150 | 30 days supply | Qty: 60 | Fill #0

## 2015-03-01 DIAGNOSIS — F3170 Bipolar disorder, currently in remission, most recent episode unspecified: Secondary | ICD-10-CM | POA: Diagnosis not present

## 2015-03-02 MED FILL — BUPROPION HCL XL 300 MG TAB: 300 | 30 days supply | Qty: 30 | Fill #0

## 2015-03-22 DIAGNOSIS — F3170 Bipolar disorder, currently in remission, most recent episode unspecified: Secondary | ICD-10-CM | POA: Diagnosis not present

## 2015-03-23 MED FILL — BUPROPION HCL XL 300 MG TAB: 300 | 30 days supply | Qty: 30 | Fill #1

## 2015-04-01 MED FILL — lamoTRIgine 150 MG TABS: 150 | 30 days supply | Qty: 60 | Fill #1

## 2015-04-22 MED FILL — BUPROPION HCL XL 300 MG TAB: 300 | 30 days supply | Qty: 30 | Fill #2

## 2015-04-26 ENCOUNTER — Other Ambulatory Visit: Payer: Self-pay | Admitting: *Deleted

## 2015-04-26 MED ORDER — CLOBETASOL PROP EMOLLIENT BASE 0.05 % EX CREA: 1.0000 "application " | TOPICAL_CREAM | Freq: Every day | CUTANEOUS | Status: DC | PRN

## 2015-04-27 MED FILL — CLOBETASOL EMOLLIENT 0.05%: 0.05 | 15 days supply | Qty: 30 | Fill #0

## 2015-05-04 MED FILL — lamoTRIgine 150 MG TABS: 150 | 30 days supply | Qty: 60 | Fill #2

## 2015-05-24 DIAGNOSIS — F319 Bipolar disorder, unspecified: Secondary | ICD-10-CM | POA: Diagnosis not present

## 2015-05-28 MED FILL — BUPROPION HCL XL 300 MG TAB: 300 | 30 days supply | Qty: 30 | Fill #0

## 2015-05-28 MED FILL — LATUDA 20 MG TABLET: 20 | 30 days supply | Qty: 30 | Fill #0

## 2015-06-08 MED FILL — lamoTRIgine 150 MG TABS: 150 | 30 days supply | Qty: 60 | Fill #0

## 2015-06-21 DIAGNOSIS — F319 Bipolar disorder, unspecified: Secondary | ICD-10-CM | POA: Diagnosis not present

## 2015-07-01 MED FILL — BUPROPION HCL XL 300 MG TAB: 300 | 30 days supply | Qty: 30 | Fill #1

## 2015-07-08 MED FILL — lamoTRIgine 150 MG TABS: 150 | 30 days supply | Qty: 60 | Fill #1

## 2015-08-03 MED FILL — lamoTRIgine 150 MG TABS: 150 | 30 days supply | Qty: 60 | Fill #0

## 2015-08-03 MED FILL — BUPROPION HCL XL 300 MG TAB: 300 | 30 days supply | Qty: 30 | Fill #0

## 2015-08-16 DIAGNOSIS — F319 Bipolar disorder, unspecified: Secondary | ICD-10-CM | POA: Diagnosis not present

## 2015-08-17 MED FILL — METFORMIN HCL ER 500 MG TAB: 500 | 30 days supply | Qty: 60 | Fill #0

## 2015-08-17 MED FILL — risperiDONE 1 MG TABS: 1 | 30 days supply | Qty: 30 | Fill #0

## 2015-09-03 MED FILL — lamoTRIgine 150 MG TABS: 150 | 30 days supply | Qty: 60 | Fill #1

## 2015-09-03 MED FILL — BUPROPION HCL XL 300 MG TAB: 300 | 30 days supply | Qty: 30 | Fill #1

## 2015-09-23 MED FILL — METFORMIN HCL ER 500 MG TAB: 500 | 30 days supply | Qty: 60 | Fill #0

## 2015-09-23 MED FILL — risperiDONE 1 MG TABS: 1 | 30 days supply | Qty: 30 | Fill #1

## 2015-09-27 DIAGNOSIS — F319 Bipolar disorder, unspecified: Secondary | ICD-10-CM | POA: Diagnosis not present

## 2015-10-01 MED FILL — BUPROPION HCL XL 300 MG TAB: 300 | 30 days supply | Qty: 30 | Fill #2

## 2015-10-13 MED FILL — lamoTRIgine 150 MG TABS: 150 | 30 days supply | Qty: 60 | Fill #2

## 2015-10-21 MED FILL — METFORMIN HCL ER 500 MG TAB: 500 | 30 days supply | Qty: 60 | Fill #1

## 2015-10-21 MED FILL — risperiDONE 1 MG TABS: 1 | 30 days supply | Qty: 30 | Fill #2

## 2015-11-04 MED FILL — BUPROPION HCL XL 300 MG TAB: 300 | 30 days supply | Qty: 30 | Fill #3

## 2015-11-08 DIAGNOSIS — F319 Bipolar disorder, unspecified: Secondary | ICD-10-CM | POA: Diagnosis not present

## 2015-11-12 MED FILL — lamoTRIgine 150 MG TABS: 150 | 30 days supply | Qty: 60 | Fill #3

## 2015-11-26 MED FILL — METFORMIN HCL ER 500 MG TAB: 500 | 30 days supply | Qty: 60 | Fill #2

## 2015-11-26 MED FILL — risperiDONE 1 MG TABS: 1 | 30 days supply | Qty: 30 | Fill #3

## 2015-12-07 MED FILL — BUPROPION HCL XL 300 MG TAB: 300 | 30 days supply | Qty: 30 | Fill #0

## 2015-12-15 MED FILL — lamoTRIgine 150 MG TABS: 150 | 30 days supply | Qty: 60 | Fill #0

## 2015-12-28 MED FILL — risperiDONE 1 MG TABS: 1 | 30 days supply | Qty: 30 | Fill #0

## 2015-12-28 MED FILL — METFORMIN HCL ER 500 MG TAB: 500 | 30 days supply | Qty: 60 | Fill #3

## 2016-01-06 MED FILL — BUPROPION HCL XL 300 MG TAB: 300 | 30 days supply | Qty: 30 | Fill #1

## 2016-01-13 MED FILL — lamoTRIgine 150 MG TABS: 150 | 30 days supply | Qty: 60 | Fill #1

## 2016-01-26 MED FILL — risperiDONE 1 MG TABS: 1 | 30 days supply | Qty: 30 | Fill #1

## 2016-01-27 MED FILL — METFORMIN HCL ER 500 MG TAB: 500 | 30 days supply | Qty: 60 | Fill #0

## 2016-02-08 MED FILL — lamoTRIgine 150 MG TABS: 150 | 30 days supply | Qty: 60 | Fill #2

## 2016-02-08 MED FILL — BUPROPION HCL XL 300 MG TAB: 300 | 30 days supply | Qty: 30 | Fill #2

## 2016-02-21 DIAGNOSIS — F319 Bipolar disorder, unspecified: Secondary | ICD-10-CM | POA: Diagnosis not present

## 2016-02-24 MED FILL — LORazepam 1 MG TABS: 1 | 30 days supply | Qty: 30 | Fill #0

## 2016-02-24 MED FILL — risperiDONE 1 MG TABS: 1 | 30 days supply | Qty: 30 | Fill #0

## 2016-02-24 MED FILL — lamoTRIgine 200 MG TABS: 200 | 30 days supply | Qty: 60 | Fill #0

## 2016-02-24 MED FILL — METFORMIN HCL ER 500 MG TAB: 500 | 30 days supply | Qty: 60 | Fill #0

## 2016-03-20 DIAGNOSIS — F319 Bipolar disorder, unspecified: Secondary | ICD-10-CM | POA: Diagnosis not present

## 2016-03-28 MED FILL — risperiDONE 1 MG TABS: 1 | 30 days supply | Qty: 30 | Fill #1

## 2016-03-28 MED FILL — METFORMIN HCL ER 500 MG TAB: 500 | 30 days supply | Qty: 60 | Fill #1

## 2016-03-28 MED FILL — lamoTRIgine 200 MG TABS: 200 | 30 days supply | Qty: 60 | Fill #1

## 2016-04-10 MED FILL — BUPROPION HCL XL 300 MG TAB: 300 | 30 days supply | Qty: 30 | Fill #3

## 2016-04-27 MED FILL — risperiDONE 1 MG TABS: 1 | 30 days supply | Qty: 30 | Fill #2

## 2016-04-27 MED FILL — METFORMIN HCL ER 500 MG TAB: 500 | 30 days supply | Qty: 60 | Fill #1

## 2016-04-28 MED FILL — lamoTRIgine 200 MG TABS: 200 | 30 days supply | Qty: 60 | Fill #0

## 2016-05-11 MED FILL — BUPROPION HCL XL 300 MG TAB: 300 | 30 days supply | Qty: 30 | Fill #0

## 2016-06-01 MED FILL — risperiDONE 1 MG TABS: 1 | 30 days supply | Qty: 30 | Fill #3

## 2016-06-01 MED FILL — lamoTRIgine 200 MG TABS: 200 | 30 days supply | Qty: 60 | Fill #1

## 2016-06-01 MED FILL — METFORMIN HCL ER 500 MG TAB: 500 | 30 days supply | Qty: 60 | Fill #2

## 2016-06-05 DIAGNOSIS — L408 Other psoriasis: Secondary | ICD-10-CM | POA: Diagnosis not present

## 2016-06-07 MED FILL — BUPROPION HCL XL 300 MG TAB: 300 | 30 days supply | Qty: 30 | Fill #1

## 2016-06-07 MED FILL — CLOBETASOL 0.05% CREAM: 0.05 | 15 days supply | Qty: 30 | Fill #0

## 2016-06-19 DIAGNOSIS — F319 Bipolar disorder, unspecified: Secondary | ICD-10-CM | POA: Diagnosis not present

## 2016-06-28 MED FILL — METFORMIN HCL ER 500 MG TAB: 500 | 30 days supply | Qty: 60 | Fill #0

## 2016-06-28 MED FILL — PRAMIPEXOLE 0.25 MG TABLET: 0.25 | 30 days supply | Qty: 90 | Fill #0

## 2016-06-28 MED FILL — lamoTRIgine 200 MG TABS: 200 | 30 days supply | Qty: 60 | Fill #0

## 2016-06-28 MED FILL — ONDANSETRON HCL 4 MG TABLET: 4 | 10 days supply | Qty: 30 | Fill #0

## 2016-06-28 MED FILL — risperiDONE 1 MG TABS: 1 | 30 days supply | Qty: 30 | Fill #0

## 2016-07-11 MED FILL — BUPROPION HCL XL 300 MG TAB: 300 | 30 days supply | Qty: 30 | Fill #2

## 2016-07-27 MED FILL — risperiDONE 1 MG TABS: 1 | 30 days supply | Qty: 30 | Fill #1

## 2016-07-27 MED FILL — lamoTRIgine 200 MG TABS: 200 | 30 days supply | Qty: 60 | Fill #1

## 2016-07-27 MED FILL — METFORMIN HCL ER 500 MG TAB: 500 | 30 days supply | Qty: 60 | Fill #1

## 2016-08-07 MED FILL — BUPROPION HCL XL 300 MG TAB: 300 | 30 days supply | Qty: 30 | Fill #3

## 2016-08-31 MED FILL — risperiDONE 1 MG TABS: 1 | 30 days supply | Qty: 30 | Fill #0

## 2016-08-31 MED FILL — METFORMIN HCL ER 500 MG TAB: 500 | 30 days supply | Qty: 60 | Fill #3

## 2016-08-31 MED FILL — lamoTRIgine 200 MG TABS: 200 | 30 days supply | Qty: 60 | Fill #2

## 2016-09-11 DIAGNOSIS — F319 Bipolar disorder, unspecified: Secondary | ICD-10-CM | POA: Diagnosis not present

## 2016-09-14 MED FILL — BUPROPION HCL XL 300 MG TAB: 300 | 30 days supply | Qty: 30 | Fill #0

## 2016-10-03 MED FILL — lamoTRIgine 200 MG TABS: 200 | 30 days supply | Qty: 60 | Fill #3

## 2016-10-03 MED FILL — METFORMIN HCL ER 500 MG TAB: 500 | 30 days supply | Qty: 60 | Fill #0

## 2016-10-03 MED FILL — risperiDONE 1 MG TABS: 1 | 30 days supply | Qty: 30 | Fill #1

## 2016-10-13 MED FILL — BUPROPION HCL XL 300 MG TAB: 300 | 30 days supply | Qty: 30 | Fill #1

## 2016-10-16 DIAGNOSIS — L408 Other psoriasis: Secondary | ICD-10-CM | POA: Diagnosis not present

## 2016-10-17 MED FILL — CLOBETASOL 0.05% CREAM: 0.05 | 15 days supply | Qty: 30 | Fill #1

## 2016-11-01 MED FILL — risperiDONE 1 MG TABS: 1 | 30 days supply | Qty: 30 | Fill #2

## 2016-11-01 MED FILL — lamoTRIgine 200 MG TABS: 200 | 30 days supply | Qty: 60 | Fill #0

## 2016-11-01 MED FILL — METFORMIN HCL ER 500 MG TAB: 500 | 30 days supply | Qty: 60 | Fill #1

## 2016-11-14 MED FILL — BUPROPION HCL XL 300 MG TAB: 300 | 30 days supply | Qty: 30 | Fill #2

## 2016-11-30 MED FILL — lamoTRIgine 200 MG TABS: 200 | 30 days supply | Qty: 60 | Fill #1

## 2016-11-30 MED FILL — risperiDONE 1 MG TABS: 1 | 30 days supply | Qty: 30 | Fill #3

## 2016-11-30 MED FILL — METFORMIN HCL ER 500 MG TAB: 500 | 30 days supply | Qty: 60 | Fill #2

## 2016-12-11 DIAGNOSIS — F319 Bipolar disorder, unspecified: Secondary | ICD-10-CM | POA: Diagnosis not present

## 2016-12-13 MED FILL — BUPROPION HCL XL 300 MG TAB: 300 | 30 days supply | Qty: 30 | Fill #3

## 2016-12-18 MED FILL — UNIFINE PENTIPS 31GX3/16": 31G X 5 MM | 90 days supply | Qty: 100 | Fill #0

## 2016-12-18 MED FILL — UNIFINE PENTIPS 31GX3/16: 31G X 5 MM | 90 days supply | Qty: 100 | Fill #0

## 2016-12-19 MED FILL — SAXENDA 18 MG/3 ML PEN: 18 | 27 days supply | Qty: 6 | Fill #0

## 2017-01-02 MED FILL — risperiDONE 1 MG TABS: 1 | 30 days supply | Qty: 30 | Fill #0

## 2017-01-02 MED FILL — lamoTRIgine 200 MG TABS: 200 | 30 days supply | Qty: 60 | Fill #2

## 2017-01-02 MED FILL — METFORMIN HCL ER 500 MG TAB: 500 | 30 days supply | Qty: 60 | Fill #3

## 2017-01-11 MED FILL — BUPROPION HCL XL 300 MG TAB: 300 | 30 days supply | Qty: 30 | Fill #0

## 2017-01-15 DIAGNOSIS — E559 Vitamin D deficiency, unspecified: Secondary | ICD-10-CM | POA: Diagnosis not present

## 2017-01-15 DIAGNOSIS — Z79899 Other long term (current) drug therapy: Secondary | ICD-10-CM | POA: Diagnosis not present

## 2017-01-15 DIAGNOSIS — F3170 Bipolar disorder, currently in remission, most recent episode unspecified: Secondary | ICD-10-CM | POA: Diagnosis not present

## 2017-01-17 MED FILL — SAXENDA 18 MG/3 ML PEN: 18 | 30 days supply | Qty: 15 | Fill #0

## 2017-01-22 DIAGNOSIS — F319 Bipolar disorder, unspecified: Secondary | ICD-10-CM | POA: Diagnosis not present

## 2017-02-01 MED FILL — lamoTRIgine 200 MG TABS: 200 | 30 days supply | Qty: 60 | Fill #3

## 2017-02-01 MED FILL — risperiDONE 1 MG TABS: 1 | 30 days supply | Qty: 30 | Fill #1

## 2017-02-08 MED FILL — METFORMIN HCL ER 500 MG TAB: 500 | 30 days supply | Qty: 60 | Fill #0

## 2017-02-15 MED FILL — BUPROPION HCL XL 300 MG TAB: 300 | 30 days supply | Qty: 30 | Fill #1

## 2017-02-28 MED FILL — risperiDONE 1 MG TABS: 1 | 30 days supply | Qty: 30 | Fill #0

## 2017-02-28 MED FILL — lamoTRIgine 200 MG TABS: 200 | 30 days supply | Qty: 60 | Fill #0

## 2017-03-05 MED FILL — METFORMIN HCL ER 500 MG TAB: 500 | 60 days supply | Qty: 60 | Fill #0

## 2017-03-15 MED FILL — BUPROPION HCL XL 300 MG TAB: 300 | 30 days supply | Qty: 30 | Fill #0

## 2017-03-26 DIAGNOSIS — Z01419 Encounter for gynecological examination (general) (routine) without abnormal findings: Secondary | ICD-10-CM | POA: Diagnosis not present

## 2017-03-26 DIAGNOSIS — N920 Excessive and frequent menstruation with regular cycle: Secondary | ICD-10-CM | POA: Diagnosis not present

## 2017-03-26 DIAGNOSIS — Z01411 Encounter for gynecological examination (general) (routine) with abnormal findings: Secondary | ICD-10-CM | POA: Diagnosis not present

## 2017-03-26 DIAGNOSIS — Z6838 Body mass index (BMI) 38.0-38.9, adult: Secondary | ICD-10-CM | POA: Diagnosis not present

## 2017-03-28 MED FILL — lamoTRIgine 200 MG TABS: 200 | 30 days supply | Qty: 60 | Fill #0

## 2017-03-28 MED FILL — risperiDONE 1 MG TABS: 1 | 30 days supply | Qty: 30 | Fill #0

## 2017-03-29 LAB — HM PAP SMEAR: HM Pap smear: NEGATIVE

## 2017-04-09 DIAGNOSIS — F319 Bipolar disorder, unspecified: Secondary | ICD-10-CM | POA: Diagnosis not present

## 2017-04-09 MED FILL — BUPROPION HCL XL 300 MG TAB: 300 | 30 days supply | Qty: 30 | Fill #0

## 2017-04-10 MED FILL — SAXENDA 18 MG/3 ML PEN: 18 | 30 days supply | Qty: 15 | Fill #1

## 2017-04-13 MED FILL — METFORMIN HCL ER 500 MG TAB: 500 | 60 days supply | Qty: 120 | Fill #0

## 2017-04-18 DIAGNOSIS — N938 Other specified abnormal uterine and vaginal bleeding: Secondary | ICD-10-CM | POA: Diagnosis not present

## 2017-04-18 DIAGNOSIS — N939 Abnormal uterine and vaginal bleeding, unspecified: Secondary | ICD-10-CM | POA: Diagnosis not present

## 2017-04-30 MED FILL — risperiDONE 1 MG TABS: 1 | 30 days supply | Qty: 30 | Fill #1

## 2017-04-30 MED FILL — lamoTRIgine 200 MG TABS: 200 | 30 days supply | Qty: 60 | Fill #1

## 2017-05-01 ENCOUNTER — Encounter: Payer: Self-pay | Admitting: Family Medicine

## 2017-05-01 NOTE — Progress Notes (Signed)
Negative for intraepithelial lesion or malignancy.  

## 2017-05-17 MED FILL — BUPROPION HCL XL 300 MG TAB: 300 | 30 days supply | Qty: 30 | Fill #1

## 2017-05-18 MED FILL — UNIFINE PENTIPS 31GX3/16: 31G X 5 MM | 90 days supply | Qty: 100 | Fill #1

## 2017-05-18 MED FILL — UNIFINE PENTIPS 31GX3/16": 31G X 5 MM | 90 days supply | Qty: 100 | Fill #1

## 2017-05-31 MED FILL — METFORMIN HCL ER 500 MG TAB: 500 | 60 days supply | Qty: 120 | Fill #1

## 2017-05-31 MED FILL — lamoTRIgine 200 MG TABS: 200 | 30 days supply | Qty: 60 | Fill #2

## 2017-05-31 MED FILL — risperiDONE 1 MG TABS: 1 | 30 days supply | Qty: 30 | Fill #2

## 2017-06-12 DIAGNOSIS — F319 Bipolar disorder, unspecified: Secondary | ICD-10-CM | POA: Diagnosis not present

## 2017-06-13 MED FILL — PROPRANOLOL 20 MG TABLET: 20 | 23 days supply | Qty: 90 | Fill #0

## 2017-06-19 MED FILL — BUPROPION HCL XL 300 MG TAB: 300 | 30 days supply | Qty: 30 | Fill #2

## 2017-07-03 MED FILL — risperiDONE 1 MG TABS: 1 | 30 days supply | Qty: 30 | Fill #0

## 2017-07-03 MED FILL — lamoTRIgine 200 MG TABS: 200 | 30 days supply | Qty: 60 | Fill #0

## 2017-07-09 DIAGNOSIS — F319 Bipolar disorder, unspecified: Secondary | ICD-10-CM | POA: Diagnosis not present

## 2017-07-17 MED FILL — BUPROPION HCL XL 300 MG TAB: 300 | 30 days supply | Qty: 30 | Fill #0

## 2017-07-31 MED FILL — VRAYLAR 1.5 MG CAPSULE: 1.5 | 30 days supply | Qty: 30 | Fill #0

## 2017-08-03 MED FILL — lamoTRIgine 200 MG TABS: 200 | 30 days supply | Qty: 60 | Fill #1

## 2017-08-13 DIAGNOSIS — F319 Bipolar disorder, unspecified: Secondary | ICD-10-CM | POA: Diagnosis not present

## 2017-08-13 MED FILL — PROPRANOLOL 20 MG TABLET: 20 | 23 days supply | Qty: 90 | Fill #1

## 2017-08-13 MED FILL — BUPROPION HCL XL 300 MG TAB: 300 | 30 days supply | Qty: 30 | Fill #1

## 2017-08-13 MED FILL — METFORMIN HCL ER 500 MG TAB: 500 | 60 days supply | Qty: 120 | Fill #2

## 2017-09-05 MED FILL — lamoTRIgine 200 MG TABS: 200 | 30 days supply | Qty: 60 | Fill #2

## 2017-09-26 MED FILL — BuPROPion HCL ER (XL) 300 M: 300 | 30 days supply | Qty: 30 | Fill #0

## 2017-09-26 MED FILL — VRAYLAR 1.5 MG CAPSULE: 1.5 | 30 days supply | Qty: 30 | Fill #0

## 2017-10-11 MED FILL — PROPRANOLOL 20 MG TABLET: 20 | 23 days supply | Qty: 90 | Fill #2

## 2017-10-16 MED FILL — METFORMIN HCL ER 500 MG TAB: 500 | 60 days supply | Qty: 120 | Fill #3

## 2017-10-16 MED FILL — lamoTRIgine 200 MG TABS: 200 | 30 days supply | Qty: 60 | Fill #3

## 2017-11-01 MED FILL — BuPROPion HCL ER (XL) 300 M: 300 | 30 days supply | Qty: 30 | Fill #1

## 2017-11-19 DIAGNOSIS — F319 Bipolar disorder, unspecified: Secondary | ICD-10-CM | POA: Diagnosis not present

## 2017-11-20 MED FILL — lamoTRIgine 200 MG TABS: 200 | 30 days supply | Qty: 60 | Fill #0

## 2017-11-20 MED FILL — VRAYLAR 1.5 MG CAPSULE: 1.5 | 30 days supply | Qty: 30 | Fill #0

## 2017-12-06 MED FILL — buPROPion HCL ER (XL) 300 M: 300 | 30 days supply | Qty: 30 | Fill #2

## 2017-12-14 MED FILL — PROPRANOLOL 20 MG TABLET: 20 | 23 days supply | Qty: 90 | Fill #3

## 2017-12-24 MED FILL — VRAYLAR 1.5 MG CAPSULE: 1.5 | 30 days supply | Qty: 30 | Fill #1

## 2017-12-24 MED FILL — lamoTRIgine 200 MG TABS: 200 | 30 days supply | Qty: 60 | Fill #1

## 2017-12-24 MED FILL — metFORMIN HCL ER 500 MG TB2: 500 | 60 days supply | Qty: 120 | Fill #0

## 2018-01-01 ENCOUNTER — Ambulatory Visit (INDEPENDENT_AMBULATORY_CARE_PROVIDER_SITE_OTHER): Payer: 59

## 2018-01-01 ENCOUNTER — Ambulatory Visit (INDEPENDENT_AMBULATORY_CARE_PROVIDER_SITE_OTHER): Payer: 59 | Admitting: Osteopathic Medicine

## 2018-01-01 ENCOUNTER — Encounter: Payer: Self-pay | Admitting: Osteopathic Medicine

## 2018-01-01 VITALS — BP 125/84 | HR 86 | Temp 97.9°F | Wt 229.9 lb

## 2018-01-01 DIAGNOSIS — F3160 Bipolar disorder, current episode mixed, unspecified: Secondary | ICD-10-CM

## 2018-01-01 DIAGNOSIS — F17200 Nicotine dependence, unspecified, uncomplicated: Secondary | ICD-10-CM | POA: Diagnosis not present

## 2018-01-01 DIAGNOSIS — R0602 Shortness of breath: Secondary | ICD-10-CM

## 2018-01-01 NOTE — Patient Instructions (Signed)
Plan:  Will get labs today.  Will get chest Xray today.  Will schedule you back in office with me or Dr Madilyn Fireman for lung function testing to evaluate for COPD.   Continue to cut back on smoking!

## 2018-01-01 NOTE — Progress Notes (Signed)
HPI: Charlotte Hobbs is a 47 y.o. female who  has a past medical history of Depression and Mania ([place]).  she presents to [facility] today, 01/01/18,  for chief complaint of: Breathing trouble  Noting her breath is catching more on exertion lately, no real "shortness" of breath or gasping/wheezing. She thinks might be the propranolol but she's been on this for years w/o change. No chest pain. Was exercising vigorously about a month ago with SOb she attributes to being out of shape.   Lost to follow-up for some time, has been seeing psychiatry for treatment of bipolar: propranolol, vraylar, wellbutrin, lamictal, metformin.   Smoker 1/2 ppd, 30+ years, at one time was up to 1.5 ppd.      Past medical, surgical, social and family history reviewed:  Patient Active Problem List   Diagnosis Date Noted  . Lumbar herniated disc 11/13/2011  . Lumbar nerve root impingement 11/01/2011  . Bipolar 1 disorder, mixed ([place]) 06/19/2011  . BACK PAIN, THORACIC REGION, RIGHT 12/21/2009  . PLANTAR FASCIITIS 10/25/2009  . BIPOLAR I, MIXED, MOST RECENT EPSD NOS 03/15/2006    Past Surgical History:  Procedure Laterality Date  . CESAREAN SECTION  2002  . LUMBAR LAMINECTOMY/DECOMPRESSION MICRODISCECTOMY  11/13/2011   Procedure: LUMBAR LAMINECTOMY/DECOMPRESSION MICRODISCECTOMY 2 LEVELS;  Surgeon: Ophelia Charter, MD;  Location: [place] [facility];  Service: Neurosurgery;  Laterality: Right;  RIGHT Lumbar four-five Lumbar five sacral one diskectomy  . MYOMECTOMY  1996    Social History   Tobacco Use  . Smoking status: Current Some Day Smoker    Packs/day: 0.50    Years: 25.00    Pack years: 12.50    Types: Cigarettes  Substance Use Topics  . Alcohol use: Not on file    Family History  Problem Relation Age of Onset  . Hypertension Father   . Heart attack Father 59  . Anxiety disorder Father   . Heart attack Paternal Grandfather 40     Current medication list  and allergy/intolerance information reviewed:    Current Outpatient Medications  Medication Sig Dispense Refill  . Apremilast (OTEZLA) 10 & 20 & 30 MG TBPK Take as directed. 1 each 0  . buPROPion (WELLBUTRIN XL) 300 MG 24 hr tablet Take 1 tablet (300 mg total) by mouth daily. 90 tablet 0  . calcipotriene (DOVONOX) 0.005 % cream Apply topically 2 (two) times daily. 60 g 5  . Cariprazine HCl (VRAYLAR) 1.5 & 3 MG CPPK Take by mouth.    . Clobetasol Prop Emollient Base (CLOBETASOL PROPIONATE E) 0.05 % emollient cream Apply 1 application topically daily as needed. 30 g 1  . lamoTRIgine (LAMICTAL) 150 MG tablet TAKE 1 TABLET BY MOUTH 2 TIMES DAILY. 180 tablet 0  . propranolol (INDERAL) 20 MG tablet Take 20 mg by mouth 3 (three) times daily.    . risperiDONE (RISPERDAL) 0.5 MG tablet Take 1 tablet (0.5 mg total) by mouth at bedtime. (Patient not taking: Reported on 01/01/2018) 90 tablet 0   No current facility-administered medications for this visit.     No Known Allergies    Review of Systems:  Constitutional:  No  fever, no chills, No recent illness, No unintentional weight changes. No significant fatigue.   HEENT: No  headache, no vision change, no hearing change, No sore throat, No  sinus pressure  Cardiac: No  chest pain, No  pressure, No palpitations, No  Orthopnea  Respiratory:  +shortness of breath. No  Cough  Gastrointestinal: No  abdominal pain, No  nausea, No  vomiting,  No  blood in stool, No  diarrhea, No  constipation   Musculoskeletal: No new myalgia/arthralgia  Skin: No  Rash,  Hem/Onc: No  easy bruising/bleeding  Neurologic: No  weakness, No  dizziness, No  slurred speech/focal weakness/facial droop  Psychiatric: No  concerns with depression, No  concerns with anxiety  Exam:  BP 125/84 (BP Location: Left Arm, Patient Position: Sitting, Cuff Size: Large)   Pulse 86   Temp 97.9 F (36.6 C) (Oral)   Wt 229 lb 14.4 oz (104.3 kg)   SpO2 99%   BMI 39.16 kg/m    Constitutional: VS see above. General Appearance: alert, well-developed, well-nourished, NAD  Eyes: Normal lids and conjunctive, non-icteric sclera  Ears, Nose, Mouth, Throat: MMM, Normal external inspection ears/nares/mouth/lips/gums. Pharynx/tonsils no erythema, no exudate. Nasal mucosa normal.   Neck: No masses, trachea midline. No thyroid enlargement. No tenderness/mass appreciated. No lymphadenopathy  Respiratory: Normal respiratory effort. no wheeze, no rhonchi, no rales  Cardiovascular: S1/S2 normal, no murmur, no rub/gallop auscultated. RRR. No lower extremity edema.   Gastrointestinal: Nontender, no masses. No hepatomegaly, no splenomegaly. No hernia appreciated. Bowel sounds normal. Rectal exam deferred.   Musculoskeletal: Gait normal. No clubbing/cyanosis of digits.   Neurological: Normal balance/coordination. No tremor. No cranial nerve deficit on limited exam.   Skin: warm, dry, intact. No rash/ulcer.  Psychiatric: Normal judgment/insight. Normal mood and affect. Oriented x3.         ASSESSMENT/PLAN: The primary encounter diagnosis was Short of breath on exertion. Diagnoses of Tobacco dependence and Bipolar 1 disorder, mixed ([place]) were also pertinent to this visit.   Orders Placed This Encounter  Procedures  . DG Chest 2 View  . CBC  . COMPLETE METABOLIC PANEL WITH GFR  . TSH  . T4, free  . Hemoglobin A1c     Patient Instructions  Plan:  Will get labs today.  Will get chest Xray today.  Will schedule you back in office with me or Dr Madilyn Fireman for lung function testing to evaluate for COPD.   Continue to cut back on smoking!        Visit summary with medication list and pertinent instructions was printed for patient to review. All questions at time of visit were answered - patient instructed to contact office with any additional concerns or updates. ER/RTC precautions were reviewed with the patient.     Please note: voice recognition  software was used to produce this document, and typos may escape review. Please contact Dr. Sheppard Coil for any needed clarifications.     Follow-up plan: Return for lung function testing (Dr Sheppard Coil or Dr Madilyn Fireman) .

## 2018-01-02 LAB — TSH: TSH: 0.9 mIU/L

## 2018-01-02 LAB — CBC
HCT: 41.4 % (ref 35.0–45.0)
Hemoglobin: 14 g/dL (ref 11.7–15.5)
MCH: 32.3 pg (ref 27.0–33.0)
MCHC: 33.8 g/dL (ref 32.0–36.0)
MCV: 95.6 fL (ref 80.0–100.0)
MPV: 10.4 fL (ref 7.5–12.5)
Platelets: 264 10*3/uL (ref 140–400)
RBC: 4.33 10*6/uL (ref 3.80–5.10)
RDW: 12.5 % (ref 11.0–15.0)
WBC: 7.2 10*3/uL (ref 3.8–10.8)

## 2018-01-02 LAB — COMPLETE METABOLIC PANEL WITH GFR
AG Ratio: 1.7 (calc) (ref 1.0–2.5)
ALT: 19 U/L (ref 6–29)
AST: 19 U/L (ref 10–35)
Albumin: 4.1 g/dL (ref 3.6–5.1)
Alkaline phosphatase (APISO): 69 U/L (ref 33–115)
BUN: 12 mg/dL (ref 7–25)
CO2: 28 mmol/L (ref 20–32)
Calcium: 9.7 mg/dL (ref 8.6–10.2)
Chloride: 104 mmol/L (ref 98–110)
Creat: 0.81 mg/dL (ref 0.50–1.10)
GFR, EST AFRICAN AMERICAN: 100 mL/min/{1.73_m2} (ref >=60)
GFR, Est Non African American: 86 mL/min/{1.73_m2} (ref >=60)
Globulin: 2.4 g/dL (calc) (ref 1.9–3.7)
Glucose, Bld: 85 mg/dL (ref 65–99)
Potassium: 4.5 mmol/L (ref 3.5–5.3)
Sodium: 139 mmol/L (ref 135–146)
TOTAL PROTEIN: 6.5 g/dL (ref 6.1–8.1)
Total Bilirubin: 0.4 mg/dL (ref 0.2–1.2)

## 2018-01-02 LAB — HEMOGLOBIN A1C
Hgb A1c MFr Bld: 5.1 % of total Hgb (ref <5.7)
Mean Plasma Glucose: 100 (calc)
eAG (mmol/L): 5.5 (calc)

## 2018-01-02 LAB — T4, FREE: Free T4: 1.2 ng/dL (ref 0.8–1.8)

## 2018-01-08 MED FILL — buPROPion HCL ER (XL) 300 M: 300 | 30 days supply | Qty: 30 | Fill #3

## 2018-01-21 ENCOUNTER — Ambulatory Visit (INDEPENDENT_AMBULATORY_CARE_PROVIDER_SITE_OTHER): Payer: 59 | Admitting: Family Medicine

## 2018-01-21 ENCOUNTER — Encounter: Payer: Self-pay | Admitting: Family Medicine

## 2018-01-21 VITALS — BP 142/86 | HR 108 | Temp 97.6°F | Wt 234.0 lb

## 2018-01-21 DIAGNOSIS — E669 Obesity, unspecified: Secondary | ICD-10-CM

## 2018-01-21 DIAGNOSIS — J984 Other disorders of lung: Secondary | ICD-10-CM | POA: Diagnosis not present

## 2018-01-21 DIAGNOSIS — Z720 Tobacco use: Secondary | ICD-10-CM | POA: Diagnosis not present

## 2018-01-21 DIAGNOSIS — R0602 Shortness of breath: Secondary | ICD-10-CM

## 2018-01-21 LAB — PULMONARY FUNCTION TEST

## 2018-01-21 NOTE — Progress Notes (Signed)
Established Patient Office Visit  Subjective:  Patient ID: Charlotte Hobbs, female    DOB: [birth year]  Age: 48 y.o. MRN: [id number]  CC: No chief complaint on file.   HPI Charlotte Hobbs presents for [place] of breath.  She actually saw 1 of my partners about 2 weeks ago for it feeling like she could not quite catch her breath especially with exertion.  She denied any gasping or wheezing.  She is on propranolol and started that for anxiety control about 6 months ago.  She had a chest x-ray which was essentially normal.  Labs were normal as well ruling out thyroid disorder or anemia.  She is been working on smoking cessation and is currently using a nicotine patch.  Denies any cough or recent respiratory colds or infections.  He denies any prior history of lung problems.  She said she was exercising regularly a couple of months ago until suddenly she just felt like she could not exercise consistently because of her breathing.  Denies any chest pain.  She denies any known exposures to asbestos, fibers, working in a IT consultant.  She denies any known history of significant lung infections in her past.  No prior history of asthma.  She does smoke but has been working on quitting she is currently using a nicotine patch.  Past Medical History:  Diagnosis Date  . Depression    bi polar  . Mania [facility])     Past Surgical History:  Procedure Laterality Date  . CESAREAN SECTION  2002  . LUMBAR LAMINECTOMY/DECOMPRESSION MICRODISCECTOMY  11/13/2011   Procedure: LUMBAR LAMINECTOMY/DECOMPRESSION MICRODISCECTOMY 2 LEVELS;  Surgeon: Ophelia Charter, MD;  Location: [place] [facility];  Service: Neurosurgery;  Laterality: Right;  RIGHT Lumbar four-five Lumbar five sacral one diskectomy  . MYOMECTOMY  1996    Family History  Problem Relation Age of Onset  . Hypertension Father   . Heart attack Father 48  . Anxiety disorder Father   . Heart attack Paternal Grandfather 61    Social History   Socioeconomic  History  . Marital status: Divorced    Spouse name: Not on file  . Number of children: 1  . Years of education: Not on file  . Highest education level: Not on file  Occupational History  . Occupation: Research scientist (life sciences): [employer]  Social Needs  . Financial resource strain: Not on file  . Food insecurity:    Worry: Not on file    Inability: Not on file  . Transportation needs:    Medical: Not on file    Non-medical: Not on file  Tobacco Use  . Smoking status: Current Some Day Smoker    Packs/day: 0.50    Years: 25.00    Pack years: 12.50    Types: Cigarettes  . Smokeless tobacco: Never Used  Substance and Sexual Activity  . Alcohol use: Not on file  . Drug use: No  . Sexual activity: Not Currently    Birth control/protection: Condom  Lifestyle  . Physical activity:    Days per week: Not on file    Minutes per session: Not on file  . Stress: Not on file  Relationships  . Social connections:    Talks on phone: Not on file    Gets together: Not on file    Attends religious service: Not on file    Active member of club or organization: Not on file    Attends meetings  of clubs or organizations: Not on file    Relationship status: Not on file  . Intimate partner violence:    Fear of current or ex partner: Not on file    Emotionally abused: Not on file    Physically abused: Not on file    Forced sexual activity: Not on file  Other Topics Concern  . Not on file  Social History Narrative   No regular exercise.  Divorced.     Outpatient Medications Prior to Visit  Medication Sig Dispense Refill  . Apremilast (OTEZLA) 10 & 20 & 30 MG TBPK Take as directed. 1 each 0  . buPROPion (WELLBUTRIN XL) 300 MG 24 hr tablet Take 1 tablet (300 mg total) by mouth daily. 90 tablet 0  . calcipotriene (DOVONOX) 0.005 % cream Apply topically 2 (two) times daily. 60 g 5  . Cariprazine HCl (VRAYLAR) 1.5 & 3 MG CPPK Take by mouth.    . Clobetasol Prop Emollient Base  (CLOBETASOL PROPIONATE E) 0.05 % emollient cream Apply 1 application topically daily as needed. 30 g 1  . lamoTRIgine (LAMICTAL) 150 MG tablet TAKE 1 TABLET BY MOUTH 2 TIMES DAILY. 180 tablet 0  . propranolol (INDERAL) 20 MG tablet Take 20 mg by mouth 3 (three) times daily.    . risperiDONE (RISPERDAL) 0.5 MG tablet Take 1 tablet (0.5 mg total) by mouth at bedtime. 90 tablet 0   No facility-administered medications prior to visit.     No Known Allergies  ROS Review of Systems    Objective:    Physical Exam  Constitutional: She is oriented to person, place, and time. She appears well-developed and well-nourished.  HENT:  Head: Normocephalic and atraumatic.  Cardiovascular: Normal rate, regular rhythm and normal heart sounds.  Pulmonary/Chest: Effort normal and breath sounds normal.  Neurological: She is alert and oriented to person, place, and time.  Skin: Skin is warm and dry.  Psychiatric: She has a normal mood and affect. Her behavior is normal.    BP (!) 142/86   Pulse (!) 108   Temp 97.6 F (36.4 C) (Oral)   Wt 234 lb (106.1 kg)   BMI 39.85 kg/m  Wt Readings from Last 3 Encounters:  01/21/18 234 lb (106.1 kg)  01/01/18 229 lb 14.4 oz (104.3 kg)  10/05/14 225 lb (102.1 kg)     Health Maintenance Due  Topic Date Due  . HIV Screening  08/12/1985    There are no preventive care reminders to display for this patient.  Lab Results  Component Value Date   TSH 0.90 01/01/2018   Lab Results  Component Value Date   WBC 7.2 01/01/2018   HGB 14.0 01/01/2018   HCT 41.4 01/01/2018   MCV 95.6 01/01/2018   PLT 264 01/01/2018   Lab Results  Component Value Date   NA 139 01/01/2018   K 4.5 01/01/2018   CO2 28 01/01/2018   GLUCOSE 85 01/01/2018   BUN 12 01/01/2018   CREATININE 0.81 01/01/2018   BILITOT 0.4 01/01/2018   ALKPHOS 68 11/17/2013   AST 19 01/01/2018   ALT 19 01/01/2018   PROT 6.5 01/01/2018   ALBUMIN 4.2 11/17/2013   CALCIUM 9.7 01/01/2018   Lab  Results  Component Value Date   CHOL 174 11/17/2013   Lab Results  Component Value Date   HDL 46 11/17/2013   Lab Results  Component Value Date   LDLCALC 107 (H) 11/17/2013   Lab Results  Component Value Date  TRIG 107 11/17/2013   Lab Results  Component Value Date   CHOLHDL 3.8 11/17/2013   Lab Results  Component Value Date   HGBA1C 5.1 01/01/2018      Assessment & Plan:   Problem List Items Addressed This Visit      Respiratory   Restrictive lung disease secondary to obesity - Primary    Spirometry January 2020 shows FVC of 72%, FEV1 of 73% with a normal ratio of 81.  No significant improvement post albuterol.  Most consistent with restrictive lung disease secondary to obesity based on her history of no known lung exposures, respiratory infections, with normal chest x-ray.  Gust the importance of weight loss to help improve her shortness of breath and lung function.  She has a significant amount of abdominal obesity that I do think is contributing.  Will consider the propranolol could be contributing she has been on this for the last 6 months and she may want to speak with her psychiatrist about this.  Lung exam is clear today.       Other Visit Diagnoses    Tobacco abuse       SOB (shortness of breath)         Reviewed results of the spirometry with her and her diagnosis.  See recommendation above for weight loss.  If she ends up having the gastric bypass this year then we could consider repeating her spirometry in a year to see if it has improved.  Tobacco abuse.  She is doing well using a nicotine patch but admits she still smoking here there her goal is to ultimately quit as she is actually hoping to have gastric bypass surgery.  She has been following with Dr. Excell Seltzer.  We will send a copy of my note to him.  No orders of the defined types were placed in this encounter.   Follow-up: No follow-ups on file.    Beatrice Lecher, MD

## 2018-01-21 NOTE — Assessment & Plan Note (Addendum)
Spirometry January 2020 shows FVC of 72%, FEV1 of 73% with a normal ratio of 81.  No significant improvement post albuterol.  Most consistent with restrictive lung disease secondary to obesity based on her history of no known lung exposures, respiratory infections, with normal chest x-ray.  Gust the importance of weight loss to help improve her shortness of breath and lung function.  She has a significant amount of abdominal obesity that I do think is contributing.  Will consider the propranolol could be contributing she has been on this for the last 6 months and she may want to speak with her psychiatrist about this.  Lung exam is clear today.

## 2018-01-22 ENCOUNTER — Telehealth: Payer: Self-pay

## 2018-01-22 NOTE — Telephone Encounter (Signed)
Atkins Diet (2010 and 2013), weight watchers (1995 and 2003 and 2017-2018), calorie restriction (1990-current off and on), exercise with trainer (2008-2009), phentermine (2010).   Currently she is doing weight watchers and moderate exercise.

## 2018-01-22 NOTE — Telephone Encounter (Signed)
Pt brought in form to be completed by Dr Madilyn Fireman for surgical weight loss. Pt needs form completed and a letter of medical necessity completed.   As part of the form requires "weight loss attempts (include dates, results, medications diet and exercise modifications)", I have attempted to call patient for this information. Pt did not answer phone and no VM set up.   I have researched pt's chart and did not see any medication attempts to lose weight.   Awaiting call back from pt to find out if any diets or exercise routines were tried, along with dates and results

## 2018-01-23 NOTE — Telephone Encounter (Signed)
Information documented on form and placed in Dr Gardiner Ramus box along with note stating that pt did not have any information to provide as far as success with any of the above attempts

## 2018-01-28 DIAGNOSIS — M545 Low back pain: Secondary | ICD-10-CM | POA: Diagnosis not present

## 2018-01-28 DIAGNOSIS — G8929 Other chronic pain: Secondary | ICD-10-CM | POA: Diagnosis not present

## 2018-01-28 DIAGNOSIS — N393 Stress incontinence (female) (male): Secondary | ICD-10-CM | POA: Diagnosis not present

## 2018-01-28 MED FILL — lamoTRIgine 200 MG TABS: 200 | 30 days supply | Qty: 60 | Fill #2

## 2018-01-28 MED FILL — VRAYLAR 1.5 MG CAPSULE: 1.5 | 30 days supply | Qty: 30 | Fill #2

## 2018-01-30 ENCOUNTER — Telehealth: Payer: Self-pay | Admitting: *Deleted

## 2018-01-30 NOTE — Telephone Encounter (Signed)
Letter completed, faxed and confirmation received, scanned into pt's chart.Maryruth Eve, Lahoma Crocker, CMA

## 2018-02-11 MED FILL — PROPRANOLOL 20 MG TABLET: 20 | 23 days supply | Qty: 90 | Fill #0

## 2018-02-18 ENCOUNTER — Encounter: Payer: Self-pay | Admitting: Skilled Nursing Facility1

## 2018-02-18 ENCOUNTER — Encounter: Payer: 59 | Attending: General Surgery | Admitting: Skilled Nursing Facility1

## 2018-02-18 DIAGNOSIS — E669 Obesity, unspecified: Secondary | ICD-10-CM | POA: Diagnosis not present

## 2018-02-18 NOTE — Progress Notes (Signed)
Pre-Op Assessment Visit:  Pre-Operative sleeve Gastrectomy Surgery  Medical Nutrition Therapy:  Appt start time: 9:55  End time:  10:55  Patient was seen on 02/18/2018 for Pre-Operative Nutrition Assessment. Assessment and letter of approval faxed to [facility] Surgery Bariatric Surgery Program coordinator on 02/18/2018  Pt state she has a gnawing hunger constantly which she attributes to her mental health medication.  Pt states she will work on preparing herself mentally for changes to come.   Pt expectation of surgery: to lose weight   Pt expectation of Dietitian: none stated  Start weight at [facility]: 240.3 BMI: 40.99  24 hr Dietary Recall: First Meal: bagel or oatmeal  Snack:  Second Meal: sandwich Snack: sweets  Third Meal: salad or hamburger  Snack: sweets Beverages: soda, water, sweet tea  Encouraged to engage in 150 minutes of moderate physical activity including cardiovascular and weight baring weekly  Handouts given during visit include:  . Pre-Op Goals . Bariatric Surgery Protein Shakes During the appointment today the following Pre-Op Goals were reviewed with the patient: . Maintain or lose weight as instructed by your surgeon . Make healthy food choices . Begin to limit portion sizes . Limited concentrated sugars and fried foods . Keep fat/sugar in the single digits per serving on             food labels . Practice CHEWING your food  (aim for 30 chews per bite or until applesauce consistency) . Practice not drinking 15 minutes before, during, and 30 minutes after each meal/snack . Avoid all carbonated beverages  . Avoid/limit caffeinated beverages  . Avoid all sugar-sweetened beverages . Consume 3 meals per day; eat every 3-5 hours . Make a list of non-food related activities . Aim for 64-100 ounces of FLUID daily  . Aim for at least 60-80 grams of PROTEIN daily . Look for a liquid protein source that contain ?15 g protein and ?5 g carbohydrate  (ex:  shakes, drinks, shots)  -Follow diet recommendations listed below   Energy and Macronutrient Recomendations: Calories: 1500 Carbohydrate: 170 Protein: 112 Fat: 42  Demonstrated degree of understanding via:  Teach Back  Teaching Method Utilized:  Visual Auditory Hands on  Barriers to learning/adherence to lifestyle change: none identified   Patient to call the Nutrition and [facility] Education Services to enroll in Pre-Op and Post-Op Nutrition Education when surgery date is scheduled.

## 2018-02-25 DIAGNOSIS — F319 Bipolar disorder, unspecified: Secondary | ICD-10-CM | POA: Diagnosis not present

## 2018-03-08 MED FILL — VRAYLAR 1.5 MG CAPSULE: 1.5 | 30 days supply | Qty: 30 | Fill #3

## 2018-03-08 MED FILL — metFORMIN HCL ER 500 MG TB2: 500 | 60 days supply | Qty: 120 | Fill #1

## 2018-03-08 MED FILL — lamoTRIgine 200 MG TABS: 200 | 30 days supply | Qty: 60 | Fill #3

## 2018-04-09 ENCOUNTER — Ambulatory Visit: Payer: 59 | Admitting: Psychiatry

## 2018-04-10 MED FILL — VRAYLAR 1.5 MG CAPSULE: 1.5 | 30 days supply | Qty: 30 | Fill #0

## 2018-04-10 MED FILL — PROPRANOLOL 20 MG TABLET: 20 | 22 days supply | Qty: 90 | Fill #0

## 2018-04-10 MED FILL — buPROPion HCL ER (XL) 300 M: 300 | 30 days supply | Qty: 30 | Fill #0

## 2018-04-10 MED FILL — SUBVENITE 200 MG TABS: 200 | 30 days supply | Qty: 60 | Fill #0

## 2018-04-15 ENCOUNTER — Ambulatory Visit (INDEPENDENT_AMBULATORY_CARE_PROVIDER_SITE_OTHER): Payer: 59 | Admitting: Psychology

## 2018-04-15 DIAGNOSIS — F509 Eating disorder, unspecified: Secondary | ICD-10-CM

## 2018-04-15 DIAGNOSIS — F319 Bipolar disorder, unspecified: Secondary | ICD-10-CM | POA: Diagnosis not present

## 2018-04-29 DIAGNOSIS — F319 Bipolar disorder, unspecified: Secondary | ICD-10-CM | POA: Diagnosis not present

## 2018-04-29 MED FILL — PROPRANOLOL 20 MG TABLET: 20 | 22 days supply | Qty: 90 | Fill #0

## 2018-04-29 MED FILL — buPROPion HCL ER (XL) 300 M: 300 | 30 days supply | Qty: 30 | Fill #0

## 2018-04-29 MED FILL — SUBVENITE 200 MG TABS: 200 | 30 days supply | Qty: 60 | Fill #0

## 2018-04-29 MED FILL — risperiDONE 2 MG TABS: 2 | 30 days supply | Qty: 30 | Fill #0

## 2018-05-08 ENCOUNTER — Other Ambulatory Visit: Payer: Self-pay | Admitting: General Surgery

## 2018-05-08 ENCOUNTER — Other Ambulatory Visit (HOSPITAL_COMMUNITY): Payer: Self-pay | Admitting: General Surgery

## 2018-05-09 ENCOUNTER — Other Ambulatory Visit: Payer: Self-pay | Admitting: General Surgery

## 2018-05-09 DIAGNOSIS — Z1231 Encounter for screening mammogram for malignant neoplasm of breast: Secondary | ICD-10-CM

## 2018-05-14 MED FILL — metFORMIN HCL ER 500 MG TB2: 500 | 60 days supply | Qty: 120 | Fill #0

## 2018-05-29 DIAGNOSIS — L408 Other psoriasis: Secondary | ICD-10-CM | POA: Diagnosis not present

## 2018-05-29 DIAGNOSIS — Z79899 Other long term (current) drug therapy: Secondary | ICD-10-CM | POA: Diagnosis not present

## 2018-05-29 MED FILL — CLOBETASOL PROPIONATE 0.05: 0.05 | 30 days supply | Qty: 60 | Fill #0

## 2018-06-03 MED FILL — CALCIPOTRIENE 0.005 % CREA: 0.005 | 30 days supply | Qty: 60 | Fill #0

## 2018-06-15 MED FILL — risperiDONE 2 MG TABS: 2 | 30 days supply | Qty: 30 | Fill #0

## 2018-06-15 MED FILL — SUBVENITE 200 MG TABS: 200 | 30 days supply | Qty: 60 | Fill #1

## 2018-06-15 MED FILL — buPROPion HCL ER (XL) 300 M: 300 | 30 days supply | Qty: 30 | Fill #1

## 2018-06-17 ENCOUNTER — Ambulatory Visit: Payer: 59 | Admitting: Psychology

## 2018-06-22 MED FILL — VRAYLAR 1.5 MG CAPSULE: 1.5 | 30 days supply | Qty: 30 | Fill #1

## 2018-06-24 ENCOUNTER — Other Ambulatory Visit (HOSPITAL_COMMUNITY): Payer: Self-pay | Admitting: General Surgery

## 2018-06-24 ENCOUNTER — Ambulatory Visit (HOSPITAL_COMMUNITY): Payer: 59

## 2018-06-24 ENCOUNTER — Other Ambulatory Visit: Payer: Self-pay

## 2018-06-24 ENCOUNTER — Ambulatory Visit (HOSPITAL_COMMUNITY): Payer: Self-pay

## 2018-06-24 ENCOUNTER — Ambulatory Visit (HOSPITAL_COMMUNITY)
Admission: RE | Admit: 2018-06-24 | Discharge: 2018-06-24 | Disposition: A | Discharge status: Home / Self Care | Payer: 59 | Source: Ambulatory Visit | Attending: General Surgery | Admitting: General Surgery

## 2018-06-24 DIAGNOSIS — Z01818 Encounter for other preprocedural examination: Secondary | ICD-10-CM | POA: Diagnosis not present

## 2018-07-01 ENCOUNTER — Ambulatory Visit (INDEPENDENT_AMBULATORY_CARE_PROVIDER_SITE_OTHER): Payer: 59 | Admitting: Family Medicine

## 2018-07-01 ENCOUNTER — Encounter: Payer: Self-pay | Admitting: Family Medicine

## 2018-07-01 VITALS — BP 136/90 | HR 105 | Ht 64.0 in | Wt 257.0 lb

## 2018-07-01 DIAGNOSIS — R030 Elevated blood-pressure reading, without diagnosis of hypertension: Secondary | ICD-10-CM | POA: Diagnosis not present

## 2018-07-01 DIAGNOSIS — R0602 Shortness of breath: Secondary | ICD-10-CM

## 2018-07-01 DIAGNOSIS — Z8249 Family history of ischemic heart disease and other diseases of the circulatory system: Secondary | ICD-10-CM | POA: Diagnosis not present

## 2018-07-01 NOTE — Progress Notes (Signed)
Established Patient Office Visit  Subjective:  Patient ID: Charlotte Hobbs, female    DOB: [birth year]  Age: 48 y.o. MRN: [id number]  CC:  Chief Complaint  Patient presents with  . Shortness of Breath    HPI Charlotte Hobbs presents for SOB.  She was recently undergoing a preoperative evaluation for gastric sleeve with the bariatric surgeon.  She had an EKG which showed a rate of 70 bpm with possible anterior infarct.  She also had some poor R wave progression in the lateral leads.  She denies any recent chest pain but says she does have shortness of breath with exertion which she says has been going on for the last several months.  She says sometimes she will have body aches though she actually feels a little bit better this week she reports.  No recent fevers chills or sweats.  She denies any palpitations.  She says she more recently she is actually decreased her caffeine intake.  She denies any recent medication changes.  She did have a chest x-ray in January which was normal.  She also has a very strong family history of heart disease with her maternal grandmother having heart disease in her 84s, paternal grandfather died in his 29s from heart disease and her father died in his 23s from coronary artery disease.  She is morbidly obese with a BMI of 44 Last set of lipids were in 2015 and LDL was really only borderline.  He has had some borderline elevated blood pressures recently.   Past Medical History:  Diagnosis Date  . Depression    bi polar  . [facility])     Past Surgical History:  Procedure Laterality Date  . CESAREAN SECTION  2002  . LUMBAR LAMINECTOMY/DECOMPRESSION MICRODISCECTOMY  11/13/2011   Procedure: LUMBAR LAMINECTOMY/DECOMPRESSION MICRODISCECTOMY 2 LEVELS;  Surgeon: Ophelia Charter, MD;  Location: [facility];  Service: Neurosurgery;  Laterality: Right;  RIGHT Lumbar four-five Lumbar five sacral one diskectomy  . MYOMECTOMY  1996    Family History  Problem  Relation Age of Onset  . Hypertension Father   . Heart attack Father 66  . Anxiety disorder Father   . Heart attack Paternal Grandfather 34  . Heart disease Maternal Grandmother     Social History   Socioeconomic History  . Marital status: Divorced    Spouse name: Not on file  . Number of children: 1  . Years of education: Not on file  . Highest education level: Not on file  Occupational History  . Occupation: Research scientist (life sciences): [employer]  Social Needs  . Financial resource strain: Not on file  . Food insecurity    Worry: Not on file    Inability: Not on file  . Transportation needs    Medical: Not on file    Non-medical: Not on file  Tobacco Use  . Smoking status: Current Some Day Smoker    Packs/day: 0.50    Years: 25.00    Pack years: 12.50    Types: Cigarettes  . Smokeless tobacco: Never Used  Substance and Sexual Activity  . Alcohol use: Not on file  . Drug use: No  . Sexual activity: Not Currently    Birth control/protection: Condom  Lifestyle  . Physical activity    Days per week: Not on file    Minutes per session: Not on file  . Stress: Not on file  Relationships  . Social connections  Talks on phone: Not on file    Gets together: Not on file    Attends religious service: Not on file    Active member of club or organization: Not on file    Attends meetings of clubs or organizations: Not on file    Relationship status: Not on file  . Intimate partner violence    Fear of current or ex partner: Not on file    Emotionally abused: Not on file    Physically abused: Not on file    Forced sexual activity: Not on file  Other Topics Concern  . Not on file  Social History Narrative   No regular exercise.  Divorced.     Outpatient Medications Prior to Visit  Medication Sig Dispense Refill  . Apremilast (OTEZLA) 10 & 20 & 30 MG TBPK Take as directed. 1 each 0  . Cariprazine HCl (VRAYLAR) 1.5 & 3 MG CPPK Take by mouth.    .  propranolol (INDERAL) 20 MG tablet Take 20 mg by mouth 3 (three) times daily.    [name] buPROPion (WELLBUTRIN XL) 300 MG 24 hr tablet Take 1 tablet (300 mg total) by mouth daily. 90 tablet 0  . calcipotriene (DOVONOX) 0.005 % cream Apply topically 2 (two) times daily. 60 g 5  . Clobetasol Prop Emollient Base (CLOBETASOL PROPIONATE E) 0.05 % emollient cream Apply 1 application topically daily as needed. 30 g 1  . lamoTRIgine (LAMICTAL) 150 MG tablet TAKE 1 TABLET BY MOUTH 2 TIMES DAILY. 180 tablet 0  . risperiDONE (RISPERDAL) 0.5 MG tablet Take 1 tablet (0.5 mg total) by mouth at bedtime. 90 tablet 0   No facility-administered medications prior to visit.     No Known Allergies  ROS Review of Systems    Objective:    Physical Exam  Constitutional: She is oriented to person, place, and time. She appears well-developed and well-nourished.  HENT:  Head: Normocephalic and atraumatic.  Right Ear: External ear normal.  Left Ear: External ear normal.  Nose: Nose normal.  Eyes: Pupils are equal, round, and reactive to light. Conjunctivae and EOM are normal.  Neck: Neck supple. No thyromegaly present.  Cardiovascular: Normal rate, regular rhythm and normal heart sounds.  Pulmonary/Chest: Effort normal and breath sounds normal. She has no wheezes.  Lymphadenopathy:    She has no cervical adenopathy.  Neurological: She is alert and oriented to person, place, and time.  Skin: Skin is warm and dry.  Psychiatric: She has a normal mood and affect. Her behavior is normal.    BP 136/90   Pulse (!) 105   Ht 5\' 4"  (1.626 m)   Wt 257 lb (116.6 kg)   LMP 06/03/2018 (Approximate)   SpO2 99%   BMI 44.11 kg/m  Wt Readings from Last 3 Encounters:  07/01/18 257 lb (116.6 kg)  02/18/18 240 lb 4.8 oz (109 kg)  01/21/18 234 lb (106.1 kg)     Health Maintenance Due  Topic Date Due  . HIV Screening  08/12/1985    There are no preventive care reminders to display for this patient.  Lab Results   Component Value Date   TSH 0.90 01/01/2018   Lab Results  Component Value Date   WBC 7.2 01/01/2018   HGB 14.0 01/01/2018   HCT 41.4 01/01/2018   MCV 95.6 01/01/2018   PLT 264 01/01/2018   Lab Results  Component Value Date   NA 139 01/01/2018   K 4.5 01/01/2018   CO2 28 01/01/2018  GLUCOSE 85 01/01/2018   BUN 12 01/01/2018   CREATININE 0.81 01/01/2018   BILITOT 0.4 01/01/2018   ALKPHOS 68 11/17/2013   AST 19 01/01/2018   ALT 19 01/01/2018   PROT 6.5 01/01/2018   ALBUMIN 4.2 11/17/2013   CALCIUM 9.7 01/01/2018   Lab Results  Component Value Date   CHOL 174 11/17/2013   Lab Results  Component Value Date   HDL 46 11/17/2013   Lab Results  Component Value Date   LDLCALC 107 (H) 11/17/2013   Lab Results  Component Value Date   TRIG 107 11/17/2013   Lab Results  Component Value Date   CHOLHDL 3.8 11/17/2013   Lab Results  Component Value Date   HGBA1C 5.1 01/01/2018      Assessment & Plan:   Problem List Items Addressed This Visit    None    Visit Diagnoses    Short of breath on exertion    -  Primary   Relevant Orders   Ambulatory referral to Cardiology   CBC   COMPLETE METABOLIC PANEL WITH GFR   Lipid panel   TSH   Family history of heart disease       Relevant Orders   Ambulatory referral to Cardiology   CBC   COMPLETE METABOLIC PANEL WITH GFR   Lipid panel   TSH     Shortness of breath-because of some borderline findings on her EKG and her very strong family history of heart disease I think it would be prudent for her to see cardiology before she has surgery and make sure that they do not want to do any additional work-up or maybe even at least a stress test.  We will also get some labs today just to rule out anemia and thyroid disease and also to recheck her lipids since her last panel was 5 years ago.  Family history of premature heart disease-see note above.  We will go ahead and refer for cardiology evaluation before she has  surgery.  Elevated blood pressure without prior diagnosis of hypertension.  Last couple of blood pressures have been very borderline.  If she does end up having the gastric sleeve then her blood pressure will hopefully improve significantly. No orders of the defined types were placed in this encounter.   Follow-up: No follow-ups on file.    Beatrice Lecher, MD

## 2018-07-02 ENCOUNTER — Encounter: Payer: Self-pay | Admitting: Family Medicine

## 2018-07-02 DIAGNOSIS — R030 Elevated blood-pressure reading, without diagnosis of hypertension: Secondary | ICD-10-CM | POA: Insufficient documentation

## 2018-07-02 DIAGNOSIS — I10 Essential (primary) hypertension: Secondary | ICD-10-CM | POA: Insufficient documentation

## 2018-07-05 DIAGNOSIS — R0602 Shortness of breath: Secondary | ICD-10-CM | POA: Insufficient documentation

## 2018-07-05 NOTE — Progress Notes (Signed)
Cardiology Office Note:    Date:  07/08/2018   ID:  KENETRA HILDENBRAND, DOB 1970-02-26, MRN [id number]  PCP:  Hali Marry, MD  Cardiologist:  Shirlee More, MD   Referring MD: Hali Marry, *  ASSESSMENT:    1. Elevated BP without diagnosis of hypertension   2. SOB (shortness of breath) on exertion   3. Restrictive lung disease secondary to obesity    PLAN:    In order of problems listed above:  1. BP is borderline at this time I would not initiate antihypertensive treatment beyond her beta-blocker and should improve with bariatric surgery 2. Shortness of breath differential diagnosis is pulmonary related cigarette smoking restrictive lung disease due to obesity pulmonary artery hypertension and heart failure.  Further evaluation proBNP level echocardiogram and myocardial perfusion study to evaluate anginal equivalent strong family history of CAD. 3. Stable pending bariatric surgery  Next appointment   Medication Adjustments/Labs and Tests Ordered: Current medicines are reviewed at length with the patient today.  Concerns regarding medicines are outlined above.  No orders of the defined types were placed in this encounter.  No orders of the defined types were placed in this encounter.    Chief Complaint  Patient presents with  . Shortness of Breath    with exertion    History of Present Illness:    Charlotte Hobbs is a 48 y.o. female who is being seen today for the evaluation of exertional SOB at the request of Hali Marry, *. She also has a very strong family history of heart disease with her maternal grandmother having heart disease in her 49s, paternal grandfather died in his 64s from heart disease and her father died in his 61s from coronary artery disease  She is pending bariatric surgery and this prompted her evaluation for shortness of breath.  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  She is short of breath at nighttime but has no edema she has had no chest pain palpitation or syncope no cough or wheezing.  She has restrictive lung disease due to obesity her chest x-ray is unremarkable and she has a 30-pack-year smoking history and feels strongly that she will be able to stop and she has had smoking counseling for cessation.  She has no history of congenital rheumatic heart disease.  For further evaluation with a normal EKG and chest x-ray I will check a proBNP level if low and strongly points this away from congestive heart failure echocardiogram is appropriate to look at pulmonary pressures left and right ventricular function.  In view of her strong family history exertional symptoms elective bariatric surgery she will undergo myocardial perfusion study if abnormal may require coronary angiography.  I will see her back in the office in 4 to 6 weeks to pull things together  Past Medical History:  Diagnosis Date  . Depression    bi polar  . [facility])     Past Surgical History:  Procedure Laterality Date  . CESAREAN SECTION  2002  . LUMBAR LAMINECTOMY/DECOMPRESSION MICRODISCECTOMY  11/13/2011   Procedure: LUMBAR LAMINECTOMY/DECOMPRESSION MICRODISCECTOMY 2 LEVELS;  Surgeon: Ophelia Charter, MD;  Location: [place] [facility];  Service: Neurosurgery;  Laterality: Right;  RIGHT Lumbar four-five Lumbar five sacral one diskectomy  .  MYOMECTOMY  1996    Current Medications: Current Meds  Medication Sig  . buPROPion HCl (WELLBUTRIN PO) Take 1 tablet by mouth daily.  . cariprazine (VRAYLAR) capsule Take 1 capsule by mouth daily.   . metFORMIN (GLUCOPHAGE-XR) 500 MG 24 hr tablet Take 500 mg by mouth 2 (two) times a day.  . nicotine (NICOTINE STEP 3) 7 mg/24hr patch Place 7 mg onto the skin daily.  . propranolol (INDERAL) 20 MG tablet  Take 20 mg by mouth 3 (three) times daily.     Allergies:   Patient has no known allergies.   Social History   Socioeconomic History  . Marital status: Divorced    Spouse name: Not on file  . Number of children: 1  . Years of education: Not on file  . Highest education level: Not on file  Occupational History  . Occupation: Research scientist (life sciences): [employer]  Social Needs  . Financial resource strain: Not on file  . Food insecurity    Worry: Not on file    Inability: Not on file  . Transportation needs    Medical: Not on file    Non-medical: Not on file  Tobacco Use  . Smoking status: Former Smoker    Packs/day: 0.50    Years: 25.00    Pack years: 12.50    Types: Cigarettes    Quit date: 03/2018    Years since quitting: 0.3  . Smokeless tobacco: Never Used  Substance and Sexual Activity  . Alcohol use: Not Currently    Alcohol/week: 0.0 standard drinks  . Drug use: No  . Sexual activity: Not Currently    Birth control/protection: Condom  Lifestyle  . Physical activity    Days per week: Not on file    Minutes per session: Not on file  . Stress: Not on file  Relationships  . Social Herbalist on phone: Not on file    Gets together: Not on file    Attends religious service: Not on file    Active member of club or organization: Not on file    Attends meetings of clubs or organizations: Not on file    Relationship status: Not on file  Other Topics Concern  . Not on file  Social History Narrative   No regular exercise.  Divorced.      Family History: The patient's family history includes Anxiety disorder in her father; Heart attack (age of onset: 60) in her father; Heart attack (age of onset: 60) in her paternal grandfather; Heart disease in her maternal grandmother; Hypertension in her father.  ROS:   Review of Systems  Constitution: Positive for weight gain (30 lbs).  HENT: Negative.   Eyes: Negative.   Cardiovascular: Positive for  dyspnea on exertion and orthopnea.  Respiratory: Positive for shortness of breath.   Endocrine: Negative.   Hematologic/Lymphatic: Negative.   Skin: Negative.   Musculoskeletal: Negative.   Gastrointestinal: Negative.   Genitourinary: Negative.   Neurological: Negative.   Psychiatric/Behavioral: The patient is nervous/anxious.    Please see the history of present illness.     All other systems reviewed and are negative.  EKGs/Labs/Other Studies Reviewed:    The following studies were reviewed today:   E[facility] 06/24/2018 with [facility] poor R wave progression personally reviewed.  CXR 01/01/18:    COMPARISON:  None. FINDINGS: Normal heart size. Lungs are under aerated with bibasilar atelectasis. No pneumothorax or pleural effusion. IMPRESSION: No active  cardiopulmonary disease.  Recent Labs: 01/01/2018: ALT 19; BUN 12; Creat 0.81; Hemoglobin 14.0; Platelets 264; Potassium 4.5; Sodium 139; TSH 0.90  Recent Lipid Panel    Component Value Date/Time   CHOL 174 11/17/2013 1035   TRIG 107 11/17/2013 1035   HDL 46 11/17/2013 1035   CHOLHDL 3.8 11/17/2013 1035   VLDL 21 11/17/2013 1035   LDLCALC 107 (H) 11/17/2013 1035    Physical Exam:    VS:  BP 132/88 (BP Location: Right Arm, Patient Position: Sitting, Cuff Size: Large)   Pulse (!) 115   Temp 97.9 F (36.6 C)   Ht 5\' 4"  (1.626 m)   Wt 260 lb 12.8 oz (118.3 kg)   SpO2 97%   BMI 44.77 kg/m     Wt Readings from Last 3 Encounters:  07/08/18 260 lb 12.8 oz (118.3 kg)  07/01/18 257 lb (116.6 kg)  02/18/18 240 lb 4.8 oz (109 kg)     GEN: Central obesity BMI 44 well nourished, well developed in no acute distress HEENT: Normal NECK: No JVD; No carotid bruits LYMPHATICS: No lymphadenopathy CARDIAC: RRR, no murmurs, rubs, gallops RESPIRATORY:  Clear to auscultation without rales, wheezing or rhonchi  ABDOMEN: Soft, non-tender, non-distended MUSCULOSKELETAL:  No edema; No deformity  SKIN: Warm and dry NEUROLOGIC:  Alert and  oriented x 3 PSYCHIATRIC:  Normal affect     Signed, Shirlee More, MD  07/08/2018 8:19 AM    [place]

## 2018-07-08 ENCOUNTER — Other Ambulatory Visit: Payer: Self-pay

## 2018-07-08 ENCOUNTER — Encounter: Payer: Self-pay | Admitting: Cardiology

## 2018-07-08 ENCOUNTER — Ambulatory Visit (INDEPENDENT_AMBULATORY_CARE_PROVIDER_SITE_OTHER): Payer: 59 | Admitting: Cardiology

## 2018-07-08 ENCOUNTER — Encounter: Payer: Self-pay | Admitting: *Deleted

## 2018-07-08 VITALS — BP 138/90 | HR 115 | Temp 97.9°F | Ht 64.0 in | Wt 260.8 lb

## 2018-07-08 DIAGNOSIS — J984 Other disorders of lung: Secondary | ICD-10-CM

## 2018-07-08 DIAGNOSIS — E669 Obesity, unspecified: Secondary | ICD-10-CM | POA: Diagnosis not present

## 2018-07-08 DIAGNOSIS — R030 Elevated blood-pressure reading, without diagnosis of hypertension: Secondary | ICD-10-CM | POA: Diagnosis not present

## 2018-07-08 DIAGNOSIS — R0602 Shortness of breath: Secondary | ICD-10-CM | POA: Diagnosis not present

## 2018-07-08 NOTE — Patient Instructions (Addendum)
Medication Instructions:  Your physician recommends that you continue on your current medications as directed. Please refer to the Current Medication list given to you today.  If you need a refill on your cardiac medications before your next appointment, please call your pharmacy.   Lab work: Your physician recommends that you return for lab work in: TODAY Pro BNP  If you have labs (blood work) drawn today and your tests are completely normal, you will receive your results only by: [name] MyChart Message (if you have MyChart) OR . A paper copy in the mail If you have any lab test that is abnormal or we need to change your treatment, we will call you to review the results.  Testing/Procedures: Your physician has requested that you have a lexiscan myoview. For further information please visit [email or web address]. Please follow instruction sheet, as given.    Follow-Up: At [facility], you and your health needs are our priority.  As part of our continuing mission to provide you with exceptional heart care, we have created designated Provider Care Teams.  These Care Teams include your primary Cardiologist (physician) and Advanced Practice Providers (APPs -  Physician Assistants and Nurse Practitioners) who all work together to provide you with the care you need, when you need it. You will need a follow up appointment in 6 weeks.   Any Other Special Instructions Will Be Listed Below (If Applicable). Cardiac Nuclear Scan A cardiac nuclear scan is a test that is done to check the flow of blood to your heart. It is done when you are resting and when you are exercising. The test looks for problems such as:  Not enough blood reaching a portion of the heart.  The heart muscle not working as it should. You may need this test if:  You have heart disease.  You have had lab results that are not normal.  You have had heart surgery or a balloon procedure to open up blocked arteries (angioplasty).   You have chest pain.  You have shortness of breath. In this test, a special dye (tracer) is put into your bloodstream. The tracer will travel to your heart. A camera will then take pictures of your heart to see how the tracer moves through your heart. This test is usually done at a hospital and takes 2-4 hours. Tell a doctor about:  Any allergies you have.  All medicines you are taking, including vitamins, herbs, eye drops, creams, and over-the-counter medicines.  Any problems you or family members have had with anesthetic medicines.  Any blood disorders you have.  Any surgeries you have had.  Any medical conditions you have.  Whether you are pregnant or may be pregnant. What are the risks? Generally, this is a safe test. However, problems may occur, such as:  Serious chest pain and heart attack. This is only a risk if the stress portion of the test is done.  Rapid heartbeat.  A feeling of warmth in your chest. This feeling usually does not last long.  Allergic reaction to the tracer. What happens before the test?  Ask your doctor about changing or stopping your normal medicines. This is important.  Follow instructions from your doctor about what you cannot eat or drink.  Remove your jewelry on the day of the test. What happens during the test?  An IV tube will be inserted into one of your veins.  Your doctor will give you a small amount of tracer through the IV tube.  You will  wait for 20-40 minutes while the tracer moves through your bloodstream.  Your heart will be monitored with an electrocardiogram (ECG).  You will lie down on an exam table.  Pictures of your heart will be taken for about 15-20 minutes.  You may also have a stress test. For this test, one of these things may be done: ? You will be asked to exercise on a treadmill or a stationary bike. ? You will be given medicines that will make your heart work harder. This is done if you are unable to  exercise.  When blood flow to your heart has peaked, a tracer will again be given through the IV tube.  After 20-40 minutes, you will get back on the exam table. More pictures will be taken of your heart.  Depending on the tracer that is used, more pictures may need to be taken 3-4 hours later.  Your IV tube will be removed when the test is over. The test may vary among doctors and hospitals. What happens after the test?  Ask your doctor: ? Whether you can return to your normal schedule, including diet, activities, and medicines. ? Whether you should drink more fluids. This will help to remove the tracer from your body. Drink enough fluid to keep your pee (urine) pale yellow.  Ask your doctor, or the department that is doing the test: ? When will my results be ready? ? How will I get my results? Summary  A cardiac nuclear scan is a test that is done to check the flow of blood to your heart.  Tell your doctor whether you are pregnant or may be pregnant.  Before the test, ask your doctor about changing or stopping your normal medicines. This is important.  Ask your doctor whether you can return to your normal activities. You may be asked to drink more fluids. This information is not intended to replace advice given to you by your health care provider. Make sure you discuss any questions you have with your health care provider. Document Released: 06/18/2017 Document Revised: 06/18/2017 Document Reviewed: 06/18/2017 Elsevier Interactive Patient Education  2019 [employer].

## 2018-07-09 LAB — PRO B NATRIURETIC PEPTIDE: NT-Pro BNP: 25 pg/mL (ref 0–249)

## 2018-07-11 ENCOUNTER — Encounter (HOSPITAL_COMMUNITY): Payer: 59

## 2018-07-11 ENCOUNTER — Telehealth (HOSPITAL_COMMUNITY): Payer: Self-pay

## 2018-07-11 NOTE — Telephone Encounter (Signed)
Spoke with the patient, instructions given, and asked to call back with any questions. S.Tully Mcinturff EMTP 

## 2018-07-15 ENCOUNTER — Ambulatory Visit
Admission: RE | Admit: 2018-07-15 | Discharge: 2018-07-15 | Disposition: A | Discharge status: Home / Self Care | Payer: 59 | Source: Ambulatory Visit | Attending: General Surgery | Admitting: General Surgery

## 2018-07-15 ENCOUNTER — Other Ambulatory Visit: Payer: Self-pay

## 2018-07-15 ENCOUNTER — Encounter (HOSPITAL_COMMUNITY): Payer: Self-pay

## 2018-07-15 ENCOUNTER — Ambulatory Visit (HOSPITAL_BASED_OUTPATIENT_CLINIC_OR_DEPARTMENT_OTHER): Payer: 59

## 2018-07-15 VITALS — Ht 64.0 in | Wt 260.0 lb

## 2018-07-15 DIAGNOSIS — R0602 Shortness of breath: Secondary | ICD-10-CM | POA: Diagnosis not present

## 2018-07-15 DIAGNOSIS — Z1231 Encounter for screening mammogram for malignant neoplasm of breast: Secondary | ICD-10-CM | POA: Diagnosis not present

## 2018-07-15 LAB — MYOCARDIAL PERFUSION IMAGING
LV dias vol: 56 mL (ref 46–106)
LV sys vol: 20 mL
Peak HR: 122 {beats}/min
Rest HR: 86 {beats}/min
SDS: 1
SRS: 0
SSS: 1
TID: 0.95

## 2018-07-15 MED ORDER — REGADENOSON 0.4 MG/5ML IV SOLN
0.4000 mg | Freq: Once | INTRAVENOUS | Status: AC
  Administered 2018-07-15: 0.4 mg via INTRAVENOUS

## 2018-07-15 MED ORDER — TECHNETIUM TC 99M TETROFOSMIN IV KIT
32.8000 | PACK | Freq: Once | INTRAVENOUS | Status: AC | PRN
  Administered 2018-07-15: 32.8 via INTRAVENOUS
  Filled 2018-07-15: qty 33

## 2018-07-15 MED ORDER — TECHNETIUM TC 99M TETROFOSMIN IV KIT
10.1000 | PACK | Freq: Once | INTRAVENOUS | Status: AC | PRN
  Administered 2018-07-15: 10.1 via INTRAVENOUS
  Filled 2018-07-15: qty 11

## 2018-07-16 ENCOUNTER — Ambulatory Visit (HOSPITAL_BASED_OUTPATIENT_CLINIC_OR_DEPARTMENT_OTHER)
Admission: RE | Admit: 2018-07-16 | Discharge: 2018-07-16 | Disposition: A | Discharge status: Home / Self Care | Payer: 59 | Source: Ambulatory Visit | Attending: Cardiology | Admitting: Cardiology

## 2018-07-16 DIAGNOSIS — R0602 Shortness of breath: Secondary | ICD-10-CM | POA: Diagnosis not present

## 2018-07-16 NOTE — Progress Notes (Signed)
  Echocardiogram 2D Echocardiogram has been performed.  Charlotte Hobbs 07/16/2018, 9:41 AM

## 2018-07-18 MED FILL — lamoTRIgine 200 MG TABS: 200 | 30 days supply | Qty: 60 | Fill #0

## 2018-07-18 MED FILL — VRAYLAR 1.5 MG CAPSULE: 1.5 | 30 days supply | Qty: 30 | Fill #0

## 2018-07-18 MED FILL — buPROPion HCL ER (XL) 300 M: 300 | 30 days supply | Qty: 30 | Fill #0

## 2018-07-18 MED FILL — metFORMIN HCL ER 500 MG TB2: 500 | 60 days supply | Qty: 120 | Fill #0

## 2018-07-22 ENCOUNTER — Ambulatory Visit (INDEPENDENT_AMBULATORY_CARE_PROVIDER_SITE_OTHER): Payer: 59 | Admitting: Psychology

## 2018-07-22 DIAGNOSIS — F509 Eating disorder, unspecified: Secondary | ICD-10-CM

## 2018-08-05 ENCOUNTER — Ambulatory Visit: Payer: 59

## 2018-08-05 DIAGNOSIS — F319 Bipolar disorder, unspecified: Secondary | ICD-10-CM | POA: Diagnosis not present

## 2018-08-07 MED FILL — PROPRANOLOL 20 MG TABLET: 20 | 22 days supply | Qty: 90 | Fill #0

## 2018-08-08 ENCOUNTER — Ambulatory Visit: Payer: Self-pay | Admitting: Surgery

## 2018-08-08 DIAGNOSIS — M545 Low back pain: Secondary | ICD-10-CM | POA: Diagnosis not present

## 2018-08-08 DIAGNOSIS — E669 Obesity, unspecified: Secondary | ICD-10-CM | POA: Diagnosis not present

## 2018-08-08 DIAGNOSIS — G8929 Other chronic pain: Secondary | ICD-10-CM | POA: Diagnosis not present

## 2018-08-08 DIAGNOSIS — Z87891 Personal history of nicotine dependence: Secondary | ICD-10-CM | POA: Diagnosis not present

## 2018-08-08 DIAGNOSIS — N393 Stress incontinence (female) (male): Secondary | ICD-10-CM | POA: Diagnosis not present

## 2018-08-08 DIAGNOSIS — J984 Other disorders of lung: Secondary | ICD-10-CM | POA: Diagnosis not present

## 2018-08-08 DIAGNOSIS — R030 Elevated blood-pressure reading, without diagnosis of hypertension: Secondary | ICD-10-CM | POA: Diagnosis not present

## 2018-08-08 NOTE — H&P (Signed)
Surgical H&P  CC: morbid obesity  HPI: following up for management of morbid obesity and to transfer her care.  She was initially evaluated by Dr. Excell Seltzer in January of this year for consideration of surgical treatment for morbid obesity.  She has completed the preoperative pathway without any barriers identified. Dietitian: Otis Brace 02/18/18- improved, still has some behavioral issues to continue to work on Psychologist: Sharen Hint 07/22/18- approved Chest x-ray December 2019, upper GI 06/24/18- negative, no mention of hiatal hernia Labs reviewed, no significant abnormalities  Her health has been unchanged over the last few months, although she has gained weight.  This is at least partially attributable to the fact that she quit smoking which is fantastic.  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  Denies any issues with nausea, heartburn, abdominal pain or constipation.   Initial visit 01/28/18: weight 236.6, BMI 40.6 "The patient is a 48 year old female who presents with obesity. Patient presents for consideration for surgical treatment for morbid obesity. The patient gives a history of progressive obesity since childhood despite multiple attempts at medical management. She has been through innumerable efforts at diet and exercise and has been able to lose up to 75 pounds at a time but then experienced progressive weight regain. Obesity has been affecting the patient in a number of ways including increasing difficulty with daily activities. 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 She has had back surgery and feels that her large abdominal weight contributes to ongoing back discomfort. She has increasing shortness of breath with exertion finding it difficult to get up a flight of stairs. She recently had pulmonary function testing by her primary  physician and the symptoms were felt to be obesity related. She has developed stress urinary incontinence. She is concerned about her long-term health going forward at her current weight. She is followed for bipolar disease which is stable. The patient has been to our initial information seminar, researched surgical options thoroughly, and is interested in sleeve gastrectomy. Marland KitchenMarland KitchenMarland KitchenPatient with progressive morbid obesity unresponsive to multiple efforts at medical management who presents with a BMI of 40.6 and comorbidities of chronic back pain and stress urinary incontinence. I believe there would be very significant medical benefit from surgical weight loss. After our discussion of surgical options currently available the patient has decided to proceed with laparoscopic sleeve gastrectomy due to the reasons above. We have discussed the nature of the problem and the risks of remaining morbidly obese. We discussed laparoscopic sleeve gastrectomy in detail including the nature of the procedure, expected hospitalization and recovery, and major risks of anesthetic complications, bleeding, blood clots and pulmonary emboli, leakage and infection and long-term risks of stricture, , bowel obstruction, reflux, nutritional deficiencies, and failure to lose weight or weight regain. We discussed these problems could lead to death. We discussed that the procedure is not reversible. We discussed possible need for conversion to gastric bypass or other procedure. The patient was given a complete consent form to review and all questions were answered. We will go ahead with preoperative evaluation including psychological and nutrition evaluations, UGI series, and routine lab and x-rays. I will see the patient back following these studies"  No Known Allergies  Past Medical History:  Diagnosis Date  . Depression    bi polar  . [facility])     Past Surgical History:  Procedure Laterality Date  .  CESAREAN SECTION  2002  .  LUMBAR LAMINECTOMY/DECOMPRESSION MICRODISCECTOMY  11/13/2011   Procedure: LUMBAR LAMINECTOMY/DECOMPRESSION MICRODISCECTOMY 2 LEVELS;  Surgeon: Ophelia Charter, MD;  Location: [facility];  Service: Neurosurgery;  Laterality: Right;  RIGHT Lumbar four-five Lumbar five sacral one diskectomy  . MYOMECTOMY  1996    Family History  Problem Relation Age of Onset  . Hypertension Father   . Heart attack Father 46  . Anxiety disorder Father   . Heart attack Paternal Grandfather 41  . Heart disease Maternal Grandmother     Social History   Socioeconomic History  . Marital status: Divorced    Spouse name: Not on file  . Number of children: 1  . Years of education: Not on file  . Highest education level: Not on file  Occupational History  . Occupation: Research scientist (life sciences): [employer]  Social Needs  . Financial resource strain: Not on file  . Food insecurity    Worry: Not on file    Inability: Not on file  . Transportation needs    Medical: Not on file    Non-medical: Not on file  Tobacco Use  . Smoking status: Former Smoker    Packs/day: 0.50    Years: 25.00    Pack years: 12.50    Types: Cigarettes    Quit date: 03/2018    Years since quitting: 0.3  . Smokeless tobacco: Never Used  Substance and Sexual Activity  . Alcohol use: Not Currently    Alcohol/week: 0.0 standard drinks  . Drug use: No  . Sexual activity: Not Currently    Birth control/protection: Condom  Lifestyle  . Physical activity    Days per week: Not on file    Minutes per session: Not on file  . Stress: Not on file  Relationships  . Social Herbalist on phone: Not on file    Gets together: Not on file    Attends religious service: Not on file    Active member of club or organization: Not on file    Attends meetings of clubs or organizations: Not on file    Relationship status: Not on file  Other Topics Concern  . Not on file  Social History Narrative   No regular  exercise.  Divorced.     Current Outpatient Medications on File Prior to Visit  Medication Sig Dispense Refill  . buPROPion HCl (WELLBUTRIN PO) Take 1 tablet by mouth daily.    . cariprazine (VRAYLAR) capsule Take 1 capsule by mouth daily.     . metFORMIN (GLUCOPHAGE-XR) 500 MG 24 hr tablet Take 500 mg by mouth 2 (two) times a day.    . nicotine (NICOTINE STEP 3) 7 mg/24hr patch Place 7 mg onto the skin daily.    . propranolol (INDERAL) 20 MG tablet Take 20 mg by mouth 3 (three) times daily.     No current facility-administered medications on file prior to visit.     Review of Systems: a complete, 10pt review of systems was completed with pertinent positives and negatives as documented in the HPI  Physical Exam: 08/08/2018 10:50 AM Weight: 266 lb Height: 64in Body Surface Area: 2.21 m Body Mass Index: 45.66 kg/m  Temp.: 97.5F  Pulse: 81 (Regular)  BP: 122/88 (Sitting, Left Arm, Standard)  Gen: alert and well appearing Eye: extraocular motion intact, no scleral icterus ENT: moist mucus membranes, dentition intact Neck: no mass or thyromegaly Chest: unlabored respirations, symmetrical  air entry, clear bilaterally CV: regular rate and rhythm, no pedal edema Abdomen: soft, nontender, nondistended. No mass or organomegaly MSK: strength symmetrical throughout, no deformity Neuro: grossly intact, normal gait Psych: normal mood and affect, appropriate insight Skin: warm and dry, no rash or lesion on limited exam    CBC Latest Ref Rng & Units 01/01/2018 11/17/2013 10/09/2012  WBC 3.8 - 10.8 Thousand/uL 7.2 9.1 7.6  Hemoglobin 11.7 - 15.5 g/dL 14.0 13.7 14.2  Hematocrit 35.0 - 45.0 % 41.4 40.3 41.8  Platelets 140 - 400 Thousand/uL 264 221 284    CMP Latest Ref Rng & Units 01/01/2018 10/05/2014 11/17/2013  Glucose 65 - 99 mg/dL 85 90 93  BUN 7 - 25 mg/dL 12 11 9   Creatinine 0.50 - 1.10 mg/dL 0.81 0.85 0.69  Sodium 135 - 146 mmol/L 139 141 140  Potassium 3.5 - 5.3  mmol/L 4.5 4.5 4.4  Chloride 98 - 110 mmol/L 104 106 105  CO2 20 - 32 mmol/L 28 24 23   Calcium 8.6 - 10.2 mg/dL 9.7 8.4(L) 8.7  Total Protein 6.1 - 8.1 g/dL 6.5 - 6.2  Total Bilirubin 0.2 - 1.2 mg/dL 0.4 - 0.4  Alkaline Phos 39 - 117 U/L - - 68  AST 10 - 35 U/L 19 - 13  ALT 6 - 29 U/L 19 - 12    No results found for: INR, PROTIME  Imaging: Echo 07/16/18: 1. The left ventricle has normal systolic function, with an ejection fraction of 55-60%. The cavity size was normal. Left ventricular diastolic Doppler parameters are consistent with impaired relaxation.  2. The right ventricle has normal systolic function. The cavity was normal. There is no increase in right ventricular wall thickness.  3. No evidence of mitral valve stenosis.  4. The aortic valve is tricuspid. No stenosis of the aortic valve.  5. The aortic root, ascending aorta and aortic arch are normal in size and structure.  6. The interatrial septum appears to be lipomatous.  A/P: MORBID OBESITY, UNSPECIFIED OBESITY TYPE (E66.01) Story: She remains an appropriate candidate for sleeve gastrectomy. We went over the surgery once more and discussed the risks as detailed above. Discussed the typical postoperative course and the importance of lifelong behavioral change to ensure sustained success. Questions were answered. Plan to proceed pending insurance authorization.   CHRONIC LOW BACK PAIN, UNSPECIFIED BACK PAIN LATERALITY, UNSPECIFIED WHETHER SCIATICA PRESENT (M54.5)   URINARY, INCONTINENCE, STRESS FEMALE (N39.3)   BLOOD PRESSURE ELEVATED WITHOUT HISTORY OF HTN (R03.0) Story: cared for by Dr. Bettina Gavia cardiology and Dr. Suzi Roots, PCP- no medications at this time   [place] [phone]) Story: Reports that she has quit.   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

## 2018-08-19 ENCOUNTER — Ambulatory Visit: Payer: 59

## 2018-08-27 MED FILL — buPROPion HCL ER (XL) 300 M: 300 | 30 days supply | Qty: 30 | Fill #1

## 2018-08-27 MED FILL — lamoTRIgine 200 MG TABS: 200 | 30 days supply | Qty: 60 | Fill #1

## 2018-09-02 ENCOUNTER — Other Ambulatory Visit: Payer: Self-pay

## 2018-09-02 ENCOUNTER — Ambulatory Visit: Payer: 59 | Admitting: Cardiology

## 2018-09-02 ENCOUNTER — Encounter: Payer: 59 | Attending: Surgery | Admitting: Skilled Nursing Facility1

## 2018-09-02 DIAGNOSIS — E669 Obesity, unspecified: Secondary | ICD-10-CM | POA: Diagnosis not present

## 2018-09-02 NOTE — Progress Notes (Signed)
Pre-Operative Nutrition Class:  Appt start time: 3343   End time:  1830.  Patient was seen on 09/02/2018 for Pre-Operative Bariatric Surgery Education [facility].   Surgery date: 09/16/18 Surgery type: sleeve Start weight at [facility]: 240.3 Weight today: 265.8  The following the learning objectives were met by the patient during this course:  Identify Pre-Op Dietary Goals and will begin 2 weeks pre-operatively  Identify appropriate sources of fluids and proteins   State protein recommendations and appropriate sources pre and post-operatively  Identify Post-Operative Dietary Goals and will follow for 2 weeks post-operatively  Identify appropriate multivitamin and calcium sources  Describe the need for physical activity post-operatively and will follow MD recommendations  State when to call healthcare provider regarding medication questions or post-operative complications  Handouts given during class include:  Pre-Op Bariatric Surgery Diet Handout  Protein Shake Handout  Post-Op Bariatric Surgery Nutrition Handout  BELT Program Information Flyer  Support Group Information Flyer  [facility] Bariatric Supplements Price List  Follow-Up Plan: Patient will follow-up at [facility] 2 weeks post operatively for diet advancement per MD.

## 2018-09-04 MED FILL — PROPRANOLOL 20 MG TABLET: 20 | 22 days supply | Qty: 90 | Fill #1

## 2018-09-10 NOTE — Patient Instructions (Addendum)
YOU NEED TO HAVE A COVID 19 TEST ON__8/27_____ @_______ , THIS TEST MUST BE DONE BEFORE SURGERY, [address]E  [address] , [id number]. ONCE YOUR COVID TEST IS COMPLETED, PLEASE BEGIN THE QUARANTINE INSTRUCTIONS AS OUTLINED IN YOUR HANDOUT.                LASYA POLCYN   Your procedure is scheduled on: Monday 09/16/18   Report to [facility] Main  Entrance  Report to admitting at 11:28 AM   1 VISITOR IS ALLOWED TO WAIT IN WAITING ROOM  ONLY DAY OF YOUR SURGERY.  NO VISITORS ARE ALLOWED IN SHORT STAY OR RECOVERY ROOM.   Call this number if you have problems the morning of surgery [phone]    . BRUSH YOUR TEETH MORNING OF SURGERY AND RINSE YOUR MOUTH OUT, NO CHEWING GUM CANDY OR MINTS.  Do not eat food After Midnight.   YOU MAY HAVE CLEAR LIQUIDS FROM MIDNIGHT UNTIL 10:15 AM . At 10:15 AM Please finish the prescribed Pre-Surgery drink. Nothing by mouth after you finish the  drink !    Take these medicines the morning of surgery with A SIP OF WATER: Wellbutrin,Prpranolol,Cariprzine, Lamictal                                 You may not have any metal on your body including hair pins and              piercings              Do not wear jewelry, make-up, lotions, powders or perfumes, deodorant             Do not wear nail polish.  Do not shave  48 hours prior to surgery.     Do not bring valuables to the hospital. [place].  Contacts, dentures or bridgework may not be worn into surgery.                   Please read over the following fact sheets you were given: _____________________________________________________________________             [facility] - Preparing for Surgery  Before surgery, you can play an important role.   Because skin is not sterile, your skin needs to be as free of germs as possible .  You can reduce the number of germs on your skin by washing with CHG (chlorahexidine  gluconate) soap before surgery.   CHG is an antiseptic cleaner which kills germs and bonds with the skin to continue killing germs even after washing. Please DO NOT use if you have an allergy to CHG or antibacterial soaps.   If your skin becomes reddened/irritated stop using the CHG and inform your nurse when you arrive at Short Stay. Do not shave (including legs and underarms) for at least 48 hours prior to the first CHG shower.  . Please follow these instructions carefully:  1.  Shower with CHG Soap the night before surgery and the  morning of Surgery.  2.  If you choose to wash your hair, wash your hair first as usual with your  normal  shampoo.  3.  After you shampoo, rinse your hair and body thoroughly to remove the  shampoo.  4.  Use CHG as you would any other liquid soap.  You can apply chg directly  to the skin and wash                       Gently with a scrungie or clean washcloth.  5.  Apply the CHG Soap to your body ONLY FROM THE NECK DOWN.   Do not use on face/ open                           Wound or open sores. Avoid contact with eyes, ears mouth and genitals (private parts).                       Wash face,  Genitals (private parts) with your normal soap.             6.  Wash thoroughly, paying special attention to the area where your surgery  will be performed.  7.  Thoroughly rinse your body with warm water from the neck down.  8.  DO NOT shower/wash with your normal soap after using and rinsing off  the CHG Soap.             9.  Pat yourself dry with a clean towel.            10.  Wear clean pajamas.            11.  Place clean sheets on your bed the night of your first shower and do not  sleep with pets. Day of Surgery : Do not apply any lotions/deodorants the morning of surgery.  Please wear clean clothes to the hospital/surgery center.  FAILURE TO FOLLOW THESE INSTRUCTIONS MAY RESULT IN THE CANCELLATION OF YOUR SURGERY PATIENT  SIGNATURE_________________________________  NURSE SIGNATURE__________________________________  ________________________________________________________________________   Adam Phenix  An incentive spirometer is a tool that can help keep your lungs clear and active. This tool measures how well you are filling your lungs with each breath. Taking long deep breaths may help reverse or decrease the chance of developing breathing (pulmonary) problems (especially infection) following:  A long period of time when you are unable to move or be active. BEFORE THE PROCEDURE   If the spirometer includes an indicator to show your best effort, your nurse or respiratory therapist will set it to a desired goal.  If possible, sit up straight or lean slightly forward. Try not to slouch.  Hold the incentive spirometer in an upright position. INSTRUCTIONS FOR USE  1. Sit on the edge of your bed if possible, or sit up as far as you can in bed or on a chair. 2. Hold the incentive spirometer in an upright position. 3. Breathe out normally. 4. Place the mouthpiece in your mouth and seal your lips tightly around it. 5. Breathe in slowly and as deeply as possible, raising the piston or the ball toward the top of the column. 6. Hold your breath for 3-5 seconds or for as long as possible. Allow the piston or ball to fall to the bottom of the column. 7. Remove the mouthpiece from your mouth and breathe out normally. 8. Rest for a few seconds and repeat Steps 1 through 7 at least 10 times every 1-2 hours when you are awake. Take your time and take a few normal breaths between deep breaths. 9. The spirometer may include an indicator to show your best effort.  Use the indicator as a goal to work toward during each repetition. 10. After each set of 10 deep breaths, practice coughing to be sure your lungs are clear. If you have an incision (the cut made at the time of surgery), support your incision when coughing  by placing a pillow or rolled up towels firmly against it. Once you are able to get out of bed, walk around indoors and cough well. You may stop using the incentive spirometer when instructed by your caregiver.  RISKS AND COMPLICATIONS  Take your time so you do not get dizzy or light-headed.  If you are in pain, you may need to take or ask for pain medication before doing incentive spirometry. It is harder to take a deep breath if you are having pain. AFTER USE  Rest and breathe slowly and easily.  It can be helpful to keep track of a log of your progress. Your caregiver can provide you with a simple table to help with this. If you are using the spirometer at home, follow these instructions: Reserve IF:   You are having difficultly using the spirometer.  You have trouble using the spirometer as often as instructed.  Your pain medication is not giving enough relief while using the spirometer.  You develop fever of 100.5 F (38.1 C) or higher. SEEK IMMEDIATE MEDICAL CARE IF:   You cough up bloody sputum that had not been present before.  You develop fever of 102 F (38.9 C) or greater.  You develop worsening pain at or near the incision site. MAKE SURE YOU:   Understand these instructions.  Will watch your condition.  Will get help right away if you are not doing well or get worse. Document Released: 05/15/2006 Document Revised: 03/27/2011 Document Reviewed: 07/16/2006 [facility] Patient Information 2014 [place], [place].   ________________________________________________________________________

## 2018-09-11 ENCOUNTER — Encounter (HOSPITAL_COMMUNITY)
Admission: RE | Admit: 2018-09-11 | Discharge: 2018-09-11 | Disposition: A | Discharge status: Home / Self Care | Payer: 59 | Source: Ambulatory Visit | Attending: Surgery | Admitting: Surgery

## 2018-09-11 ENCOUNTER — Other Ambulatory Visit: Payer: Self-pay

## 2018-09-11 ENCOUNTER — Encounter (HOSPITAL_COMMUNITY): Payer: Self-pay

## 2018-09-11 DIAGNOSIS — F319 Bipolar disorder, unspecified: Secondary | ICD-10-CM | POA: Insufficient documentation

## 2018-09-11 DIAGNOSIS — Z87891 Personal history of nicotine dependence: Secondary | ICD-10-CM | POA: Insufficient documentation

## 2018-09-11 DIAGNOSIS — R030 Elevated blood-pressure reading, without diagnosis of hypertension: Secondary | ICD-10-CM | POA: Insufficient documentation

## 2018-09-11 DIAGNOSIS — Z7984 Long term (current) use of oral hypoglycemic drugs: Secondary | ICD-10-CM | POA: Diagnosis not present

## 2018-09-11 DIAGNOSIS — J984 Other disorders of lung: Secondary | ICD-10-CM | POA: Diagnosis not present

## 2018-09-11 DIAGNOSIS — Z79899 Other long term (current) drug therapy: Secondary | ICD-10-CM | POA: Insufficient documentation

## 2018-09-11 DIAGNOSIS — K219 Gastro-esophageal reflux disease without esophagitis: Secondary | ICD-10-CM | POA: Insufficient documentation

## 2018-09-11 DIAGNOSIS — Z20828 Contact with and (suspected) exposure to other viral communicable diseases: Secondary | ICD-10-CM | POA: Diagnosis not present

## 2018-09-11 DIAGNOSIS — R32 Unspecified urinary incontinence: Secondary | ICD-10-CM | POA: Diagnosis not present

## 2018-09-11 DIAGNOSIS — Z01812 Encounter for preprocedural laboratory examination: Secondary | ICD-10-CM | POA: Diagnosis not present

## 2018-09-11 DIAGNOSIS — Z6841 Body Mass Index (BMI) 40.0 and over, adult: Secondary | ICD-10-CM | POA: Insufficient documentation

## 2018-09-11 HISTORY — DX: Bipolar disorder, unspecified: F31.9

## 2018-09-11 LAB — CBC WITH DIFFERENTIAL/PLATELET
Abs Immature Granulocytes: 0.06 10*3/uL (ref 0.00–0.07)
Basophils Absolute: 0 10*3/uL (ref 0.0–0.1)
Basophils Relative: 0 %
Eosinophils Absolute: 0.1 10*3/uL (ref 0.0–0.5)
Eosinophils Relative: 1 %
HCT: 44.9 % (ref 36.0–46.0)
Hemoglobin: 14.4 g/dL (ref 12.0–15.0)
Immature Granulocytes: 1 %
Lymphocytes Relative: 28 %
Lymphs Abs: 2 10*3/uL (ref 0.7–4.0)
MCH: 31.2 pg (ref 26.0–34.0)
MCHC: 32.1 g/dL (ref 30.0–36.0)
MCV: 97.2 fL (ref 80.0–100.0)
Monocytes Absolute: 0.7 10*3/uL (ref 0.1–1.0)
Monocytes Relative: 9 %
Neutro Abs: 4.4 10*3/uL (ref 1.7–7.7)
Neutrophils Relative %: 61 %
Platelets: 264 10*3/uL (ref 150–400)
RBC: 4.62 MIL/uL (ref 3.87–5.11)
RDW: 13.3 % (ref 11.5–15.5)
WBC: 7.3 10*3/uL (ref 4.0–10.5)
nRBC: 0 % (ref 0.0–0.2)

## 2018-09-11 LAB — COMPREHENSIVE METABOLIC PANEL
ALT: 28 U/L (ref 0–44)
AST: 22 U/L (ref 15–41)
Albumin: 4.2 g/dL (ref 3.5–5.0)
Alkaline Phosphatase: 66 U/L (ref 38–126)
Anion gap: 8 (ref 5–15)
BUN: 28 mg/dL — ABNORMAL HIGH (ref 6–20)
CO2: 26 mmol/L (ref 22–32)
Calcium: 8.9 mg/dL (ref 8.9–10.3)
Chloride: 105 mmol/L (ref 98–111)
Creatinine, Ser: 0.98 mg/dL (ref 0.44–1.00)
GFR calc Af Amer: >60 mL/min (ref >60)
GFR calc non Af Amer: >60 mL/min (ref >60)
Glucose, Bld: 116 mg/dL — ABNORMAL HIGH (ref 70–99)
Potassium: 5 mmol/L (ref 3.5–5.1)
Sodium: 139 mmol/L (ref 135–145)
Total Bilirubin: 0.6 mg/dL (ref 0.3–1.2)
Total Protein: 7.5 g/dL (ref 6.5–8.1)

## 2018-09-11 LAB — SURGICAL PCR SCREEN
MRSA, PCR: NEGATIVE
Staphylococcus aureus: POSITIVE — AB

## 2018-09-11 LAB — ABO/RH: ABO/RH(D): A POS

## 2018-09-11 NOTE — Progress Notes (Addendum)
PCP - Dr. Madilyn Fireman Cardiologist -none   Chest x-ray - no EKG -  Stress Test -  ECHO -  Cardiac Cath -   Sleep Study -  CPAP -   Fasting Blood Sugar - NA Checks Blood Sugar _____ times a day  Blood Thinner Instructions:NA Aspirin Instructions: Last Dose:  Anesthesia review:   Patient denies shortness of breath, fever, cough and chest pain at PAT appointment yes   Patient verbalized understanding of instructions that were given to them at the PAT appointment. Patient was also instructed that they will need to review over the PAT instructions again at home before surgery. Yes  BUN 28.

## 2018-09-12 ENCOUNTER — Other Ambulatory Visit (HOSPITAL_COMMUNITY)
Admission: RE | Admit: 2018-09-12 | Discharge: 2018-09-12 | Disposition: A | Discharge status: Home / Self Care | Payer: 59 | Source: Ambulatory Visit | Attending: Surgery | Admitting: Surgery

## 2018-09-12 ENCOUNTER — Other Ambulatory Visit (HOSPITAL_COMMUNITY): Payer: 59

## 2018-09-12 DIAGNOSIS — J984 Other disorders of lung: Secondary | ICD-10-CM | POA: Diagnosis not present

## 2018-09-12 DIAGNOSIS — Z87891 Personal history of nicotine dependence: Secondary | ICD-10-CM | POA: Diagnosis not present

## 2018-09-12 DIAGNOSIS — R32 Unspecified urinary incontinence: Secondary | ICD-10-CM | POA: Diagnosis not present

## 2018-09-12 DIAGNOSIS — R030 Elevated blood-pressure reading, without diagnosis of hypertension: Secondary | ICD-10-CM | POA: Diagnosis not present

## 2018-09-12 DIAGNOSIS — Z20828 Contact with and (suspected) exposure to other viral communicable diseases: Secondary | ICD-10-CM | POA: Diagnosis not present

## 2018-09-12 DIAGNOSIS — Z01812 Encounter for preprocedural laboratory examination: Secondary | ICD-10-CM | POA: Diagnosis not present

## 2018-09-12 DIAGNOSIS — K219 Gastro-esophageal reflux disease without esophagitis: Secondary | ICD-10-CM | POA: Diagnosis not present

## 2018-09-12 DIAGNOSIS — Z6841 Body Mass Index (BMI) 40.0 and over, adult: Secondary | ICD-10-CM | POA: Diagnosis not present

## 2018-09-12 LAB — SARS CORONAVIRUS 2 (TAT 6-24 HRS): SARS Coronavirus 2: NEGATIVE

## 2018-09-12 NOTE — Progress Notes (Signed)
Anesthesia Chart Review   Case: [id number] Date/Time: 09/16/18 1313   Procedure: LAPAROSCOPIC GASTRIC SLEEVE RESECTION, Upper Endo, ERAS Pathway (N/A )   Anesthesia type: General   Pre-op diagnosis: Morbid Obesity, GERD, Restrictive Lung Disease, Urinary Incontinence, Elevated Blood Pressure without Diagnosis of Hypertension   Location: [facility] ROOM 02 / [facility]   Surgeon: Clovis Riley, MD      DISCUSSION:48 y.o. former smoker (12.5 pack years, quit 03/17/2018) with h/o depression, morbid obesity, GERD, restrictive lung disease scheduled for above procedure 09/16/2018 with Dr. Romana Juniper.    Seen by cardiologist, Dr. Shirlee More, for evaluation of shortness of breath 07/08/2018.  Echo 07/16/2018 with EF 55-60%, normal valves.  Stress Test 07/15/2018 low risk study.    Anticipate pt can proceed with planned procedure barring acute status change.    VS: BP 120/83   Pulse 74   Temp 36.6 C (Oral)   Resp 16   Ht 5\' 4"  (1.626 m)   Wt 120.2 kg   LMP 08/10/2018   SpO2 98%   BMI 45.49 kg/m   PROVIDERS: Hali Marry, MD  Shirlee More, MD is Cardiologist  LABS: Labs reviewed: Acceptable for surgery. (all labs ordered are listed, but only abnormal results are displayed)  Labs Reviewed  SURGICAL PCR SCREEN - Abnormal; Notable for the following components:      Result Value   Staphylococcus aureus POSITIVE (*)    All other components within normal limits  COMPREHENSIVE METABOLIC PANEL - Abnormal; Notable for the following components:   Glucose, Bld 116 (*)    BUN 28 (*)    All other components within normal limits  CBC WITH DIFFERENTIAL/PLATELET  TYPE AND SCREEN  ABO/RH     IMAGES:   EKG: 06/24/2018 Rate 70 bpm Normal sinus rhythm  Cannot rule out Anterior infarct, age undetermined Abnormal ECG   CV: Echo 07/16/2018 IMPRESSIONS   1. The left ventricle has normal systolic function, with an ejection fraction of 55-60%. The cavity size was normal. Left  ventricular diastolic Doppler parameters are consistent with impaired relaxation.  2. The right ventricle has normal systolic function. The cavity was normal. There is no increase in right ventricular wall thickness.  3. No evidence of mitral valve stenosis.  4. The aortic valve is tricuspid. No stenosis of the aortic valve.  5. The aortic root, ascending aorta and aortic arch are normal in size and structure.  6. The interatrial septum appears to be lipomatous.  FINDINGS  Left Ventricle: The left ventricle has normal systolic function, with an ejection fraction of 55-60%. The cavity size was normal. There is borderline increase in left ventricular wall thickness. Left ventricular diastolic Doppler parameters are  consistent with impaired relaxation. Normal left ventricular filling pressures  Myocardial Perfusion 07/15/2018  Nuclear stress EF: 65%.  There was no ST segment deviation noted during stress.  The perfusion is normal.  This is a low risk study.  The left ventricular ejection fraction is normal (55-65%).  No prior study for comparison. Past Medical History:  Diagnosis Date  . Bipolar disorder ([place])   . Depression    bi polar  . Man[facility])     Past Surgical History:  Procedure Laterality Date  . BACK SURGERY    . CESAREAN SECTION  2002  . LUMBAR LAMINECTOMY/DECOMPRESSION MICRODISCECTOMY  11/13/2011   Procedure: LUMBAR LAMINECTOMY/DECOMPRESSION MICRODISCECTOMY 2 LEVELS;  Surgeon: Ophelia Charter, MD;  Location: [facility];  Service: Neurosurgery;  Laterality: Right;  RIGHT  Lumbar four-five Lumbar five sacral one diskectomy  . MYOMECTOMY  1996    MEDICATIONS: . buPROPion (WELLBUTRIN XL) 300 MG 24 hr tablet  . cariprazine (VRAYLAR) capsule  . lamoTRIgine (LAMICTAL) 200 MG tablet  . metFORMIN (GLUCOPHAGE-XR) 500 MG 24 hr tablet  . propranolol (INDERAL) 20 MG tablet   No current facility-administered medications for this encounter.

## 2018-09-13 ENCOUNTER — Other Ambulatory Visit: Payer: Self-pay | Admitting: *Deleted

## 2018-09-13 MED FILL — VRAYLAR 1.5 MG CAPSULE: 1.5 | 30 days supply | Qty: 30 | Fill #0

## 2018-09-13 NOTE — Patient Outreach (Signed)
[facility]) Care Management  09/13/2018  DARNITA BUICK Feb 25, 1970 [id number]  Preoperative Screening Call/Transition of Care Referral received:  08/29/18 Surgery/procedure date: 09/16/18 Insurance: [facility] Choice Plan  Subjective:  Initial successful telephone call to patient's cell/home number in order to complete preoperative screening. 2 HIPAA identifiers verified. Discussed purpose of preoperative call. Patient voices understanding and agrees to call. She states she understands the reason for the surgery and the expected time of arrival, 11:40 am. She says she completed her preadmission testing on 09/11/18 and has no additional questions. She says she expects to be in the hospital 1-2 days. She states she has completed medical leave paperwork and did purchase the hospital indemnity insurance and is aware she will have to file the claim after her surgery. She says she will have 24/7 care at home to assist in her recovery provided bu her daughter. She has a completed advanced directive. She agrees to a post hospital discharge transition of care call.    Objective:  Per chart review, patient is scheduled for laparoscopic gastric sleeve on 09/16/18 at [facility].  Assessment: Preoperative call completed, no preoperative needs identified.   Plan: RNCM will call patient for post hospital transition of care outreach within 72 hours of hospital discharge notification.  Barrington Ellison RN,CCM,CDE [place] Management Coordinator Office Phone [phone] Office Fax [phone]

## 2018-09-15 MED ORDER — BUPIVACAINE LIPOSOME 1.3 % IJ SUSP
20.0000 mL | Freq: Once | INTRAMUSCULAR | Status: DC
  Filled 2018-09-15: qty 20

## 2018-09-16 ENCOUNTER — Inpatient Hospital Stay (HOSPITAL_COMMUNITY): Payer: 59 | Admitting: Physician Assistant

## 2018-09-16 ENCOUNTER — Encounter (HOSPITAL_COMMUNITY): Payer: Self-pay

## 2018-09-16 ENCOUNTER — Encounter (HOSPITAL_COMMUNITY)
Admission: RE | Disposition: A | Discharge status: Home / Self Care | Payer: Self-pay | Source: Ambulatory Visit | Attending: Surgery

## 2018-09-16 ENCOUNTER — Inpatient Hospital Stay (HOSPITAL_COMMUNITY)
Admission: RE | Admit: 2018-09-16 | Discharge: 2018-09-19 | DRG: 620 | Disposition: A | Discharge status: Home / Self Care | Payer: 59 | Source: Ambulatory Visit | Attending: Surgery | Admitting: Surgery

## 2018-09-16 ENCOUNTER — Inpatient Hospital Stay (HOSPITAL_COMMUNITY): Payer: 59 | Admitting: Certified Registered Nurse Anesthetist

## 2018-09-16 ENCOUNTER — Other Ambulatory Visit: Payer: Self-pay

## 2018-09-16 DIAGNOSIS — F319 Bipolar disorder, unspecified: Secondary | ICD-10-CM | POA: Diagnosis present

## 2018-09-16 DIAGNOSIS — Z79899 Other long term (current) drug therapy: Secondary | ICD-10-CM | POA: Diagnosis not present

## 2018-09-16 DIAGNOSIS — Z818 Family history of other mental and behavioral disorders: Secondary | ICD-10-CM

## 2018-09-16 DIAGNOSIS — N393 Stress incontinence (female) (male): Secondary | ICD-10-CM | POA: Diagnosis present

## 2018-09-16 DIAGNOSIS — I10 Essential (primary) hypertension: Secondary | ICD-10-CM | POA: Diagnosis not present

## 2018-09-16 DIAGNOSIS — F3130 Bipolar disorder, current episode depressed, mild or moderate severity, unspecified: Secondary | ICD-10-CM | POA: Diagnosis not present

## 2018-09-16 DIAGNOSIS — Z87891 Personal history of nicotine dependence: Secondary | ICD-10-CM | POA: Diagnosis not present

## 2018-09-16 DIAGNOSIS — Z6841 Body Mass Index (BMI) 40.0 and over, adult: Secondary | ICD-10-CM | POA: Diagnosis not present

## 2018-09-16 DIAGNOSIS — D62 Acute posthemorrhagic anemia: Secondary | ICD-10-CM | POA: Diagnosis not present

## 2018-09-16 DIAGNOSIS — Z6838 Body mass index (BMI) 38.0-38.9, adult: Secondary | ICD-10-CM | POA: Diagnosis present

## 2018-09-16 DIAGNOSIS — R0602 Shortness of breath: Secondary | ICD-10-CM | POA: Diagnosis present

## 2018-09-16 HISTORY — PX: LAPAROSCOPIC GASTRIC SLEEVE RESECTION: SHX5895

## 2018-09-16 LAB — PREGNANCY, URINE: Preg Test, Ur: NEGATIVE

## 2018-09-16 SURGERY — GASTRECTOMY, SLEEVE, LAPAROSCOPIC
Anesthesia: General

## 2018-09-16 MED ORDER — KETOROLAC TROMETHAMINE 15 MG/ML IJ SOLN
15.0000 mg | INTRAMUSCULAR | Status: DC
  Administered 2018-09-16: 15 mg via INTRAVENOUS
  Filled 2018-09-16: qty 1

## 2018-09-16 MED ORDER — STERILE WATER FOR IRRIGATION IR SOLN
Status: DC | PRN
  Administered 2018-09-16: 2000 mL

## 2018-09-16 MED ORDER — LIDOCAINE 2% (20 MG/ML) 5 ML SYRINGE
INTRAMUSCULAR | Status: AC
  Filled 2018-09-16: qty 5

## 2018-09-16 MED ORDER — LIDOCAINE 2% (20 MG/ML) 5 ML SYRINGE
INTRAMUSCULAR | Status: DC | PRN
  Administered 2018-09-16: 1.5 mg/kg/h via INTRAVENOUS

## 2018-09-16 MED ORDER — PROPRANOLOL HCL 20 MG PO TABS
20.0000 mg | ORAL_TABLET | Freq: Three times a day (TID) | ORAL | Status: DC
  Administered 2018-09-17 – 2018-09-19 (×4): 20 mg via ORAL
  Filled 2018-09-16 (×9): qty 1

## 2018-09-16 MED ORDER — MIDAZOLAM HCL 5 MG/5ML IJ SOLN
INTRAMUSCULAR | Status: DC | PRN
  Administered 2018-09-16: 2 mg via INTRAVENOUS

## 2018-09-16 MED ORDER — ROCURONIUM BROMIDE 10 MG/ML (PF) SYRINGE
PREFILLED_SYRINGE | INTRAVENOUS | Status: AC
  Filled 2018-09-16: qty 10

## 2018-09-16 MED ORDER — METHOCARBAMOL 500 MG IVPB - SIMPLE MED
500.0000 mg | Freq: Four times a day (QID) | INTRAVENOUS | Status: DC | PRN
  Filled 2018-09-16: qty 50

## 2018-09-16 MED ORDER — FENTANYL CITRATE (PF) 100 MCG/2ML IJ SOLN
INTRAMUSCULAR | Status: AC
  Filled 2018-09-16: qty 2

## 2018-09-16 MED ORDER — SUGAMMADEX SODIUM 500 MG/5ML IV SOLN
INTRAVENOUS | Status: AC
  Filled 2018-09-16: qty 5

## 2018-09-16 MED ORDER — DEXAMETHASONE SODIUM PHOSPHATE 4 MG/ML IJ SOLN: 4.0000 mg | INTRAMUSCULAR | Status: DC

## 2018-09-16 MED ORDER — METOCLOPRAMIDE HCL 5 MG/ML IJ SOLN
10.0000 mg | Freq: Four times a day (QID) | INTRAMUSCULAR | Status: DC
  Administered 2018-09-16 – 2018-09-17 (×3): 10 mg via INTRAVENOUS
  Filled 2018-09-16 (×3): qty 2

## 2018-09-16 MED ORDER — BUPIVACAINE HCL (PF) 0.25 % IJ SOLN
INTRAMUSCULAR | Status: DC | PRN
  Administered 2018-09-16: 30 mL

## 2018-09-16 MED ORDER — ACETAMINOPHEN 160 MG/5ML PO SOLN
1000.0000 mg | Freq: Three times a day (TID) | ORAL | Status: DC
  Administered 2018-09-17 (×2): 1000 mg via ORAL
  Filled 2018-09-16 (×3): qty 40.6

## 2018-09-16 MED ORDER — SUCCINYLCHOLINE CHLORIDE 200 MG/10ML IV SOSY
PREFILLED_SYRINGE | INTRAVENOUS | Status: AC
  Filled 2018-09-16: qty 10

## 2018-09-16 MED ORDER — ENSURE MAX PROTEIN PO LIQD
2.0000 [oz_av] | ORAL | Status: DC
  Administered 2018-09-17 – 2018-09-19 (×14): 2 [oz_av] via ORAL

## 2018-09-16 MED ORDER — BUPIVACAINE HCL (PF) 0.25 % IJ SOLN
INTRAMUSCULAR | Status: AC
  Filled 2018-09-16: qty 30

## 2018-09-16 MED ORDER — CHLORHEXIDINE GLUCONATE 4 % EX LIQD: 60.0000 mL | Freq: Once | CUTANEOUS | Status: DC

## 2018-09-16 MED ORDER — DEXAMETHASONE SODIUM PHOSPHATE 10 MG/ML IJ SOLN
INTRAMUSCULAR | Status: DC | PRN
  Administered 2018-09-16: 6 mg via INTRAVENOUS

## 2018-09-16 MED ORDER — HYDROMORPHONE HCL 1 MG/ML IJ SOLN
0.5000 mg | INTRAMUSCULAR | Status: DC | PRN
  Administered 2018-09-16: 0.5 mg via INTRAVENOUS
  Filled 2018-09-16: qty 0.5

## 2018-09-16 MED ORDER — PHENYLEPHRINE 40 MCG/ML (10ML) SYRINGE FOR IV PUSH (FOR BLOOD PRESSURE SUPPORT)
PREFILLED_SYRINGE | INTRAVENOUS | Status: AC
  Filled 2018-09-16: qty 10

## 2018-09-16 MED ORDER — SODIUM CHLORIDE 0.9 % IV SOLN
INTRAVENOUS | Status: DC
  Administered 2018-09-16 – 2018-09-18 (×5): via INTRAVENOUS

## 2018-09-16 MED ORDER — LAMOTRIGINE 100 MG PO TABS
200.0000 mg | ORAL_TABLET | Freq: Two times a day (BID) | ORAL | Status: DC
  Administered 2018-09-17 – 2018-09-19 (×3): 200 mg via ORAL
  Filled 2018-09-16 (×4): qty 2

## 2018-09-16 MED ORDER — SUGAMMADEX SODIUM 200 MG/2ML IV SOLN
INTRAVENOUS | Status: DC | PRN
  Administered 2018-09-16: 300 mg via INTRAVENOUS

## 2018-09-16 MED ORDER — ONDANSETRON HCL 4 MG/2ML IJ SOLN: 4.0000 mg | INTRAMUSCULAR | Status: DC | PRN

## 2018-09-16 MED ORDER — LIDOCAINE 2% (20 MG/ML) 5 ML SYRINGE
INTRAMUSCULAR | Status: DC | PRN
  Administered 2018-09-16: 60 mg via INTRAVENOUS

## 2018-09-16 MED ORDER — PHENYLEPHRINE 40 MCG/ML (10ML) SYRINGE FOR IV PUSH (FOR BLOOD PRESSURE SUPPORT)
PREFILLED_SYRINGE | INTRAVENOUS | Status: DC | PRN
  Administered 2018-09-16 (×3): 80 ug via INTRAVENOUS
  Administered 2018-09-16: 120 ug via INTRAVENOUS

## 2018-09-16 MED ORDER — PANTOPRAZOLE SODIUM 40 MG IV SOLR
40.0000 mg | Freq: Every day | INTRAVENOUS | Status: DC
  Administered 2018-09-16 – 2018-09-17 (×2): 40 mg via INTRAVENOUS
  Filled 2018-09-16 (×2): qty 40

## 2018-09-16 MED ORDER — DEXAMETHASONE SODIUM PHOSPHATE 10 MG/ML IJ SOLN
INTRAMUSCULAR | Status: AC
  Filled 2018-09-16: qty 1

## 2018-09-16 MED ORDER — ONDANSETRON HCL 4 MG/2ML IJ SOLN
INTRAMUSCULAR | Status: DC | PRN
  Administered 2018-09-16: 4 mg via INTRAVENOUS

## 2018-09-16 MED ORDER — ROCURONIUM BROMIDE 50 MG/5ML IV SOSY
PREFILLED_SYRINGE | INTRAVENOUS | Status: DC | PRN
  Administered 2018-09-16 (×2): 10 mg via INTRAVENOUS
  Administered 2018-09-16: 60 mg via INTRAVENOUS

## 2018-09-16 MED ORDER — PROPOFOL 10 MG/ML IV BOLUS
INTRAVENOUS | Status: DC | PRN
  Administered 2018-09-16: 200 mg via INTRAVENOUS

## 2018-09-16 MED ORDER — ACETAMINOPHEN 500 MG PO TABS: 1000.0000 mg | ORAL_TABLET | Freq: Three times a day (TID) | ORAL | Status: DC

## 2018-09-16 MED ORDER — ENOXAPARIN SODIUM 30 MG/0.3ML ~~LOC~~ SOLN: 30.0000 mg | Freq: Two times a day (BID) | SUBCUTANEOUS | Status: DC

## 2018-09-16 MED ORDER — MIDAZOLAM HCL 2 MG/2ML IJ SOLN
INTRAMUSCULAR | Status: AC
  Filled 2018-09-16: qty 2

## 2018-09-16 MED ORDER — APREPITANT 40 MG PO CAPS
40.0000 mg | ORAL_CAPSULE | ORAL | Status: AC
  Administered 2018-09-16: 40 mg via ORAL
  Filled 2018-09-16: qty 1

## 2018-09-16 MED ORDER — SUCCINYLCHOLINE CHLORIDE 200 MG/10ML IV SOSY
PREFILLED_SYRINGE | INTRAVENOUS | Status: DC | PRN
  Administered 2018-09-16: 125 mg via INTRAVENOUS

## 2018-09-16 MED ORDER — OXYCODONE HCL 5 MG/5ML PO SOLN: 5.0000 mg | Freq: Once | ORAL | Status: DC | PRN

## 2018-09-16 MED ORDER — GABAPENTIN 300 MG PO CAPS
300.0000 mg | ORAL_CAPSULE | ORAL | Status: AC
  Administered 2018-09-16: 12:00:00 300 mg via ORAL
  Filled 2018-09-16: qty 1

## 2018-09-16 MED ORDER — KETAMINE HCL 10 MG/ML IJ SOLN
INTRAMUSCULAR | Status: DC | PRN
  Administered 2018-09-16: 30 mg via INTRAVENOUS

## 2018-09-16 MED ORDER — TRAMADOL HCL 50 MG PO TABS
50.0000 mg | ORAL_TABLET | Freq: Four times a day (QID) | ORAL | Status: DC | PRN
  Administered 2018-09-16: 50 mg via ORAL
  Filled 2018-09-16: qty 1

## 2018-09-16 MED ORDER — ACETAMINOPHEN 500 MG PO TABS
1000.0000 mg | ORAL_TABLET | ORAL | Status: AC
  Administered 2018-09-16: 1000 mg via ORAL
  Filled 2018-09-16: qty 2

## 2018-09-16 MED ORDER — ENOXAPARIN SODIUM 40 MG/0.4ML ~~LOC~~ SOLN
40.0000 mg | SUBCUTANEOUS | Status: AC
  Administered 2018-09-16: 12:00:00 40 mg via SUBCUTANEOUS
  Filled 2018-09-16: qty 0.4

## 2018-09-16 MED ORDER — ONDANSETRON HCL 4 MG/2ML IJ SOLN
INTRAMUSCULAR | Status: AC
  Filled 2018-09-16: qty 2

## 2018-09-16 MED ORDER — FENTANYL CITRATE (PF) 100 MCG/2ML IJ SOLN
25.0000 ug | INTRAMUSCULAR | Status: DC | PRN
  Administered 2018-09-16: 50 ug via INTRAVENOUS

## 2018-09-16 MED ORDER — OXYCODONE HCL 5 MG PO TABS: 5.0000 mg | ORAL_TABLET | Freq: Once | ORAL | Status: DC | PRN

## 2018-09-16 MED ORDER — 0.9 % SODIUM CHLORIDE (POUR BTL) OPTIME
TOPICAL | Status: DC | PRN
  Administered 2018-09-16: 14:00:00 1000 mL

## 2018-09-16 MED ORDER — ONDANSETRON HCL 4 MG/2ML IJ SOLN: 4.0000 mg | Freq: Four times a day (QID) | INTRAMUSCULAR | Status: DC | PRN

## 2018-09-16 MED ORDER — LACTATED RINGERS IR SOLN
Status: DC | PRN
  Administered 2018-09-16: 1000 mL

## 2018-09-16 MED ORDER — PROPOFOL 10 MG/ML IV BOLUS
INTRAVENOUS | Status: AC
  Filled 2018-09-16: qty 20

## 2018-09-16 MED ORDER — BUPIVACAINE LIPOSOME 1.3 % IJ SUSP
INTRAMUSCULAR | Status: DC | PRN
  Administered 2018-09-16: 20 mL

## 2018-09-16 MED ORDER — LACTATED RINGERS IV SOLN
INTRAVENOUS | Status: DC
  Administered 2018-09-16 (×2): via INTRAVENOUS

## 2018-09-16 MED ORDER — SIMETHICONE 80 MG PO CHEW: 80.0000 mg | CHEWABLE_TABLET | Freq: Four times a day (QID) | ORAL | Status: DC | PRN

## 2018-09-16 MED ORDER — CARIPRAZINE HCL 1.5 MG PO CAPS
1.5000 mg | ORAL_CAPSULE | Freq: Every day | ORAL | Status: DC
  Administered 2018-09-19: 1.5 mg via ORAL
  Filled 2018-09-16 (×2): qty 1

## 2018-09-16 MED ORDER — FENTANYL CITRATE (PF) 250 MCG/5ML IJ SOLN
INTRAMUSCULAR | Status: AC
  Filled 2018-09-16: qty 5

## 2018-09-16 MED ORDER — GABAPENTIN 100 MG PO CAPS
200.0000 mg | ORAL_CAPSULE | Freq: Two times a day (BID) | ORAL | Status: DC
  Administered 2018-09-17 – 2018-09-19 (×3): 200 mg via ORAL
  Filled 2018-09-16 (×4): qty 2

## 2018-09-16 MED ORDER — SODIUM CHLORIDE 0.9 % IV SOLN
2.0000 g | INTRAVENOUS | Status: AC
  Administered 2018-09-16: 14:00:00 2 g via INTRAVENOUS
  Filled 2018-09-16: qty 2

## 2018-09-16 MED ORDER — SCOPOLAMINE 1 MG/3DAYS TD PT72
1.0000 | MEDICATED_PATCH | TRANSDERMAL | Status: DC
  Administered 2018-09-16: 1.5 mg via TRANSDERMAL
  Filled 2018-09-16: qty 1

## 2018-09-16 MED ORDER — FENTANYL CITRATE (PF) 250 MCG/5ML IJ SOLN
INTRAMUSCULAR | Status: DC | PRN
  Administered 2018-09-16: 50 ug via INTRAVENOUS
  Administered 2018-09-16: 100 ug via INTRAVENOUS

## 2018-09-16 MED ORDER — BUPROPION HCL ER (XL) 300 MG PO TB24
300.0000 mg | ORAL_TABLET | Freq: Every day | ORAL | Status: DC
  Administered 2018-09-19: 300 mg via ORAL
  Filled 2018-09-16 (×2): qty 1

## 2018-09-16 MED ORDER — DOCUSATE SODIUM 100 MG PO CAPS
100.0000 mg | ORAL_CAPSULE | Freq: Two times a day (BID) | ORAL | Status: DC
  Administered 2018-09-19: 10:00:00 100 mg via ORAL
  Filled 2018-09-16 (×4): qty 1

## 2018-09-16 MED ORDER — HYDRALAZINE HCL 20 MG/ML IJ SOLN: 10.0000 mg | INTRAMUSCULAR | Status: DC | PRN

## 2018-09-16 MED ORDER — SODIUM CHLORIDE 0.9 % IV SOLN
INTRAVENOUS | Status: DC | PRN
  Administered 2018-09-16: 14:00:00 25 ug/min via INTRAVENOUS

## 2018-09-16 SURGICAL SUPPLY — 83 items
APL PRP STRL LF DISP 70% ISPRP (MISCELLANEOUS) ×2
APL SKNCLS STERI-STRIP NONHPOA (GAUZE/BANDAGES/DRESSINGS) ×1
APL SWBSTK 6 STRL LF DISP (MISCELLANEOUS)
APPLICATOR COTTON TIP 6 STRL (MISCELLANEOUS) IMPLANT
APPLICATOR COTTON TIP 6IN STRL (MISCELLANEOUS)
APPLIER CLIP 5 13 M/L LIGAMAX5 (MISCELLANEOUS) ×2
APPLIER CLIP ROT 10 11.4 M/L (STAPLE)
APPLIER CLIP ROT 13.4 12 LRG (CLIP)
APR CLP LRG 13.4X12 ROT 20 MLT (CLIP)
APR CLP MED LRG 11.4X10 (STAPLE)
APR CLP MED LRG 5 ANG JAW (MISCELLANEOUS) ×1
BAG LAPAROSCOPIC 12 15 PORT 16 (BASKET) IMPLANT
BAG RETRIEVAL 12/15 (BASKET) ×2
BENZOIN TINCTURE PRP APPL 2/3 (GAUZE/BANDAGES/DRESSINGS) ×2 IMPLANT
BLADE SURG SZ11 CARB STEEL (BLADE) ×2 IMPLANT
BNDG ADH 1X3 SHEER STRL LF (GAUZE/BANDAGES/DRESSINGS) ×12 IMPLANT
BNDG ADH THN 3X1 STRL LF (GAUZE/BANDAGES/DRESSINGS) ×6
CABLE HIGH FREQUENCY MONO STRZ (ELECTRODE) ×1 IMPLANT
CHLORAPREP W/TINT 26 (MISCELLANEOUS) ×4 IMPLANT
CLIP APPLIE 5 13 M/L LIGAMAX5 (MISCELLANEOUS) IMPLANT
CLIP APPLIE ROT 10 11.4 M/L (STAPLE) IMPLANT
CLIP APPLIE ROT 13.4 12 LRG (CLIP) IMPLANT
COVER SURGICAL LIGHT HANDLE (MISCELLANEOUS) ×2 IMPLANT
COVER WAND RF STERILE (DRAPES) IMPLANT
DECANTER SPIKE VIAL GLASS SM (MISCELLANEOUS) ×2 IMPLANT
DEVICE SUT QUICK LOAD TK 5 (STAPLE) IMPLANT
DEVICE SUT TI-KNOT TK 5X26 (MISCELLANEOUS) IMPLANT
DRAPE UTILITY XL STRL (DRAPES) ×4 IMPLANT
ELECT REM PT RETURN 15FT ADLT (MISCELLANEOUS) ×2 IMPLANT
GAUZE SPONGE 4X4 12PLY STRL (GAUZE/BANDAGES/DRESSINGS) IMPLANT
GLOVE BIO SURGEON STRL SZ 6 (GLOVE) ×2 IMPLANT
GLOVE BIOGEL PI IND STRL 7.0 (GLOVE) IMPLANT
GLOVE BIOGEL PI IND STRL 7.5 (GLOVE) IMPLANT
GLOVE BIOGEL PI INDICATOR 7.0 (GLOVE) ×1
GLOVE BIOGEL PI INDICATOR 7.5 (GLOVE) ×2
GLOVE INDICATOR 6.5 STRL GRN (GLOVE) ×2 IMPLANT
GLOVE INDICATOR 8.0 STRL GRN (GLOVE) ×1 IMPLANT
GOWN STRL REUS W/TWL LRG LVL3 (GOWN DISPOSABLE) ×2 IMPLANT
GOWN STRL REUS W/TWL XL LVL3 (GOWN DISPOSABLE) ×5 IMPLANT
GRASPER SUT TROCAR 14GX15 (MISCELLANEOUS) ×2 IMPLANT
HOVERMATT SINGLE USE (MISCELLANEOUS) ×2 IMPLANT
KIT BASIN OR (CUSTOM PROCEDURE TRAY) ×2 IMPLANT
KIT TURNOVER KIT A (KITS) IMPLANT
MARKER SKIN DUAL TIP RULER LAB (MISCELLANEOUS) ×2 IMPLANT
NDL SPNL 22GX3.5 QUINCKE BK (NEEDLE) ×1 IMPLANT
NEEDLE SPNL 22GX3.5 QUINCKE BK (NEEDLE) ×2 IMPLANT
PACK UNIVERSAL I (CUSTOM PROCEDURE TRAY) ×2 IMPLANT
RELOAD ENDO STITCH (ENDOMECHANICALS) IMPLANT
RELOAD STAPLE 60 3.6 BLU REG (STAPLE) ×1 IMPLANT
RELOAD STAPLE 60 3.8 GOLD REG (STAPLE) ×1 IMPLANT
RELOAD STAPLE 60 4.1 GRN THCK (STAPLE) IMPLANT
RELOAD STAPLER BLUE 60MM (STAPLE) ×4 IMPLANT
RELOAD STAPLER GOLD 60MM (STAPLE) ×1 IMPLANT
RELOAD STAPLER GREEN 60MM (STAPLE) ×1 IMPLANT
RELOAD SUT TRIPLE-STITCH 2-0 (ENDOMECHANICALS) IMPLANT
SCISSORS LAP 5X45 EPIX DISP (ENDOMECHANICALS) ×2 IMPLANT
SET IRRIG TUBING LAPAROSCOPIC (IRRIGATION / IRRIGATOR) ×2 IMPLANT
SET TUBE SMOKE EVAC HIGH FLOW (TUBING) ×2 IMPLANT
SHEARS HARMONIC ACE PLUS 45CM (MISCELLANEOUS) ×2 IMPLANT
SLEEVE ADV FIXATION 5X100MM (TROCAR) ×4 IMPLANT
SLEEVE GASTRECTOMY 40FR VISIGI (MISCELLANEOUS) ×2 IMPLANT
SOL ANTI FOG 6CC (MISCELLANEOUS) ×1 IMPLANT
SOLUTION ANTI FOG 6CC (MISCELLANEOUS) ×1
SPONGE LAP 18X18 RF (DISPOSABLE) ×2 IMPLANT
STAPLER ECHELON BIOABSB 60 FLE (MISCELLANEOUS) ×9 IMPLANT
STAPLER ECHELON LONG 60 440 (INSTRUMENTS) ×2 IMPLANT
STAPLER RELOAD BLUE 60MM (STAPLE) ×8
STAPLER RELOAD GOLD 60MM (STAPLE) ×2
STAPLER RELOAD GREEN 60MM (STAPLE) ×2
STRIP CLOSURE SKIN 1/2X4 (GAUZE/BANDAGES/DRESSINGS) ×2 IMPLANT
SUT MNCRL AB 4-0 PS2 18 (SUTURE) ×2 IMPLANT
SUT SURGIDAC NAB ES-9 0 48 120 (SUTURE) IMPLANT
SUT VICRYL 0 TIES 12 18 (SUTURE) ×2 IMPLANT
SYR 10ML ECCENTRIC (SYRINGE) ×2 IMPLANT
SYR 20ML LL LF (SYRINGE) ×2 IMPLANT
SYR 50ML LL SCALE MARK (SYRINGE) ×2 IMPLANT
TOWEL OR 17X26 10 PK STRL BLUE (TOWEL DISPOSABLE) ×2 IMPLANT
TOWEL OR NON WOVEN STRL DISP B (DISPOSABLE) ×2 IMPLANT
TROCAR ADV FIXATION 5X100MM (TROCAR) ×2 IMPLANT
TROCAR BLADELESS 15MM (ENDOMECHANICALS) ×2 IMPLANT
TROCAR BLADELESS OPT 5 100 (ENDOMECHANICALS) ×2 IMPLANT
TUBING CONNECTING 10 (TUBING) ×2 IMPLANT
TUBING ENDO SMARTCAP (MISCELLANEOUS) ×2 IMPLANT

## 2018-09-16 NOTE — H&P (Signed)
Surgical H&P  CC: morbid obesity  HPI: following up for management of morbid obesity and to transfer her care.  She was initially evaluated by Dr. Excell Seltzer in January of this year for consideration of surgical treatment for morbid obesity.  She has completed the preoperative pathway without any barriers identified. Dietitian: Otis Brace 02/18/18- improved, still has some behavioral issues to continue to work on Psychologist: Sharen Hint 07/22/18- approved Chest x-ray December 2019, upper GI 06/24/18- negative, no mention of hiatal hernia Labs reviewed, no significant abnormalities  Her health has been unchanged over the last few months, although she has gained weight.  This is at least partially attributable to the fact that she quit smoking which is fantastic.  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  Denies any issues with nausea, heartburn, abdominal pain or constipation.   Initial visit 01/28/18: weight 236.6, BMI 40.6 "The patient is a 48 year old female who presents with obesity. Patient presents for consideration for surgical treatment for morbid obesity. The patient gives a history of progressive obesity since childhood despite multiple attempts at medical management. She has been through innumerable efforts at diet and exercise and has been able to lose up to 75 pounds at a time but then experienced progressive weight regain. Obesity has been affecting the patient in a number of ways including increasing difficulty with daily activities. 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 She has had back surgery and feels that her large abdominal weight contributes to ongoing back discomfort. She has increasing shortness of breath with exertion finding it difficult to get up a flight of stairs. She recently had pulmonary function testing by her primary  physician and the symptoms were felt to be obesity related. She has developed stress urinary incontinence. She is concerned about her long-term health going forward at her current weight. She is followed for bipolar disease which is stable. The patient has been to our initial information seminar, researched surgical options thoroughly, and is interested in sleeve gastrectomy. Marland KitchenMarland KitchenMarland KitchenPatient with progressive morbid obesity unresponsive to multiple efforts at medical management who presents with a BMI of 40.6 and comorbidities of chronic back pain and stress urinary incontinence. I believe there would be very significant medical benefit from surgical weight loss. After our discussion of surgical options currently available the patient has decided to proceed with laparoscopic sleeve gastrectomy due to the reasons above. We have discussed the nature of the problem and the risks of remaining morbidly obese. We discussed laparoscopic sleeve gastrectomy in detail including the nature of the procedure, expected hospitalization and recovery, and major risks of anesthetic complications, bleeding, blood clots and pulmonary emboli, leakage and infection and long-term risks of stricture, , bowel obstruction, reflux, nutritional deficiencies, and failure to lose weight or weight regain. We discussed these problems could lead to death. We discussed that the procedure is not reversible. We discussed possible need for conversion to gastric bypass or other procedure. The patient was given a complete consent form to review and all questions were answered. We will go ahead with preoperative evaluation including psychological and nutrition evaluations, UGI series, and routine lab and x-rays. I will see the patient back following these studies"  No Known Allergies      Past Medical History:  Diagnosis Date  . Depression    bi polar  . Mania ([place])  Past Surgical History:  Procedure Laterality Date  . CESAREAN  SECTION  2002  . LUMBAR LAMINECTOMY/DECOMPRESSION MICRODISCECTOMY  11/13/2011   Procedure: LUMBAR LAMINECTOMY/DECOMPRESSION MICRODISCECTOMY 2 LEVELS;  Surgeon: Ophelia Charter, MD;  Location: [place] [facility];  Service: Neurosurgery;  Laterality: Right;  RIGHT Lumbar four-five Lumbar five sacral one diskectomy  . MYOMECTOMY  1996         Family History  Problem Relation Age of Onset  . Hypertension Father   . Heart attack Father 18  . Anxiety disorder Father   . Heart attack Paternal Grandfather 40  . Heart disease Maternal Grandmother     Social History        Socioeconomic History  . Marital status: Divorced    Spouse name: Not on file  . Number of children: 1  . Years of education: Not on file  . Highest education level: Not on file  Occupational History  . Occupation: Research scientist (life sciences): [employer]  Social Needs  . Financial resource strain: Not on file  . Food insecurity    Worry: Not on file    Inability: Not on file  . Transportation needs    Medical: Not on file    Non-medical: Not on file  Tobacco Use  . Smoking status: Former Smoker    Packs/day: 0.50    Years: 25.00    Pack years: 12.50    Types: Cigarettes    Quit date: 03/2018    Years since quitting: 0.3  . Smokeless tobacco: Never Used  Substance and Sexual Activity  . Alcohol use: Not Currently    Alcohol/week: 0.0 standard drinks  . Drug use: No  . Sexual activity: Not Currently    Birth control/protection: Condom  Lifestyle  . Physical activity    Days per week: Not on file    Minutes per session: Not on file  . Stress: Not on file  Relationships  . Social Herbalist on phone: Not on file    Gets together: Not on file    Attends religious service: Not on file    Active member of club or organization: Not on file    Attends meetings of clubs or organizations: Not on file    Relationship status: Not on file   Other Topics Concern  . Not on file  Social History Narrative   No regular exercise.  Divorced.           Current Outpatient Medications on File Prior to Visit  Medication Sig Dispense Refill  . buPROPion HCl (WELLBUTRIN PO) Take 1 tablet by mouth daily.    . cariprazine (VRAYLAR) capsule Take 1 capsule by mouth daily.     . metFORMIN (GLUCOPHAGE-XR) 500 MG 24 hr tablet Take 500 mg by mouth 2 (two) times a day.    . nicotine (NICOTINE STEP 3) 7 mg/24hr patch Place 7 mg onto the skin daily.    . propranolol (INDERAL) 20 MG tablet Take 20 mg by mouth 3 (three) times daily.     No current facility-administered medications on file prior to visit.     Review of Systems: a complete, 10pt review of systems was completed with pertinent positives and negatives as documented in the HPI  Physical Exam: 08/08/2018 10:50 AM Weight: 266 lb Height: 64in Body Surface Area: 2.21 m Body Mass Index: 45.66 kg/m  Temp.: 97.47F  Pulse: 81 (Regular)  BP: 122/88 (Sitting, Left Arm, Standard)  Gen: alert  and well appearing Eye: extraocular motion intact, no scleral icterus ENT: moist mucus membranes, dentition intact Neck: no mass or thyromegaly Chest: unlabored respirations, symmetrical air entry, clear bilaterally CV: regular rate and rhythm, no pedal edema Abdomen: soft, nontender, nondistended. No mass or organomegaly MSK: strength symmetrical throughout, no deformity Neuro: grossly intact, normal gait Psych: normal mood and affect, appropriate insight Skin: warm and dry, no rash or lesion on limited exam    CBC Latest Ref Rng & Units 01/01/2018 11/17/2013 10/09/2012  WBC 3.8 - 10.8 Thousand/uL 7.2 9.1 7.6  Hemoglobin 11.7 - 15.5 g/dL 14.0 13.7 14.2  Hematocrit 35.0 - 45.0 % 41.4 40.3 41.8  Platelets 140 - 400 Thousand/uL 264 221 284    CMP Latest Ref Rng & Units 01/01/2018 10/05/2014 11/17/2013  Glucose 65 - 99 mg/dL 85 90 93  BUN 7 - 25 mg/dL 12 11 9    Creatinine 0.50 - 1.10 mg/dL 0.81 0.85 0.69  Sodium 135 - 146 mmol/L 139 141 140  Potassium 3.5 - 5.3 mmol/L 4.5 4.5 4.4  Chloride 98 - 110 mmol/L 104 106 105  CO2 20 - 32 mmol/L 28 24 23   Calcium 8.6 - 10.2 mg/dL 9.7 8.4(L) 8.7  Total Protein 6.1 - 8.1 g/dL 6.5 - 6.2  Total Bilirubin 0.2 - 1.2 mg/dL 0.4 - 0.4  Alkaline Phos 39 - 117 U/L - - 68  AST 10 - 35 U/L 19 - 13  ALT 6 - 29 U/L 19 - 12    Recent Labs  No results found for: INR, PROTIME    Imaging: Echo 07/16/18: 1. The left ventricle has normal systolic function, with an ejection fraction of 55-60%. The cavity size was normal. Left ventricular diastolic Doppler parameters are consistent with impaired relaxation. 2. The right ventricle has normal systolic function. The cavity was normal. There is no increase in right ventricular wall thickness. 3. No evidence of mitral valve stenosis. 4. The aortic valve is tricuspid. No stenosis of the aortic valve. 5. The aortic root, ascending aorta and aortic arch are normal in size and structure. 6. The interatrial septum appears to be lipomatous.  A/P: MORBID OBESITY, UNSPECIFIED OBESITY TYPE (E66.01) Story: She remains an appropriate candidate for sleeve gastrectomy. We went over the surgery once more and discussed the risks as detailed above. Discussed the typical postoperative course and the importance of lifelong behavioral change to ensure sustained success. Questions were answered. Plan to proceed pending insurance authorization.   CHRONIC LOW BACK PAIN, UNSPECIFIED BACK PAIN LATERALITY, UNSPECIFIED WHETHER SCIATICA PRESENT (M54.5)   URINARY, INCONTINENCE, STRESS FEMALE (N39.3)   BLOOD PRESSURE ELEVATED WITHOUT HISTORY OF HTN (R03.0) Story: cared for by Dr. Bettina Gavia cardiology and Dr. Suzi Roots, PCP- no medications at this time   [place] [phone]) Story: Reports that she has quit.   RESTRICTIVE LUNG DISEASE SECONDARY TO OBESITY  (J98.4)   Romana Juniper, MD [facility] Surgery, [place] Pager [phone]

## 2018-09-16 NOTE — Anesthesia Procedure Notes (Signed)
Procedure Name: Intubation Date/Time: 09/16/2018 1:44 PM Performed by: Maxwell Caul, CRNA Pre-anesthesia Checklist: Patient identified, Emergency Drugs available, Suction available and Patient being monitored Patient Re-evaluated:Patient Re-evaluated prior to induction Oxygen Delivery Method: Circle system utilized Preoxygenation: Pre-oxygenation with 100% oxygen Induction Type: IV induction Laryngoscope Size: Mac and 4 Grade View: Grade I Tube type: Oral Tube size: 7.5 mm Number of attempts: 1 Airway Equipment and Method: Stylet Placement Confirmation: ETT inserted through vocal cords under direct vision,  positive ETCO2 and breath sounds checked- equal and bilateral Secured at: 21 cm Tube secured with: Tape Dental Injury: Teeth and Oropharynx as per pre-operative assessment

## 2018-09-16 NOTE — Transfer of Care (Signed)
Immediate Anesthesia Transfer of Care Note  Patient: Charlotte Hobbs  Procedure(s) Performed: LAPAROSCOPIC GASTRIC SLEEVE RESECTION, Upper Endo, ERAS Pathway (N/A )  Patient Location: PACU  Anesthesia Type:General  Level of Consciousness: awake, alert , oriented and patient cooperative  Airway & Oxygen Therapy: Patient Spontanous Breathing and Patient connected to face mask oxygen  Post-op Assessment: Report given to RN, Post -op Vital signs reviewed and stable and Patient moving all extremities  Post vital signs: Reviewed and stable  Last Vitals:  Vitals Value Taken Time  BP    Temp    Pulse 70 09/16/18 1529  Resp 17 09/16/18 1529  SpO2 99 % 09/16/18 1529  Vitals shown include unvalidated device data.  Last Pain:  Vitals:   09/16/18 1132  TempSrc:   PainSc: 0-No pain         Complications: No apparent anesthesia complications

## 2018-09-16 NOTE — Op Note (Signed)
KLARISSA SIEFER [id number] 12/21/1970 09/16/2018  Preoperative diagnosis: sleeve gastrectomy in progress  Postoperative diagnosis: Same   Procedure: Upper endoscopy   Surgeon: Catalina Antigua B. Hassell Done  M.D., FACS   Anesthesia: Gen.   Indications for procedure: This patient was undergoing a sleeve gastrectomy by Dr. Kae Heller.    Description of procedure: The endoscopy was placed in the mouth and into the oropharynx and under endoscopic vision it was advanced to the esophagogastric junction.  The sleeve was insufflated and the scope was passed to the pylorus.  The sleeve was cylindrical.  There was a small proximal outpocket.    No bleeding or leaks were detected.  The scope was withdrawn without difficulty.     Matt B. Hassell Done, MD, FACS General, Bariatric, & Minimally Invasive Surgery [facility] Surgery, [place]

## 2018-09-16 NOTE — Anesthesia Postprocedure Evaluation (Signed)
Anesthesia Post Note  Patient: Charlotte Hobbs  Procedure(Hobbs) Performed: LAPAROSCOPIC GASTRIC SLEEVE RESECTION, Upper Endo, ERAS Pathway (N/A )     Patient location during evaluation: PACU Anesthesia Type: General Level of consciousness: awake and alert Pain management: pain level controlled Vital Signs Assessment: post-procedure vital signs reviewed and stable Respiratory status: spontaneous breathing, nonlabored ventilation, respiratory function stable and patient connected to nasal cannula oxygen Cardiovascular status: blood pressure returned to baseline and stable Postop Assessment: no apparent nausea or vomiting Anesthetic complications: no    Last Vitals:  Vitals:   09/16/18 1756 09/16/18 2026  BP: (!) 161/88 (!) 145/105  Pulse: 64 65  Resp: 19 20  Temp: 36.6 C 36.6 C  SpO2: 99% 97%    Last Pain:  Vitals:   09/16/18 2026  TempSrc: Oral  PainSc:                  Charlotte Hobbs

## 2018-09-16 NOTE — Progress Notes (Signed)
PHARMACY CONSULT FOR:  Risk Assessment for Post-Discharge VTE Following Bariatric Surgery  Post-Discharge VTE Risk Assessment: This patient's probability of 30-day post-discharge VTE is increased due to the factors marked:   Female    Age >/=60 years    BMI >/=50 kg/m2    CHF    Dyspnea at Rest    Paraplegia   X  Non-gastric-band surgery    Operation Time >/=3 hr    Return to OR     Length of Stay >/= 3 d   Predicted probability of 30-day post-discharge VTE: 0.16%  Other patient-specific factors to consider: none   Recommendation for Discharge: . No pharmacologic prophylaxis post-discharge . Follow daily and recalculate estimated 30d VTE risk if returns to OR or LOS reaches 3 days.   Charlotte Hobbs is a 48 y.o. female who underwent laparoscopic sleeve gastrectomy on 8/31   Case start: 1401 Case end: 1521   No Known Allergies  Patient Measurements: Height: 5\' 4"  (162.6 cm) Weight: 260 lb 6 oz (118.1 kg) IBW/kg (Calculated) : 54.7 Body mass index is 44.69 kg/m.  No results for input(s): WBC, HGB, HCT, PLT, APTT, CREATININE, LABCREA, CREATININE, CREAT24HRUR, MG, PHOS, ALBUMIN, PROT, ALBUMIN, AST, ALT, ALKPHOS, BILITOT, BILIDIR, IBILI in the last 72 hours. Estimated Creatinine Clearance: 88.8 mL/min (by C-G formula based on SCr of 0.98 mg/dL).  Past Medical History:  Diagnosis Date  . Bipolar disorder ([place])   . Depression    bi polar  . Mania ([place])     Medications Prior to Admission  Medication Sig Dispense Refill Last Dose  . buPROPion (WELLBUTRIN XL) 300 MG 24 hr tablet Take 300 mg by mouth daily.    09/15/2018 at Unknown time  . cariprazine (VRAYLAR) capsule Take 1.5 mg by mouth daily.    09/15/2018 at Unknown time  . lamoTRIgine (LAMICTAL) 200 MG tablet Take 200 mg by mouth 2 (two) times daily.   09/15/2018 at Unknown time  . metFORMIN (GLUCOPHAGE-XR) 500 MG 24 hr tablet Take 500 mg by mouth 2 (two) times a day.   09/14/2018  . propranolol (INDERAL) 20 MG tablet  Take 20 mg by mouth 3 (three) times daily.   09/16/2018 at [place], PharmD, BCPS [phone] 09/16/2018, 4:33 PM

## 2018-09-16 NOTE — Discharge Instructions (Signed)
° ° ° °GASTRIC BYPASS/SLEEVE ° Home Care Instructions ° ° These instructions are to help you care for yourself when you go home. ° °Call: If you have any problems. °• Call [phone] and ask for the surgeon on call °• If you need immediate help, come to the ER at [facility].  °• Tell the ER staff that you are a new post-op gastric bypass or gastric sleeve patient °  °Signs and symptoms to report: • Severe vomiting or nausea °o If you cannot keep down clear liquids for longer than 1 day, call your surgeon  °• Abdominal pain that does not get better after taking your pain medication °• Fever over 100.4° F with chills °• Heart beating over 100 beats a minute °• Shortness of breath at rest °• Chest pain °•  Redness, swelling, drainage, or foul odor at incision (surgical) sites °•  If your incisions open or pull apart °• Swelling or pain in calf (lower leg) °• Diarrhea (Loose bowel movements that happen often), frequent watery, uncontrolled bowel movements °• Constipation, (no bowel movements for 3 days) if this happens: Pick one °o Milk of Magnesia, 2 tablespoons by mouth, 3 times a day for 2 days if needed °o Stop taking Milk of Magnesia once you have a bowel movement °o Call your doctor if constipation continues °Or °o Miralax  (instead of Milk of Magnesia) following the label instructions °o Stop taking Miralax once you have a bowel movement °o Call your doctor if constipation continues °• Anything you think is not normal °  °Normal side effects after surgery: • Unable to sleep at night or unable to focus °• Irritability or moody °• Being tearful (crying) or depressed °These are common complaints, possibly related to your anesthesia medications that put you to sleep, stress of surgery, and change in lifestyle.  This usually goes away a few weeks after surgery.  If these feelings continue, call your primary care doctor. °  °Wound Care: You may have surgical glue, steri-strips, or staples over your incisions after  surgery °• Surgical glue:  Looks like a clear film over your incisions and will wear off a little at a time °• Steri-strips: Strips of tape over your incisions. You may notice a yellowish color on the skin under the steri-strips. This is used to make the   steri-strips stick better. Do not pull the steri-strips off - let them fall off °• Staples: Staples may be removed before you leave the hospital °o If you go home with staples, call [facility], [phone] at for an appointment with your surgeon’s nurse to have staples removed 10 days after surgery. °• Showering: You may shower two (2) days after your surgery unless your surgeon tells you differently °o Wash gently around incisions with warm soapy water, rinse well, and gently pat dry  °o No tub baths until staples are removed, steri-strips fall off or glue is gone.  °  °Medications: • Medications should be liquid or crushed if larger than the size of a dime °• Extended release pills (medication that release a little bit at a time through the day) should NOT be crushed or cut. (examples include XL, ER, DR, SR) °• Depending on the size and number of medications you take, you may need to space (take a few throughout the day)/change the time you take your medications so that you do not over-fill your pouch (smaller stomach) °• Make sure you follow-up with your primary care doctor to   make medication changes needed during rapid weight loss and life-style changes °• If you have diabetes, follow up with the doctor that orders your diabetes medication(s) within one week after surgery and check your blood sugar regularly. °• Do not drive while taking prescription pain medication  °• It is ok to take Tylenol by the bottle instructions with your pain medicine or instead of your pain medicine as needed.  DO NOT TAKE NSAIDS (EXAMPLES OF NSAIDS:  IBUPROFREN/ NAPROXEN)  °Diet:                    First 2 Weeks ° You will see the dietician t about two (2) weeks  after your surgery. The dietician will increase the types of foods you can eat if you are handling liquids well: °• If you have severe vomiting or nausea and cannot keep down clear liquids lasting longer than 1 day, call your surgeon @ ([phone]) °Protein Shake °• Drink at least 2 ounces of shake 5-6 times per day °• Each serving of protein shakes (usually 8 - 12 ounces) should have: °o 15 grams of protein  °o And no more than 5 grams of carbohydrate  °• Goal for protein each day: °o Men = 80 grams per day °o Women = 60 grams per day °• Protein powder may be added to fluids such as non-fat milk or Lactaid milk or unsweetened Soy/Almond milk (limit to 35 grams added protein powder per serving) ° °Hydration °• Slowly increase the amount of water and other clear liquids as tolerated (See Acceptable Fluids) °• Slowly increase the amount of protein shake as tolerated  °•  Sip fluids slowly and throughout the day.  Do not use straws. °• May use sugar substitutes in small amounts (no more than 6 - 8 packets per day; i.e. Splenda) ° °Fluid Goal °• The first goal is to drink at least 8 ounces of protein shake/drink per day (or as directed by the nutritionist); some examples of protein shakes are Syntrax Nectar, Adkins Advantage, EAS Edge HP, and Unjury. See handout from pre-op Bariatric Education Class: °o Slowly increase the amount of protein shake you drink as tolerated °o You may find it easier to slowly sip shakes throughout the day °o It is important to get your proteins in first °• Your fluid goal is to drink 64 - 100 ounces of fluid daily °o It may take a few weeks to build up to this °• 32 oz (or more) should be clear liquids  °And  °• 32 oz (or more) should be full liquids (see below for examples) °• Liquids should not contain sugar, caffeine, or carbonation ° °Clear Liquids: °• Water or Sugar-free flavored water (i.e. Fruit H2O, Propel) °• Decaffeinated coffee or tea (sugar-free) °• Crystal Lite, Wyler’s Lite,  Minute Maid Lite °• Sugar-free Jell-O °• Bouillon or broth °• Sugar-free Popsicle:   *Less than 20 calories each; Limit 1 per day ° °Full Liquids: °Protein Shakes/Drinks + 2 choices per day of other full liquids °• Full liquids must be: °o No More Than 15 grams of Carbs per serving  °o No More Than 3 grams of Fat per serving °• Strained low-fat cream soup (except Cream of Potato or Tomato) °• Non-Fat milk °• Fat-free Lactaid Milk °• Unsweetened Soy Or Unsweetened Almond Milk °• Low Sugar yogurt (Dannon Lite & Fit, Greek yogurt; Oikos Triple Zero; Chobani Simply 100; Yoplait 100 calorie Greek - No Fruit on the Bottom) ° °  °Vitamins   and Minerals • Start 1 day after surgery unless otherwise directed by your surgeon °• 2 Chewable Bariatric Specific Multivitamin / Multimineral Supplement with iron (Example: Bariatric Advantage Multi EA) °• Chewable Calcium with Vitamin D-3 °(Example: 3 Chewable Calcium Plus 600 with Vitamin D-3) °o Take 500 mg three (3) times a day for a total of 1500 mg each day °o Do not take all 3 doses of calcium at one time as it may cause constipation, and you can only absorb 500 mg  at a time  °o Do not mix multivitamins containing iron with calcium supplements; take 2 hours apart °• Menstruating women and those with a history of anemia (a blood disease that causes weakness) may need extra iron °o Talk with your doctor to see if you need more iron °• Do not stop taking or change any vitamins or minerals until you talk to your dietitian or surgeon °• Your Dietitian and/or surgeon must approve all vitamin and mineral supplements °  °Activity and Exercise: Limit your physical activity as instructed by your doctor.  It is important to continue walking at home.  During this time, use these guidelines: °• Do not lift anything greater than ten (10) pounds for at least two (2) weeks °• Do not go back to work or drive until your surgeon says you can °• You may have sex when you feel comfortable  °o It is  VERY important for female patients to use a reliable birth control method; fertility often increases after surgery  °o All hormonal birth control will be ineffective for 30 days after surgery due to medications given during surgery a barrier method must be used. °o Do not get pregnant for at least 18 months °• Start exercising as soon as your doctor tells you that you can °o Make sure your doctor approves any physical activity °• Start with a simple walking program °• Walk 5-15 minutes each day, 7 days per week.  °• Slowly increase until you are walking 30-45 minutes per day °Consider joining our BELT program. [phone] or email [email or web address] °  °Special Instructions Things to remember: °• Use your CPAP when sleeping if this applies to you ° °• [facility] has two free Bariatric Surgery Support Groups that meet monthly °o The 3rd Thursday of each month, 6 pm, [facility]  °o The 2nd Friday of each month, 11:45 am in the private dining room in the basement of [facility] °• It is very important to keep all follow up appointments with your surgeon, dietitian, primary care physician, and behavioral health practitioner °• Routine follow up schedule with your surgeon include appointments at 2-3 weeks, 6-8 weeks, 6 months, and 1 year at a minimum.  Your surgeon may request to see you more often.   °o After the first year, please follow up with your bariatric surgeon and dietitian at least once a year in order to maintain best weight loss results °[facility]: [phone] °[facility]: [phone] °Bariatric Nurse Coordinator: [phone] °  °   Reviewed and Endorsed  °by [facility] Patient Education Committee, June, 2016 °Edits Approved: Aug, 2018 ° ° ° °

## 2018-09-16 NOTE — Op Note (Signed)
Operative Note  Charlotte Hobbs  [id number]  [id number]  09/16/2018   Surgeon: Victorino Sparrow ConnorMD FACS  Assistant: Kaylyn Lim MD FACS  Procedure performed: laparoscopic sleeve gastrectomy, upper endoscopy  Preop diagnosis: Morbid obesity Body mass index is 44.69 kg/m. Post-op diagnosis/intraop findings: same  Specimens: fundus Retained items: none EBL: minimal cc Complications: none  Description of procedure: After obtaining informed consent and administration of prophylactic lovenox in holding, the patient was taken to the operating room and placed supine on operating room table wheregeneral endotracheal anesthesia was initiated, preoperative antibiotics were administered, SCDs applied, and a formal timeout was performed. The abdomen was prepped and draped in usual sterile fashion. Peritoneal access was gained using a Visiport technique in the left upper quadrant and insufflation to 15 mmHg ensued without issue. Gross inspection revealed no evidence of injury or other intraabdominal abnormalities.  Under direct visualization three more 5 mm trochars were placed in the right and left hemiabdomen and the 58mm trocar in the right paramedian upper abdomen. Bilateral laparoscopic assisted TAPS blocks were performed with Exparel diluted with 0.25 percent Marcaine with epinephrine. The patient was placed in steep Trendelenburg and the liver retractor was introduced through an incision in the upper midline and secured to the post externally to maintain the left lobe retracted anteriorly.   There was no hiatal hernia. Using the Harmonic scalpel, the greater curvature of the stomach was dissected away from the greater omentum and short gastric vessels were divided. This began 6 cm from the pylorus, and dissection proceeded until the left crus was clearly exposed. There were some filmy adhesions of the posterior stomach to the pancreas which were taken down the Harmonic. The 6 [place] VisiGi was  then introduced and directed down towards the pylorus. This was placed to suction against the lesser curve. Serial fires of the linear cutting stapler with seamguards were then employed to create our sleeve. The first fire used a green load and ensured adequate room at the angularis incisura. One gold load and then 3 blue loads were then employed to create a narrow tubular stomach up to the angle of His. The excised stomach was then removed through our 15 mm trocar site within an Endo Catch bag. The visigi was taken off of suction and a few puffs of air were introduced, inflating the sleeve. No bubbles were observed in the irrigation fluid around the stomach and the shape was noted to be a nice smooth tube without any narrowing at the angularis. The visigi was then removed. Upper endoscopy was performed by the assistant surgeon and the sleeve was noted to be airtight, evenly tubuloar, and the staple line was hemostatic. Please see his separate note. The endoscope was removed. Some small points of oozing on the proximal staple line were addressed prophylactically with clips. Hemostasis was ensured in the field. The 15 mm trocar site fascia in the right upper abdomen was closed with a 0 Vicryl using the laparoscopic suture passer under direct visualization. The liver retractor was removed under direct visualization. The abdomen was then desufflated and all remaining trochars removed. The skin incisions were closed with subcuticular Monocryl; benzoin, Steri-Strips and Band-Aids were applied The patient was then awakened, extubated and taken to PACU in stable condition.    All counts were correct at the completion of the case.

## 2018-09-16 NOTE — Anesthesia Preprocedure Evaluation (Signed)
Anesthesia Evaluation  Patient identified by MRN, date of birth, ID band Patient awake    Reviewed: Allergy & Precautions, H&P , NPO status , Patient's Chart, lab work & pertinent test results  Airway Mallampati: II   Neck ROM: full    Dental   Pulmonary former smoker,    breath sounds clear to auscultation       Cardiovascular negative cardio ROS   Rhythm:regular Rate:Normal     Neuro/Psych PSYCHIATRIC DISORDERS Depression Bipolar Disorder  Neuromuscular disease    GI/Hepatic   Endo/Other  Morbid obesity  Renal/GU      Musculoskeletal   Abdominal   Peds  Hematology   Anesthesia Other Findings   Reproductive/Obstetrics                             Anesthesia Physical Anesthesia Plan  ASA: II  Anesthesia Plan: General   Post-op Pain Management:    Induction: Intravenous  PONV Risk Score and Plan: 3 and Ondansetron, Dexamethasone, Midazolam, Scopolamine patch - Pre-op and Treatment may vary due to age or medical condition  Airway Management Planned: Oral ETT  Additional Equipment:   Intra-op Plan:   Post-operative Plan: Extubation in OR  Informed Consent: I have reviewed the patients History and Physical, chart, labs and discussed the procedure including the risks, benefits and alternatives for the proposed anesthesia with the patient or authorized representative who has indicated his/her understanding and acceptance.       Plan Discussed with: Anesthesiologist, Surgeon and CRNA  Anesthesia Plan Comments:         Anesthesia Quick Evaluation

## 2018-09-16 NOTE — Progress Notes (Signed)
Rounded on patient asleep in chair, awakened easily alert and oriented.  Discussed plan of care with patient and nurse.  Questions answered

## 2018-09-17 ENCOUNTER — Encounter (HOSPITAL_COMMUNITY): Payer: Self-pay | Admitting: Surgery

## 2018-09-17 ENCOUNTER — Inpatient Hospital Stay (HOSPITAL_COMMUNITY): Payer: 59

## 2018-09-17 LAB — CBC WITH DIFFERENTIAL/PLATELET
Abs Immature Granulocytes: 0.07 10*3/uL (ref 0.00–0.07)
Basophils Absolute: 0 10*3/uL (ref 0.0–0.1)
Basophils Relative: 0 %
Eosinophils Absolute: 0 10*3/uL (ref 0.0–0.5)
Eosinophils Relative: 0 %
HCT: 34.4 % — ABNORMAL LOW (ref 36.0–46.0)
Hemoglobin: 11.4 g/dL — ABNORMAL LOW (ref 12.0–15.0)
Immature Granulocytes: 1 %
Lymphocytes Relative: 11 %
Lymphs Abs: 1.5 10*3/uL (ref 0.7–4.0)
MCH: 32.3 pg (ref 26.0–34.0)
MCHC: 33.1 g/dL (ref 30.0–36.0)
MCV: 97.5 fL (ref 80.0–100.0)
Monocytes Absolute: 0.8 10*3/uL (ref 0.1–1.0)
Monocytes Relative: 6 %
Neutro Abs: 10.8 10*3/uL — ABNORMAL HIGH (ref 1.7–7.7)
Neutrophils Relative %: 82 %
Platelets: 267 10*3/uL (ref 150–400)
RBC: 3.53 MIL/uL — ABNORMAL LOW (ref 3.87–5.11)
RDW: 13.3 % (ref 11.5–15.5)
WBC: 13.2 10*3/uL — ABNORMAL HIGH (ref 4.0–10.5)
nRBC: 0 % (ref 0.0–0.2)

## 2018-09-17 LAB — CBC
HCT: 32.3 % — ABNORMAL LOW (ref 36.0–46.0)
Hemoglobin: 10.8 g/dL — ABNORMAL LOW (ref 12.0–15.0)
MCH: 31.7 pg (ref 26.0–34.0)
MCHC: 33.4 g/dL (ref 30.0–36.0)
MCV: 94.7 fL (ref 80.0–100.0)
Platelets: 291 10*3/uL (ref 150–400)
RBC: 3.41 MIL/uL — ABNORMAL LOW (ref 3.87–5.11)
RDW: 13.3 % (ref 11.5–15.5)
WBC: 18.5 10*3/uL — ABNORMAL HIGH (ref 4.0–10.5)
nRBC: 0.2 % (ref 0.0–0.2)

## 2018-09-17 LAB — COMPREHENSIVE METABOLIC PANEL
ALT: 39 U/L (ref 0–44)
AST: 28 U/L (ref 15–41)
Albumin: 3.4 g/dL — ABNORMAL LOW (ref 3.5–5.0)
Alkaline Phosphatase: 53 U/L (ref 38–126)
Anion gap: 11 (ref 5–15)
BUN: 20 mg/dL (ref 6–20)
CO2: 18 mmol/L — ABNORMAL LOW (ref 22–32)
Calcium: 8.3 mg/dL — ABNORMAL LOW (ref 8.9–10.3)
Chloride: 108 mmol/L (ref 98–111)
Creatinine, Ser: 0.7 mg/dL (ref 0.44–1.00)
GFR calc Af Amer: >60 mL/min (ref >60)
GFR calc non Af Amer: >60 mL/min (ref >60)
Glucose, Bld: 150 mg/dL — ABNORMAL HIGH (ref 70–99)
Potassium: 4.4 mmol/L (ref 3.5–5.1)
Sodium: 137 mmol/L (ref 135–145)
Total Bilirubin: 0.7 mg/dL (ref 0.3–1.2)
Total Protein: 6.2 g/dL — ABNORMAL LOW (ref 6.5–8.1)

## 2018-09-17 LAB — MAGNESIUM: Magnesium: 2 mg/dL (ref 1.7–2.4)

## 2018-09-17 MED ORDER — IPRATROPIUM-ALBUTEROL 0.5-2.5 (3) MG/3ML IN SOLN
3.0000 mL | Freq: Four times a day (QID) | RESPIRATORY_TRACT | Status: DC
  Administered 2018-09-17 (×3): 3 mL via RESPIRATORY_TRACT
  Filled 2018-09-17 (×3): qty 3

## 2018-09-17 MED ORDER — IPRATROPIUM-ALBUTEROL 0.5-2.5 (3) MG/3ML IN SOLN: 3.0000 mL | Freq: Three times a day (TID) | RESPIRATORY_TRACT | Status: DC

## 2018-09-17 NOTE — Progress Notes (Signed)
Notified on call provider Dr. Harlow Asa , given verbal order for chest X ray 2 views. We will continue to monitor.

## 2018-09-17 NOTE — Progress Notes (Signed)
Nutrition Note  RD consulted for diet education for patient s/p bariatric surgery. While RDs are working remotely, Insurance risk surveyor providing education.   If nutrition issues arise, please consult RD.   Clayton Bibles, MS, RD, [facility] Dietitian Pager: [phone] After Hours Pager: [phone]

## 2018-09-17 NOTE — Progress Notes (Signed)
She looks and feels better this afternoon. Back pain is better. Breathing back to normal. No abdominal pain, nausea or dysphagia with liquids though she has refused all PO meds today. Has had about 4oz of protein in addition to water.   Sitting up on edge of bed. Rpt hgb 6 hours after am draw was 10.8 (0.6pt decrease), WBC 18.5 (up from 13 this am), PLT 291 (from 267). CXR with very low lung volumes but no appreciable effusion or other acute issue. She is off oxygen and appears comfortable. Unlabored respirations. T 98.9, HR 81, RR 18, BP 151/85, 96% on room air. UOP 300 + one void so far today.   Continue current measures. Leukocytosis likely reactive given clinical improvement. Recheck labs in AM. Given drop in hgb I will continue to hold lovenox for now.

## 2018-09-17 NOTE — Progress Notes (Signed)
S: Reports bilateral mid-back pain which is excruciating. Denies abdominal pain. Denies nausea or dysphagia, denies shoulder pain. Shortness of breath this morning after getting up to the bathroom.   Vitals, labs, intake/output, and orders reviewed at this time. Afebrile. HR 64-111 (single reading). Last hr 103. BP normal to hypertensive, last 135/59. Sat 100% on nasal cannula.  CMP- bicarb 18 (26 preop), glucose 150, otherwise unremarkable CBC- WBC 13.2 (7.3), Hgb 11.4 (14.4), Plt 267 (264) PO 300, UOP 850 + one void EKG- reviewed- sinus  Gen: A&Ox3, mildly tachypneic H&N: EOMI, atraumatic, neck supple Chest: breath sounds are quiet and clear bilaterally without wheezing or rhonchi Abd: soft, not tender, nondistended, incisions c/d/i without cellulitis or hematoma, evolving ecchymosis at subxyphoid incision Ext: warm, no edema, no calf tenderness. SCDs on and running.   Neuro: grossly normal  Lines/tubes/drains: PIV  A/P:  POD 1 sleeve gastrectomy Continue liquids, protein shakes as tolerated Ambulate as able  Back pain- has had back surgery and issues with back pain preop, may be exacerbated by OR table positioning, continue supportive care and multimodal analgesia, can try kpad  Acute blood loss anemia- HGB down 3 pts. Hold lovenox this morning (did not receive dose last pm either). Recheck cbc later this morning  Shortness of breath- Chest xray pending. Duoneb now. Pulmonary toilet. EKG ok. History of breathing difficulty preop which was mild and exertional, had PFTs last year w PCP and symptoms felt to be obesity related. Quit smoking in March.    Currently no plans for discharge given above issues.   Romana Juniper, MD [facility], [place] Pager [phone]

## 2018-09-17 NOTE — Progress Notes (Signed)
Patient reports hard time breathing after assisting to the bathroom, patient was assisted back to the bed , patient was put on O2 at 2L, vital signs was also checked. RN noticed fluctuating heart rate, EKG done and relay to Rapid response nurse.  Rapid response nurse to come to the floor to assess the pt. We will continue and monitor.

## 2018-09-17 NOTE — Progress Notes (Signed)
Patient refused all oral meds this am, stating that she doesn't feel like she can handle swallowing pills right now

## 2018-09-17 NOTE — Significant Event (Signed)
Rapid Response Event Note  Overview: Time Called: 0515 Arrival Time: 0520 Event Type: Cardiac, MEWS, Respiratory  Initial Focused Assessment: Called by bedside RN for patient complaining of shortness of breath ("loss of breath"), intermittent tachycardia, intermittent lightheadedness and dizziness, drowsiness, and surgical pain.  Lungs sound clear, diminished upon auscultation. Heart sounds irregular, no adventitious sounds. Bowel sounds audible.  Upon my assessment patient is asleep in bed, responds to voice. During conversation patient is closing her eyes, but still answering questions and appears to begin to fall asleep during conversation. Last narcotic given at 1731. Patient received Tramadol x1 for pain overnight.  RN informed me that patient's lips appeared to turn blue earlier during ambulation. I attempted to get orthostatic vital signs, but patient was unable to stand for vital signs to be checks. Documentation of patient's vital signs while lying and sitting are charted in flow sheet with minimal change noted during repositioning. Patient does become tachycardic (up to 120 bpm) when changing positions, but recovers quickly once back to sitting or lying down. No discoloration of lips noted while in room with patient.  Patient describes her pain when repositioning to sitting as being on her sides, then with standing states her pain is in her abdomen. Facial grimacing noted.   Interventions: Patient currently on continuous pulse oximetry monitoring.   Primary RN notified attending on call in regards to patient's drowsiness, patient's intermittent shortness of breath, and sensations of dizziness and lightheadedness with mobility.   Plan of Care (if not transferred): RN to monitor patient for improvement in alertness. If patient drowsiness sustains or patient is unable to be aroused by voice, then RN to notify rapid response or attending MD.   RN or other qualified staff member to  remain at patient's side and assist patient when ambulating.   RN to monitor patient for bowel sounds.  MD ordered for CXR to be completed now, RN to follow-up with results of exam.  Event Summary:   Charlotte Hobbs

## 2018-09-17 NOTE — Progress Notes (Signed)
Patient sleepy awakes when named called, answers questions appropriately, Post op day 1.  Provided support and encouragement.  Encouraged pulmonary toilet (flutter valve at bedside), ambulation with staff due to earlier events, and small sips of bari clear liquids.  Await CXR results/CBC.  Questions answered.  Will continue to monitor.

## 2018-09-18 LAB — CBC WITH DIFFERENTIAL/PLATELET
Abs Immature Granulocytes: 0.08 10*3/uL — ABNORMAL HIGH (ref 0.00–0.07)
Basophils Absolute: 0 10*3/uL (ref 0.0–0.1)
Basophils Relative: 0 %
Eosinophils Absolute: 0 10*3/uL (ref 0.0–0.5)
Eosinophils Relative: 0 %
HCT: 27.9 % — ABNORMAL LOW (ref 36.0–46.0)
Hemoglobin: 9.1 g/dL — ABNORMAL LOW (ref 12.0–15.0)
Immature Granulocytes: 1 %
Lymphocytes Relative: 19 %
Lymphs Abs: 2.3 10*3/uL (ref 0.7–4.0)
MCH: 31.7 pg (ref 26.0–34.0)
MCHC: 32.6 g/dL (ref 30.0–36.0)
MCV: 97.2 fL (ref 80.0–100.0)
Monocytes Absolute: 1.2 10*3/uL — ABNORMAL HIGH (ref 0.1–1.0)
Monocytes Relative: 10 %
Neutro Abs: 8.7 10*3/uL — ABNORMAL HIGH (ref 1.7–7.7)
Neutrophils Relative %: 70 %
Platelets: 183 10*3/uL (ref 150–400)
RBC: 2.87 MIL/uL — ABNORMAL LOW (ref 3.87–5.11)
RDW: 13.5 % (ref 11.5–15.5)
WBC: 12.3 10*3/uL — ABNORMAL HIGH (ref 4.0–10.5)
nRBC: 0 % (ref 0.0–0.2)

## 2018-09-18 LAB — BASIC METABOLIC PANEL
Anion gap: 9 (ref 5–15)
BUN: 15 mg/dL (ref 6–20)
CO2: 22 mmol/L (ref 22–32)
Calcium: 8 mg/dL — ABNORMAL LOW (ref 8.9–10.3)
Chloride: 109 mmol/L (ref 98–111)
Creatinine, Ser: 0.69 mg/dL (ref 0.44–1.00)
GFR calc Af Amer: >60 mL/min (ref >60)
GFR calc non Af Amer: >60 mL/min (ref >60)
Glucose, Bld: 110 mg/dL — ABNORMAL HIGH (ref 70–99)
Potassium: 4 mmol/L (ref 3.5–5.1)
Sodium: 140 mmol/L (ref 135–145)

## 2018-09-18 LAB — PROTIME-INR
INR: 1.2 (ref 0.8–1.2)
Prothrombin Time: 15.2 seconds (ref 11.4–15.2)

## 2018-09-18 LAB — APTT: aPTT: 28 seconds (ref 24–36)

## 2018-09-18 LAB — PHOSPHORUS: Phosphorus: 3 mg/dL (ref 2.5–4.6)

## 2018-09-18 LAB — PREPARE RBC (CROSSMATCH)

## 2018-09-18 LAB — HEMOGLOBIN AND HEMATOCRIT, BLOOD
HCT: 23.8 % — ABNORMAL LOW (ref 36.0–46.0)
Hemoglobin: 8 g/dL — ABNORMAL LOW (ref 12.0–15.0)

## 2018-09-18 LAB — GLUCOSE, CAPILLARY: Glucose-Capillary: 97 mg/dL (ref 70–99)

## 2018-09-18 LAB — MAGNESIUM: Magnesium: 1.8 mg/dL (ref 1.7–2.4)

## 2018-09-18 MED ORDER — SODIUM CHLORIDE 0.9% IV SOLUTION: Freq: Once | INTRAVENOUS | Status: DC

## 2018-09-18 MED ORDER — ONDANSETRON 4 MG PO TBDP: 4.0000 mg | ORAL_TABLET | Freq: Four times a day (QID) | ORAL | 0 refills | Status: DC | PRN

## 2018-09-18 MED ORDER — PANTOPRAZOLE SODIUM 40 MG PO TBEC: 40.0000 mg | DELAYED_RELEASE_TABLET | Freq: Every day | ORAL | 0 refills | Status: DC

## 2018-09-18 MED ORDER — GABAPENTIN 100 MG PO CAPS: 200.0000 mg | ORAL_CAPSULE | Freq: Two times a day (BID) | ORAL | 0 refills | Status: DC

## 2018-09-18 MED ORDER — ACETAMINOPHEN 500 MG PO TABS: 1000.0000 mg | ORAL_TABLET | Freq: Three times a day (TID) | ORAL | 0 refills | Status: AC

## 2018-09-18 MED ORDER — DOCUSATE SODIUM 100 MG PO CAPS: 100.0000 mg | ORAL_CAPSULE | Freq: Two times a day (BID) | ORAL | 0 refills | Status: DC

## 2018-09-18 MED ORDER — TRAMADOL HCL 50 MG PO TABS: 50.0000 mg | ORAL_TABLET | Freq: Four times a day (QID) | ORAL | 0 refills | Status: DC | PRN

## 2018-09-18 MED ORDER — IPRATROPIUM-ALBUTEROL 0.5-2.5 (3) MG/3ML IN SOLN
3.0000 mL | Freq: Four times a day (QID) | RESPIRATORY_TRACT | Status: DC
  Administered 2018-09-18 – 2018-09-19 (×3): 3 mL via RESPIRATORY_TRACT
  Filled 2018-09-18 (×3): qty 3

## 2018-09-18 MED FILL — ONDANSETRON ODT 4 MG TABLET: 4 | 5 days supply | Qty: 20 | Fill #0

## 2018-09-18 MED FILL — traMADol HCL 50 MG TABS: 50 | 2 days supply | Qty: 10 | Fill #0

## 2018-09-18 MED FILL — PANTOPRAZOLE SOD DR 40 MG T: 40 | 90 days supply | Qty: 90 | Fill #0

## 2018-09-18 MED FILL — GABAPENTIN 100 MG CAPSULE: 100 | 5 days supply | Qty: 20 | Fill #0

## 2018-09-18 NOTE — Progress Notes (Signed)
Notified Dr Kae Heller of [place].  Discussed current vital signs.  Orders received.

## 2018-09-18 NOTE — Progress Notes (Signed)
Patient sleeping but awakened easily, Post op day 2.  Provided support and encouragement.  Encouraged pulmonary toilet, ambulation and small sips of liquids. We discussed goals walking in the hallway x2 this morning and sitting in the chair. All questions answered.  Will continue to monitor.

## 2018-09-18 NOTE — Progress Notes (Addendum)
Discussed with patient's nurse; HR improved this afternoon and patient seems to be doing better, however given continued downward trend in hgb will transfuse 2u PRBC. Coags normal. PO intake recorded only 60cc, but by report has had 4oz protein and 4oz water. Has taken essentially no meds today for pain or nausea (or otherwise) including scheduled tylenol/gabapentin; did take her beta blocker. Recheck H&H and reassess progress in AM.

## 2018-09-18 NOTE — Progress Notes (Signed)
S: Feels better this morning. Slept well last night. Tolerating liquids. Notes some ongoing back pain but this is better. Denies chest pain, pleuritic pain, or feeling short of breath. Denies nausea or reflux, no abdominal pain  Vitals, labs, intake/output, and orders reviewed at this time. Afebrile. HR 80s-low 100s overnight. This morning when I walked in her HR was 86 at rest. Normo- to hypertensive, sats mid 90s on room air  PO 480, UOP 1050 + 1void BMP/Mg/Phos unremarkable; bicarb 22 WBC down to 12.3 (18.5), HGB 9.1 (10.8), PLT 183 (291)- partially dilutional  Gen: A&Ox3, no distress  H&N: EOMI, atraumatic, neck supple Chest: unlabored respirations, bilateral expiratory wheeze, RRR Abd: soft, appropriately tender, nondistended, incisions c/d/i with steris, no cellultis or hematoma Ext: warm, no edema Neuro: grossly normal  Lines/tubes/drains: PIV  A/P:  POD 2 sleeve gastrectomy Continue liquids, protein shakes as tolerated Ambulate as able  Back pain- has had back surgery and issues with back pain preop, may be exacerbated by OR table positioning, continue supportive care and multimodal analgesia, can try kpad. This is improving.   Acute blood loss anemia- HGB down slightly. Doubt ongoing bleeding as her whole CbC is down suggesting partially dilutional drop.   Shortness of breath- improving, sats have been fine off oxygen. Continue nebs and pulmonary toilet.   History of breathing difficulty preop which was mild and exertional, had PFTs last year w PCP and symptoms felt to be obesity related. Quit smoking in March.    Tachycardia- intermittent, she has not been taking her propranolol  Continue supportive care. Potential DC this afternoon if continuing to improve.   Romana Juniper, MD [facility], [place] Pager [phone]

## 2018-09-19 LAB — BPAM RBC
Blood Product Expiration Date: 202009252359
Blood Product Expiration Date: 202009252359
ISSUE DATE / TIME: 202009021627
ISSUE DATE / TIME: 202009022022
Unit Type and Rh: 6200
Unit Type and Rh: 6200

## 2018-09-19 LAB — HEMOGLOBIN AND HEMATOCRIT, BLOOD
HCT: 27.7 % — ABNORMAL LOW (ref 36.0–46.0)
HCT: 29.2 % — ABNORMAL LOW (ref 36.0–46.0)
Hemoglobin: 9.3 g/dL — ABNORMAL LOW (ref 12.0–15.0)
Hemoglobin: 9.7 g/dL — ABNORMAL LOW (ref 12.0–15.0)

## 2018-09-19 LAB — TYPE AND SCREEN
ABO/RH(D): A POS
Antibody Screen: NEGATIVE
Unit division: 0
Unit division: 0

## 2018-09-19 NOTE — TOC Benefit Eligibility Note (Signed)
Transition of Care [facility]) Benefit Eligibility Note    Patient Details  Name: KARINE GARN MRN: [id number] Date of Birth: Oct 02, 1970   Medication/Dose: 40 mg [place] q 12hrs  Covered?: Yes  Tier: 3 Drug  Prescription Coverage Preferred Pharmacy: [place] with Person/Company/Phone Number:: Jess/ [employer] [phone]  Co-Pay: $214.00  Prior Approval: No  Deductible: Met       Kerin Salen Phone Number: 09/19/2018, 9:21 AM

## 2018-09-19 NOTE — Progress Notes (Signed)
Patient alert and oriented, Post op day 3.  Provided support and encouragement.  Encouraged  ambulation and small sips of liquids.  Sitting on side of bed, drinking on fluids.  Denies pain/nausea.  Using flutter valve doesn't like because causes cough.  Discussed importance of pulmonary toilet.  All questions answered.  Will continue to monitor.

## 2018-09-19 NOTE — Progress Notes (Signed)

## 2018-09-19 NOTE — Progress Notes (Signed)
S: She feels great this morning, feels ready to go home.  Tolerating liquids.  Not really having abdominal pain except when she has a cough.  No more shortness of breath.  Vitals, labs, intake/output, and orders reviewed at this time.  Afebrile. HR 68-87.  Normotensive. PO 300, UOP 2800 BMP/Mg/Phos unremarkable; bicarb 22 Hemoglobin 9.3 this morning, from 8 yesterday afternoon, after 2 units of blood  Gen: A&Ox3, no distress  H&N: EOMI, atraumatic, neck supple Chest: unlabored respirations, bilateral expiratory wheeze, RRR Abd: soft, appropriately tender, nondistended, incisions c/d/i with steris, no cellultis or hematoma Ext: warm, no edema Neuro: grossly normal  Lines/tubes/drains: PIV  A/P:  POD 3 sleeve gastrectomy Continue liquids, protein shakes as tolerated Ambulate as able  Back pain- she was to be more or less resolved.   Acute blood loss anemia- HGB down slightly. Her vital signs are much better, her response to transfusion was not as robust as I would expect.  We'll recheck labs this afternoon.     Shortness of breath- resolved Continue nebs and pulmonary toilet.   History of breathing difficulty preop which was mild and exertional, had PFTs last year w PCP and symptoms felt to be obesity related. Quit smoking in March.    Tachycardia- resolved  Continue supportive care. Potential DC this afternoon if continuing to improve.   Romana Juniper, MD [facility] Surgery, [place] Pager [phone]

## 2018-09-19 NOTE — Plan of Care (Signed)
Patient sitting edge of bed this morning; pain and nausea well controlled at this time. No needs expressed. Will continue to monitor.

## 2018-09-19 NOTE — Discharge Summary (Signed)
Physician Discharge Summary  Charlotte Hobbs [id number] DOB: January 20, 1970 DOA: 09/16/2018  PCP: Hali Marry, MD  Admit date: 09/16/2018 Discharge date: 09/19/2018   Recommendations for Outpatient Follow-up:   Follow-up Information    Clovis Riley, MD. Go on 10/03/2018.   Specialty: General Surgery Why: at 1020 Contact information: [address] [place] [address] [phone]        Clovis Riley, MD .   Specialty: General Surgery Contact information: [address] [place] [address] [phone]          Discharge Diagnoses:  Active Problems:   Morbid obesity ([place])   Surgical Procedure: Laparoscopic Sleeve Gastrectomy, upper endoscopy  Discharge Condition: Good Disposition: Home  Diet recommendation: Postoperative sleeve gastrectomy diet (liquids only)  Filed Weights   09/16/18 1054  Weight: 118.1 kg     Hospital Course:  The patient was admitted for a planned laparoscopic sleeve gastrectomy. Please see operative note. Preoperatively the patient was given lovenox for DVT prophylaxis. Postoperative prophylactic Lovenox was held due to decreasing hemoglobin. ERAS protocol was used. On the evening of postoperative day 0, the patient was started on water and ice chips. On postoperative day 1 the patient had no fever and was tolerating water in their diet was gradually advanced throughout the day. She did however have issues with shortness of breath and intermittent mild tachycardia.  Chest x-ray, EKG and repeat labs are performed, which demonstrated a slowly decreasing hemoglobin.  She worked aggressively with pulmonary toilet with improvement in her respiratory symptoms.  However her hemoglobin continued to decrease on postoperative day 2, and she was given 2 units of blood.  On postop day 3, hemoglobin was stable.  She was feeling very well, tolerating liquids, and was at her baseline. The patient was  ambulating without difficulty. Their vital signs are stable without fever or tachycardia. The patient had received discharge instructions and counseling. They were deemed stable for discharge and had met discharge criteria.  Of note, the patient did not receive any Lovenox other than her preoperative dose due to concerns of bleeding with falling hemoglobin postoperatively from a preoperative high of 14 to a nadir of 8. Her calculated predicted probability of VTE the next 30 days is at least 0.48%, and the standard recommendation would be to keep her on prophylactic Lovenox for 2 weeks.  However, given the issues throughout her hospitalization, and weighing the risk of bleeding/ongoing acute blood loss anemia against the risk of thromboembolic complications, Lovenox will not be prescribed.   Discharge Instructions  Discharge Instructions    Ambulate hourly while awake   Complete by: As directed    Ambulate hourly while awake   Complete by: As directed    Call MD for:  difficulty breathing, headache or visual disturbances   Complete by: As directed    Call MD for:  difficulty breathing, headache or visual disturbances   Complete by: As directed    Call MD for:  persistant dizziness or light-headedness   Complete by: As directed    Call MD for:  persistant dizziness or light-headedness   Complete by: As directed    Call MD for:  persistant nausea and vomiting   Complete by: As directed    Call MD for:  persistant nausea and vomiting   Complete by: As directed    Call MD for:  redness, tenderness, or signs of infection (pain, swelling, redness, odor or green/yellow discharge around incision site)  Complete by: As directed    Call MD for:  redness, tenderness, or signs of infection (pain, swelling, redness, odor or green/yellow discharge around incision site)   Complete by: As directed    Call MD for:  severe uncontrolled pain   Complete by: As directed    Call MD for:  severe uncontrolled  pain   Complete by: As directed    Call MD for:  temperature >101 F   Complete by: As directed    Call MD for:  temperature >101 F   Complete by: As directed    Diet bariatric full liquid   Complete by: As directed    Incentive spirometry   Complete by: As directed    Perform hourly while awake   Incentive spirometry   Complete by: As directed    Perform hourly while awake     Allergies as of 09/19/2018   No Known Allergies     Medication List    STOP taking these medications   metFORMIN 500 MG 24 hr tablet Commonly known as: GLUCOPHAGE-XR     TAKE these medications   acetaminophen 500 MG tablet Commonly known as: TYLENOL Take 2 tablets (1,000 mg total) by mouth every 8 (eight) hours for 5 days.   buPROPion 300 MG 24 hr tablet Commonly known as: WELLBUTRIN XL Take 300 mg by mouth daily.   docusate sodium 100 MG capsule Commonly known as: COLACE Take 1 capsule (100 mg total) by mouth 2 (two) times daily.   gabapentin 100 MG capsule Commonly known as: NEURONTIN Take 2 capsules (200 mg total) by mouth every 12 (twelve) hours.   lamoTRIgine 200 MG tablet Commonly known as: LAMICTAL Take 200 mg by mouth 2 (two) times daily.   ondansetron 4 MG disintegrating tablet Commonly known as: ZOFRAN-ODT Take 1 tablet (4 mg total) by mouth every 6 (six) hours as needed for nausea or vomiting.   pantoprazole 40 MG tablet Commonly known as: PROTONIX Take 1 tablet (40 mg total) by mouth daily.   propranolol 20 MG tablet Commonly known as: INDERAL Take 20 mg by mouth 3 (three) times daily. Notes to patient: Monitor Blood Pressure Daily and keep a log for primary care physician.  You may need to make changes to your medications with rapid weight loss.     traMADol 50 MG tablet Commonly known as: ULTRAM Take 1 tablet (50 mg total) by mouth every 6 (six) hours as needed (pain).   Vraylar capsule Generic drug: cariprazine Take 1.5 mg by mouth daily.      Follow-up  Information    Clovis Riley, MD. Go on 10/03/2018.   Specialty: General Surgery Why: at 1020 Contact information: [address] [place] [address] [phone]        Clovis Riley, MD .   Specialty: General Surgery Contact information: [address] [place] [address] [phone]            The results of significant diagnostics from this hospitalization (including imaging, microbiology, ancillary and laboratory) are listed below for reference.    Significant Diagnostic Studies: Dg Chest 2 View  Result Date: 09/17/2018 CLINICAL DATA:  Shortness of breath, back spasms EXAM: CHEST - 2 VIEW COMPARISON:  01/01/2018 FINDINGS: The heart size and mediastinal contours are within normal limits. Very low volume examination without acute airspace abnormality. The visualized skeletal structures are unremarkable. IMPRESSION: Very low volume examination without acute abnormality of the lungs. Electronically Signed  By: Eddie Candle M.D.   On: 09/17/2018 10:01    Labs: Basic Metabolic Panel: Recent Labs  Lab 09/17/18 0400 09/18/18 0345  NA 137 140  K 4.4 4.0  CL 108 109  CO2 18* 22  GLUCOSE 150* 110*  BUN 20 15  CREATININE 0.70 0.69  CALCIUM 8.3* 8.0*  MG 2.0 1.8  PHOS  --  3.0   Liver Function Tests: Recent Labs  Lab 09/17/18 0400  AST 28  ALT 39  ALKPHOS 53  BILITOT 0.7  PROT 6.2*  ALBUMIN 3.4*    CBC: Recent Labs  Lab 09/17/18 0400 09/17/18 1039 09/18/18 0345 09/18/18 1212 09/19/18 0338 09/19/18 1210  WBC 13.2* 18.5* 12.3*  --   --   --   NEUTROABS 10.8*  --  8.7*  --   --   --   HGB 11.4* 10.8* 9.1* 8.0* 9.3* 9.7*  HCT 34.4* 32.3* 27.9* 23.8* 27.7* 29.2*  MCV 97.5 94.7 97.2  --   --   --   PLT 267 291 183  --   --   --     CBG: Recent Labs  Lab 09/18/18 0433  GLUCAP 97    Active Problems:   Morbid obesity ([place])     Signed:  Clovis Riley, MD [facility] Surgery,  [place] 09/20/2018, 7:07 AM

## 2018-09-19 NOTE — Plan of Care (Signed)
Reviewed discharge instructions with patient; copy given. IV removed. Patient ready for discharge.  

## 2018-09-19 NOTE — Progress Notes (Addendum)
PHARMACY CONSULT FOR:  Risk Assessment for Post-Discharge VTE Following Bariatric Surgery  Post-Discharge VTE Risk Assessment: This patient's probability of 30-day post-discharge VTE is increased due to the factors marked:   Female    Age >/=60 years    BMI >/=50 kg/m2    CHF    Dyspnea at Rest    Paraplegia   X Non-gastric-band surgery    Operation Time >/=3 hr    Return to OR   X  Length of Stay >/= 3 d      Hx of VTE   Hypercoagulable condition   Significant venous stasis   Predicted probability of 30-day post-discharge VTE: 0.48% (updated assessment - factoring in LOS > 3 days)  Other patient-specific factors to consider: n/a   Recommendation for Discharge: Enoxaparin 40 mg [name] q12h x 2 weeks post-discharge  Charlotte [place] is a 48 y.o. female who underwent laparoscopic sleeve gastrectomy on 8/31   Case start: 1401 Case end: 1521   No Known Allergies  Patient Measurements: Height: 5\' 4"  (162.6 cm) Weight: 260 lb 6 oz (118.1 kg) IBW/kg (Calculated) : 54.7 Body mass index is 44.69 kg/m.  Recent Labs    09/17/18 0400 09/17/18 1039 09/18/18 0345 09/18/18 1212 09/18/18 1339 09/19/18 0338  WBC 13.2* 18.5* 12.3*  --   --   --   HGB 11.4* 10.8* 9.1* 8.0*  --  9.3*  HCT 34.4* 32.3* 27.9* 23.8*  --  27.7*  PLT 267 291 183  --   --   --   APTT  --   --   --   --  28  --   CREATININE 0.70  --  0.69  --   --   --   MG 2.0  --  1.8  --   --   --   PHOS  --   --  3.0  --   --   --   ALBUMIN 3.4*  --   --   --   --   --   PROT 6.2*  --   --   --   --   --   AST 28  --   --   --   --   --   ALT 39  --   --   --   --   --   ALKPHOS 53  --   --   --   --   --   BILITOT 0.7  --   --   --   --   --    Estimated Creatinine Clearance: 108.7 mL/min (by C-G formula based on SCr of 0.69 mg/dL).    Past Medical History:  Diagnosis Date  . Bipolar disorder ([place])   . Depression    bi polar  . Mania ([place])      Medications Prior to Admission  Medication Sig Dispense  Refill Last Dose  . buPROPion (WELLBUTRIN XL) 300 MG 24 hr tablet Take 300 mg by mouth daily.    09/15/2018 at Unknown time  . cariprazine (VRAYLAR) capsule Take 1.5 mg by mouth daily.    09/15/2018 at Unknown time  . lamoTRIgine (LAMICTAL) 200 MG tablet Take 200 mg by mouth 2 (two) times daily.   09/15/2018 at Unknown time  . metFORMIN (GLUCOPHAGE-XR) 500 MG 24 hr tablet Take 500 mg by mouth 2 (two) times a day.   09/14/2018  . propranolol (INDERAL) 20 MG tablet Take 20 mg by mouth 3 (three) times  daily.   09/16/2018 at 0700       Charlotte Hobbs P 09/19/2018,8:23 AM

## 2018-09-20 ENCOUNTER — Other Ambulatory Visit: Payer: Self-pay | Admitting: *Deleted

## 2018-09-20 ENCOUNTER — Encounter: Payer: Self-pay | Admitting: *Deleted

## 2018-09-20 NOTE — Patient Outreach (Signed)
[facility]) Care Management  09/20/2018  Charlotte [place] Mar 18, 1970 [id number]   Transition of care call/case closure   Referral received:  08/29/18 Initial outreach: 09/13/18 for preoperative call; 9/4 for postoperative call Surgery/procedure date: 09/16/18 Insurance: [facility] Choice Plan   Subjective: Initial successful telephone call to patient's preferred number in order to complete transition of care assessment; 2 HIPAA identifiers verified. Explained purpose of call and completed transition of care assessment.  Charlotte Hobbs states she is doing well, denies post-operative problems, says surgical incisions are unremarkable, but does report a large firm bruise on her left side, states surgical pain well managed with prescribed medications, tolerating diet, denies bowel or bladder problems. She also denies dyspnea, chest pain and fever. Daughter is assisting with her recovery.  She denies any ongoing health issues and says she/he does not need a referral to one of the [facility] chronic disease management programs.  She says she does  have the hospital indemnity and has filed the claim with [employer]. She says she uses the [facility] outpatient pharmacy.  She denies educational needs related to staying safe during the COVID 19 pandemic.    Objective:  Charlotte Hobbs had laparoscopic gastric sleeve on 09/16/18 at [facility]. Comorbidities include: lumbar herniated disc, plantar fasciitis, Bipolar Disorder, depression, morbid obesity,  He/She was discharged to home on //20 without the need for home health services or DME.   Assessment:  Patient voices good understanding of all discharge instructions.  See transition of care flowsheet for assessment details.   Plan:  Provided phone number for surgeon on call and advised Charlotte Hobbs to report the large firm bruise on her left abdominal area.  Reviewed hospital discharge diagnosis of laparoscopic gastric sleeve and treatment plan  using hospital discharge instructions, assessing medication adherence, reviewing problems requiring provider notification, and discussing the importance of follow up with surgeon as directed. Reviewed [facility]'s announcements that all [facility] members will receive the Healthy Lifestyle Premium rate in 2021.  No ongoing care management needs identified so will close case to [facility] Management services and route successful outreach letter with [place] Management pamphlet and 24 Hour Nurse Line Magnet to [facility] clinical pool to be mailed to patient's home address.   Barrington Ellison RN,CCM,CDE [place] Management Coordinator Office Phone [phone] Office Fax [phone]

## 2018-09-24 ENCOUNTER — Telehealth (HOSPITAL_COMMUNITY): Payer: Self-pay

## 2018-09-24 NOTE — Telephone Encounter (Signed)
Patient called to discuss post bariatric surgery follow up questions.  See below:   1.  Tell me about your pain and pain management?denies  2.  Let's talk about fluid intake.  How much total fluid are you taking in?40 ounes  3.  How much protein have you taken in the last 2 days?60 grams  4.  Have you had nausea?  Tell me about when have experienced nausea and what you did to help?denies  5.  Has the frequency or color changed with your urine?denies  6.  Tell me what your incisions look like?no problem 7.  Have you been passing gas? BM?yes  8.  If a problem or question were to arise who would you call?  Do you know contact numbers for [place], [facility], and [facility]?  9.  How has the walking going?yes  10.  How are your vitamins and calcium going?  How are you taking them?mvi twice a day, calcium soft chews

## 2018-10-01 ENCOUNTER — Ambulatory Visit: Payer: 59

## 2018-10-03 MED FILL — lamoTRIgine 200 MG TABS: 200 | 30 days supply | Qty: 60 | Fill #0

## 2018-10-03 MED FILL — buPROPion HCL ER (XL) 300 M: 300 | 30 days supply | Qty: 30 | Fill #0

## 2018-10-18 MED FILL — VRAYLAR 1.5 MG CAPSULE: 1.5 | 30 days supply | Qty: 30 | Fill #1

## 2018-10-31 MED FILL — PROPRANOLOL 20 MG TABLET: 20 | 22 days supply | Qty: 90 | Fill #2

## 2018-11-11 MED FILL — buPROPion HCL ER (XL) 300 M: 300 | 30 days supply | Qty: 30 | Fill #1

## 2018-11-28 MED FILL — METFORMIN HCL ER 500 MG TB2: 500 | 60 days supply | Qty: 120 | Fill #1

## 2018-11-28 MED FILL — lamoTRIgine 200 MG TABS: 200 | 30 days supply | Qty: 60 | Fill #1

## 2018-12-02 MED FILL — PROPRANOLOL 20 MG TABLET: 20 | 23 days supply | Qty: 90 | Fill #0

## 2018-12-02 MED FILL — VRAYLAR 1.5 MG CAPSULE: 1.5 | 30 days supply | Qty: 30 | Fill #0

## 2018-12-26 MED FILL — BUPROPION HCL XL 300 MG TAB: 300 | 30 days supply | Qty: 30 | Fill #0

## 2018-12-26 MED FILL — PROPRANOLOL 20 MG TABLET: 20 | 22 days supply | Qty: 90 | Fill #0

## 2019-01-08 MED FILL — lamoTRIgine 200 MG TABS: 200 | 30 days supply | Qty: 60 | Fill #0

## 2019-01-08 MED FILL — VRAYLAR 1.5 MG CAPSULE: 1.5 | 30 days supply | Qty: 30 | Fill #0

## 2019-01-15 DIAGNOSIS — F319 Bipolar disorder, unspecified: Secondary | ICD-10-CM | POA: Diagnosis not present

## 2019-01-24 MED FILL — PROPRANOLOL 20 MG TABLET: 20 | 22 days supply | Qty: 90 | Fill #1

## 2019-01-24 MED FILL — CLOBETASOL PROPIONATE 0.05: 0.05 | 30 days supply | Qty: 60 | Fill #0

## 2019-02-20 MED FILL — VRAYLAR 1.5 MG CAPSULE: 1.5 | 30 days supply | Qty: 30 | Fill #0

## 2019-02-20 MED FILL — lamoTRIgine 200 MG TABS: 200 | 30 days supply | Qty: 60 | Fill #0

## 2019-02-20 MED FILL — METFORMIN HCL ER 500 MG TB2: 500 | 30 days supply | Qty: 60 | Fill #0

## 2019-02-20 MED FILL — BUPROPION HCL ER (XL) 300 M: 300 | 30 days supply | Qty: 30 | Fill #0

## 2019-02-20 MED FILL — PROPRANOLOL 20 MG TABLET: 20 | 23 days supply | Qty: 90 | Fill #0

## 2019-03-14 MED FILL — PROPRANOLOL 20 MG TABLET: 20 | 23 days supply | Qty: 90 | Fill #1

## 2019-03-18 DIAGNOSIS — L309 Dermatitis, unspecified: Secondary | ICD-10-CM | POA: Diagnosis not present

## 2019-03-18 MED FILL — TRIAMCINOLONE 0.1% OINTMENT: 0.1 | 90 days supply | Qty: 454 | Fill #0

## 2019-03-28 MED FILL — METFORMIN HCL ER 500 MG TB2: 500 | 30 days supply | Qty: 60 | Fill #1

## 2019-03-28 MED FILL — lamoTRIgine 200 MG TABS: 200 | 30 days supply | Qty: 60 | Fill #1

## 2019-03-28 MED FILL — TRIAMCINOLONE 0.1% OINTMENT: 0.1 | 90 days supply | Qty: 454 | Fill #0

## 2019-03-28 MED FILL — BUPROPION HCL ER (XL) 300 M: 300 | 30 days supply | Qty: 30 | Fill #1

## 2019-03-28 MED FILL — VRAYLAR 1.5 MG CAPSULE: 1.5 | 30 days supply | Qty: 30 | Fill #1

## 2019-04-14 MED FILL — PROPRANOLOL 20 MG TABLET: 20 | 23 days supply | Qty: 90 | Fill #2

## 2019-04-15 DIAGNOSIS — L245 Irritant contact dermatitis due to other chemical products: Secondary | ICD-10-CM | POA: Diagnosis not present

## 2019-04-16 DIAGNOSIS — F319 Bipolar disorder, unspecified: Secondary | ICD-10-CM | POA: Diagnosis not present

## 2019-04-17 ENCOUNTER — Other Ambulatory Visit (HOSPITAL_COMMUNITY): Payer: Self-pay | Admitting: Psychiatry

## 2019-05-07 MED FILL — VRAYLAR 1.5 MG CAPSULE: 1.5 | 30 days supply | Qty: 30 | Fill #2

## 2019-05-07 MED FILL — PROPRANOLOL 20 MG TABLET: 20 | 23 days supply | Qty: 90 | Fill #3

## 2019-05-07 MED FILL — BUPROPION HCL ER (XL) 300 M: 300 | 30 days supply | Qty: 30 | Fill #2

## 2019-05-07 MED FILL — lamoTRIgine 200 MG TABS: 200 | 30 days supply | Qty: 60 | Fill #2

## 2019-05-07 MED FILL — METFORMIN HCL ER 500 MG TB2: 500 | 30 days supply | Qty: 60 | Fill #2

## 2019-05-07 MED FILL — BETAMETHASONE DP 0.05% OINT: 0.05 | 25 days supply | Qty: 45 | Fill #0

## 2019-05-08 ENCOUNTER — Encounter: Payer: 59 | Attending: Surgery | Admitting: Dietician

## 2019-05-08 ENCOUNTER — Encounter: Payer: Self-pay | Admitting: Dietician

## 2019-05-08 ENCOUNTER — Other Ambulatory Visit: Payer: Self-pay

## 2019-05-08 DIAGNOSIS — Z9884 Bariatric surgery status: Secondary | ICD-10-CM | POA: Insufficient documentation

## 2019-05-08 NOTE — Progress Notes (Signed)
Bariatric Nutrition Follow-Up Visit Medical Nutrition Therapy  Appt Start Time: 7:30am    End Time: 8:00am  8 Months Post-Operative Sleeve Gastrectomy Surgery Surgery Date: 09/16/2018  Pt's Expectations of Surgery/ Goals: to lose weight Pt Reported Successes: pain is gone, can bend down to tie shoes   NUTRITION ASSESSMENT  Anthropometrics  Start weight at [facility]: 240.3 lbs (date: 02/18/2018) Today's weight: 209.6 lbs  Body Composition Scale 05/08/19  Weight  lbs 209.6  BMI 35.8  Total Body Fat  % 41.1     Visceral Fat 13  Fat-Free Mass  % 58.8     Total Body Water  % 43.9     Muscle-Mass  lbs 31  Body Fat Displacement ---         Torso  lbs 53.3         Left Leg  lbs 10.6         Right Leg  lbs 10.6         Left Arm  lbs 5.3         Right Arm  lbs 5.3    Lifestyle & Dietary Hx States she thinks she is eating too much because she can eat an entire sandwich at one time. States something is not "clicking" in her head. Consumes sugary foods often, such as cookies and brownies. Does not chew thoroughly, states she eats really quickly so that she can get lots of food in before feeling full. States her medication makes her feel constantly hungry. Drinks fluid with meals, and does not drink water.    24-Hr Dietary Recall First Meal: 1/2 protein bar + 1/2 protein shake  Snack: cookie   Second Meal: 1/2 McDonald's chicken sandwich  Snack: -  Third Meal: shrimp + brownie  Snack: - Beverages: coffee, [employer] energy drinks (occasionally)   Estimated daily fluid intake: 32 oz Estimated daily protein intake: ~45 g Supplements: chewable bariatric MVI, chewable calcium  Current average weekly physical activity: yardwork  Post-Op Goals/ Signs/ Symptoms Using straws: yes  Drinking while eating: yes  Chewing/swallowing difficulties: no Changes in vision: no Changes to mood/headaches: no Hair loss/changes to skin/nails: no Difficulty focusing/concentrating: no Sweating:  no Dizziness/lightheadedness: no Palpitations: no  Carbonated/caffeinated beverages: yes (coffee, energy drinks)  N/V/D/C/Gas: diarrhea (almost daily)  Abdominal pain: sometimes  Dumping syndrome: yes   NUTRITION DIAGNOSIS  Overweight/obesity ([place]-3.3) related to past poor dietary habits and physical inactivity as evidenced by completed bariatric surgery and following dietary guidelines for continued weight loss and healthy nutrition status.   NUTRITION INTERVENTION Nutrition counseling (C-1) and education (E-2) to facilitate bariatric surgery goals, including: . Diet advancement to the next phase (phase 5) now including protein and all vegetables . The importance of consuming adequate calories as well as certain nutrients daily due to the body's need for essential vitamins, minerals, and fats . The importance of daily physical activity and to reach a goal of at least 150 minutes of moderate to vigorous physical activity weekly (or as directed by their physician) due to benefits such as increased musculature and improved lab values  Handouts Provided Include   Phase 5: Protein + All Vegetables   Learning Style & Readiness for Change Teaching method utilized: Visual & Auditory  Demonstrated degree of understanding via: Teach Back  Barriers to learning/adherence to lifestyle change: Contemplative Stage of Change   MONITORING & EVALUATION Dietary intake, weekly physical activity, body weight, and goals in 2 months.  Next Steps Patient is to follow-up in 2  months for 10 month post-op follow-up.

## 2019-05-08 NOTE — Patient Instructions (Signed)
Remember your goals from today:   Focus on CHEWING each bite of food very thoroughly (to applesauce consistency)   Aim for at least 64 ounces of fluid everyday, with at least half (32 ounces) being plain water  Try drinking it at the temperature you like best  Add lemon, fruit, or sugar-free flavoring  Keep a reusable water bottle with you at all times

## 2019-05-22 MED FILL — AMOXICILLIN 500 MG CAPSULE: 500 | 6 days supply | Qty: 21 | Fill #0

## 2019-06-11 MED FILL — PROPRANOLOL 20 MG TABLET: 20 | 22 days supply | Qty: 90 | Fill #0

## 2019-07-01 ENCOUNTER — Other Ambulatory Visit (HOSPITAL_COMMUNITY): Payer: Self-pay | Admitting: Psychiatry

## 2019-07-01 DIAGNOSIS — F319 Bipolar disorder, unspecified: Secondary | ICD-10-CM | POA: Diagnosis not present

## 2019-07-01 MED FILL — TOPIRAMATE 25 MG TAB: 25 | 43 days supply | Qty: 120 | Fill #0

## 2019-07-01 MED FILL — PROPRANOLOL 20 MG TABLET: 20 | 23 days supply | Qty: 90 | Fill #0

## 2019-07-03 ENCOUNTER — Encounter: Payer: Self-pay | Admitting: Dietician

## 2019-07-03 ENCOUNTER — Other Ambulatory Visit: Payer: Self-pay

## 2019-07-03 ENCOUNTER — Encounter: Payer: 59 | Attending: Surgery | Admitting: Dietician

## 2019-07-03 DIAGNOSIS — Z9884 Bariatric surgery status: Secondary | ICD-10-CM | POA: Insufficient documentation

## 2019-07-03 NOTE — Progress Notes (Signed)
Bariatric Nutrition Follow-Up Visit Medical Nutrition Therapy  Appt Start Time: 7:30am    End Time: 8:00am  10 Months Post-Operative Sleeve Gastrectomy Surgery Surgery Date: 09/16/2018  Pt's Expectations of Surgery/ Goals: to lose weight Pt Reported Successes: pain is gone, can bend down to tie shoes   NUTRITION ASSESSMENT  Anthropometrics  Start weight at [facility]: 240.3 lbs (date: 02/18/2018) Today's weight: 211.4 lbs  Body Composition Scale 05/08/19 07/03/19  Weight  lbs 209.6 211.4  BMI 35.8 36.3  Total Body Fat  % 41.1 -     Visceral Fat 13 -  Fat-Free Mass  % 58.8 -     Total Body Water  % 43.9 -     Muscle-Mass  lbs 31 -  Body Fat Displacement --- ---         Torso  lbs 53.3 -         Left Leg  lbs 10.6 -         Right Leg  lbs 10.6 -         Left Arm  lbs 5.3 -         Right Arm  lbs 5.3 -    Lifestyle & Dietary Hx States she has stopped eating carbs and eats primarily protein foods and some vegetables. For lunch may have chicken, [place] patty, or hamburger patty. States she is trying to get into ketosis, has been trying for about 2 weeks now. I recommended she focus on supplementing her current high protein diet with more vegetables, starchy vegetables, and fruits. States she does not want to introduce carbs into her diet and would like to continue trying to get into ketosis. States she is now drinking water (was not before), about 32 ounces per day. 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  States she is no longer taking a bariatric MVI because she did not like the chewables, so takes a generic MVI. I recommended she switch to a bariatric MVI in capsule form.   24-Hr Dietary Recall First Meal: 1 protein bar + coffee Snack: -   Second Meal: [place] patty  Snack: cheese + pepperoni  Third Meal: salad + avocado + [place] + [employer]: snack bar Beverages: water, coffee, [employer] energy drinks   Estimated daily fluid intake: 32  oz water + coffee/ energy drinks  Estimated daily protein intake: ~45 g Supplements: generic MVI, chewable calcium, B12, D Current average weekly physical activity: ADLs, on feet all day at work   [employer] Symptoms Using straws: yes  Drinking while eating: sometimes  Chewing/swallowing difficulties: no Changes in vision: no Changes to mood/headaches: no Hair loss/changes to skin/nails: mild hair loss Difficulty focusing/concentrating: no Sweating: no Dizziness/lightheadedness: no Palpitations: no  Carbonated/caffeinated beverages: yes (coffee, energy drinks)  N/V/D/C/Gas: no  Abdominal pain: sometimes  Dumping syndrome: no    NUTRITION DIAGNOSIS  Overweight/obesity ([place]-3.3) related to past poor dietary habits and physical inactivity as evidenced by completed bariatric surgery and following dietary guidelines for continued weight loss and healthy nutrition status.   NUTRITION INTERVENTION Nutrition counseling (C-1) and education (E-2) to facilitate bariatric surgery goals, including: . Diet advancement to the next phase (phase 6) now including protein, all vegetables, and fruit . The importance of consuming adequate calories as well as certain nutrients daily due to the body's need for essential vitamins, minerals, and fats . The importance of daily physical activity and to reach a goal of  at least 150 minutes of moderate to vigorous physical activity weekly (or as directed by their physician) due to benefits such as increased musculature and improved lab values  Handouts Provided Include   Phase 6: Protein + All Vegetables + Fruit  Learning Style & Readiness for Change Teaching method utilized: Visual & Auditory  Demonstrated degree of understanding via: Teach Back  Barriers to learning/adherence to lifestyle change: Contemplative Stage of Change   MONITORING & EVALUATION Dietary intake, weekly physical activity, body weight, and goals in 2 months.  Next  Steps Patient is to follow-up in 2 months for 12 month post-op follow-up.

## 2019-07-03 NOTE — Patient Instructions (Signed)
.   Continue to aim for a minimum of 64 fluid ounces daily with at least 32 ounces being plain water. . Eat non-starchy vegetables 2 times a day 7 days a week. . Per meal/snack, eat 2-3 ounces of protein first. o Add in fruit throughout the day which you may substitute starchy vegetables with (fruits and starchy vegetables are both great sources of complex carbohydrates.) o Eat fruits as long as you are able to meet your daily protein goal and still get in non-starchy vegetables 2 times per day. Whatever else you have room for may be fruit and/or starchy vegetables (potatoes, sweet potatoes, corn, and peas.)  . Continue to aim for 30 minutes of physical activity at least 5 times a week. . Remember to take 3 calcium's plus your bariatric multivitamin DAILY.  

## 2019-07-31 IMAGING — MG DIGITAL SCREENING BILATERAL MAMMOGRAM WITH CAD
5 series · 5 of 5 positions shown · non-contrast
Comparison: None.

CLINICAL DATA: Screening.

EXAM:
DIGITAL SCREENING BILATERAL MAMMOGRAM WITH CAD

[L CC (1 of 2)]
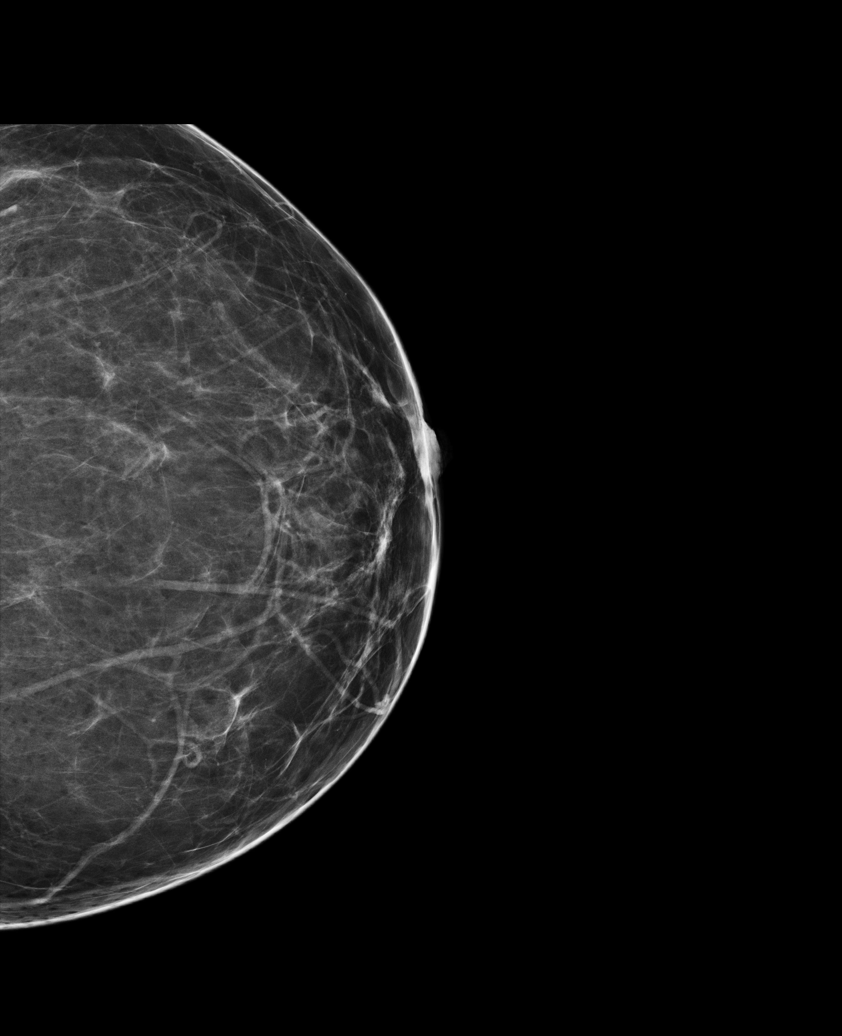

[R MLO]
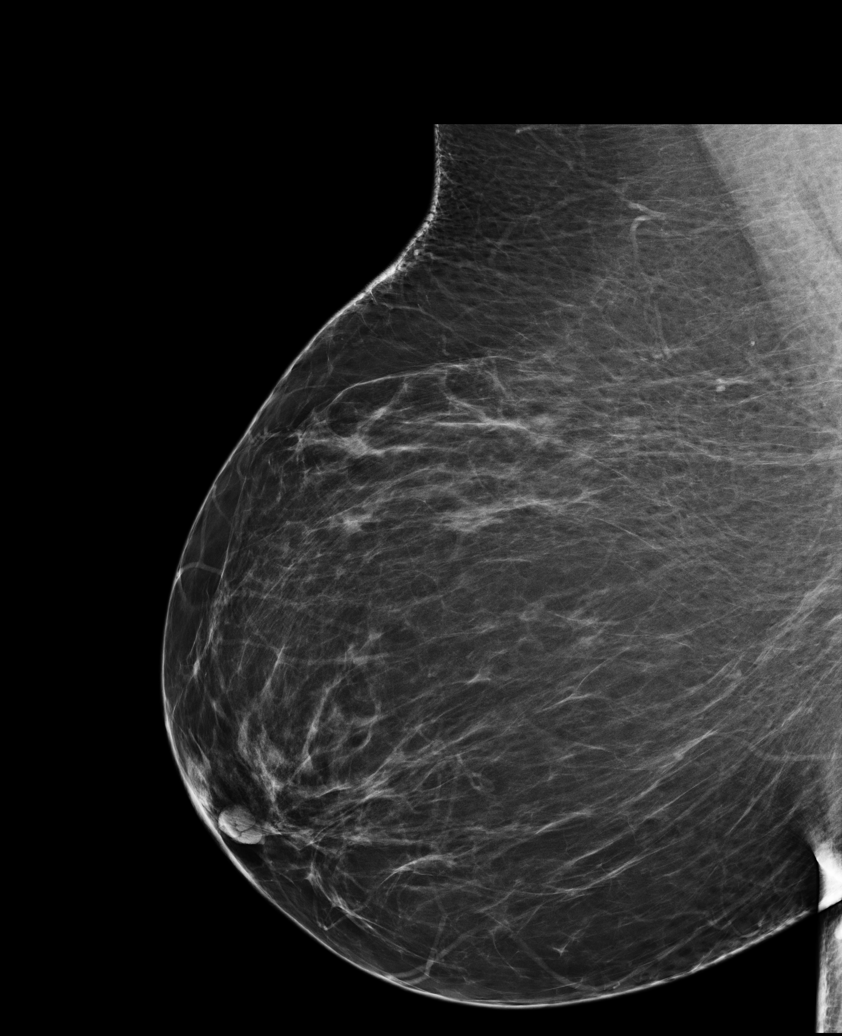

[L MLO]
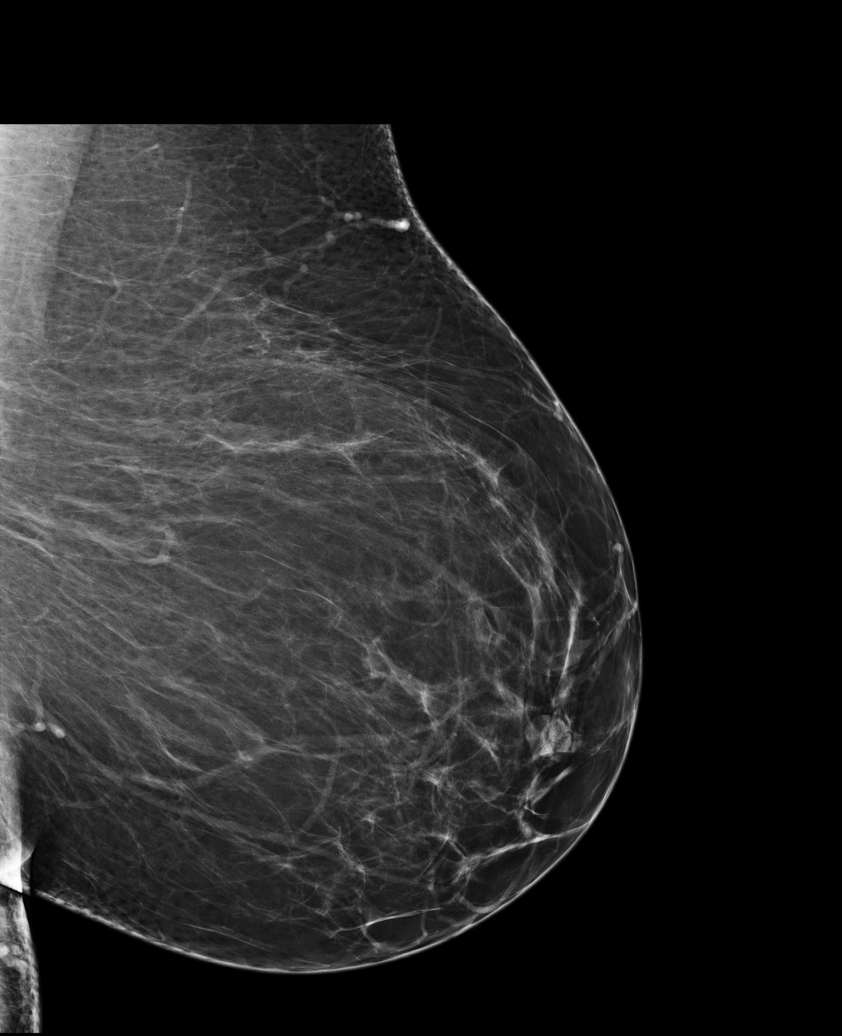

[R CC]
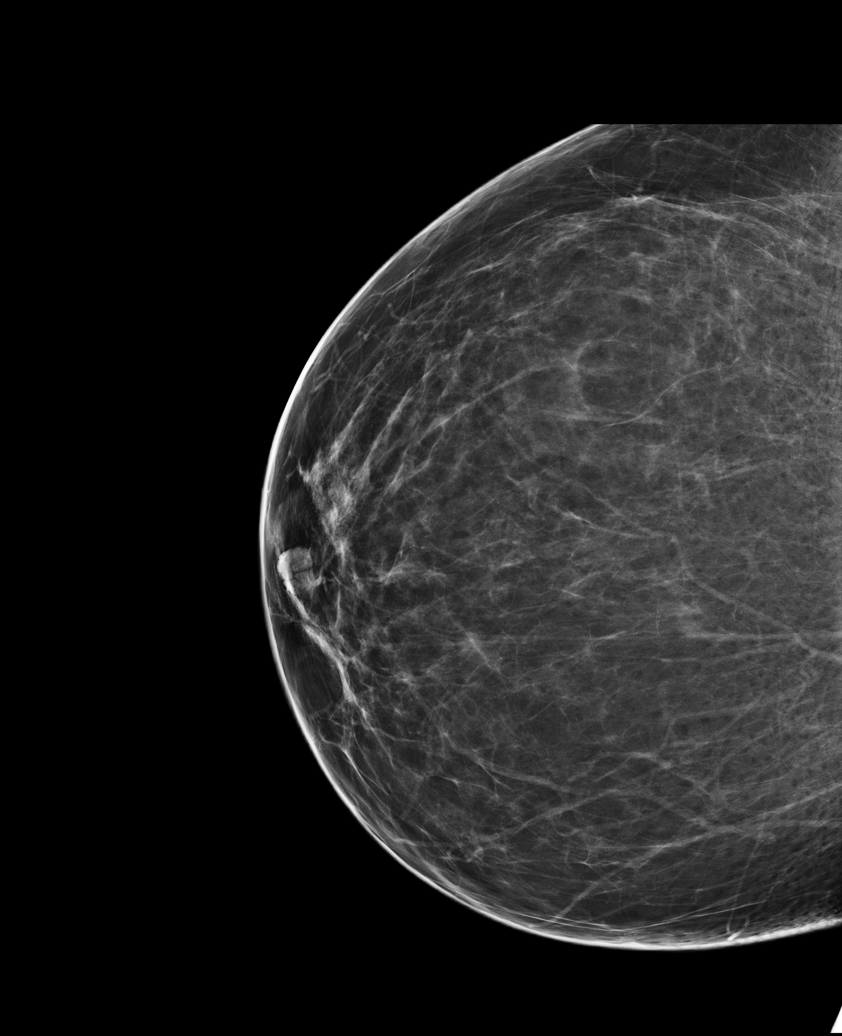

[L CC (2 of 2)]
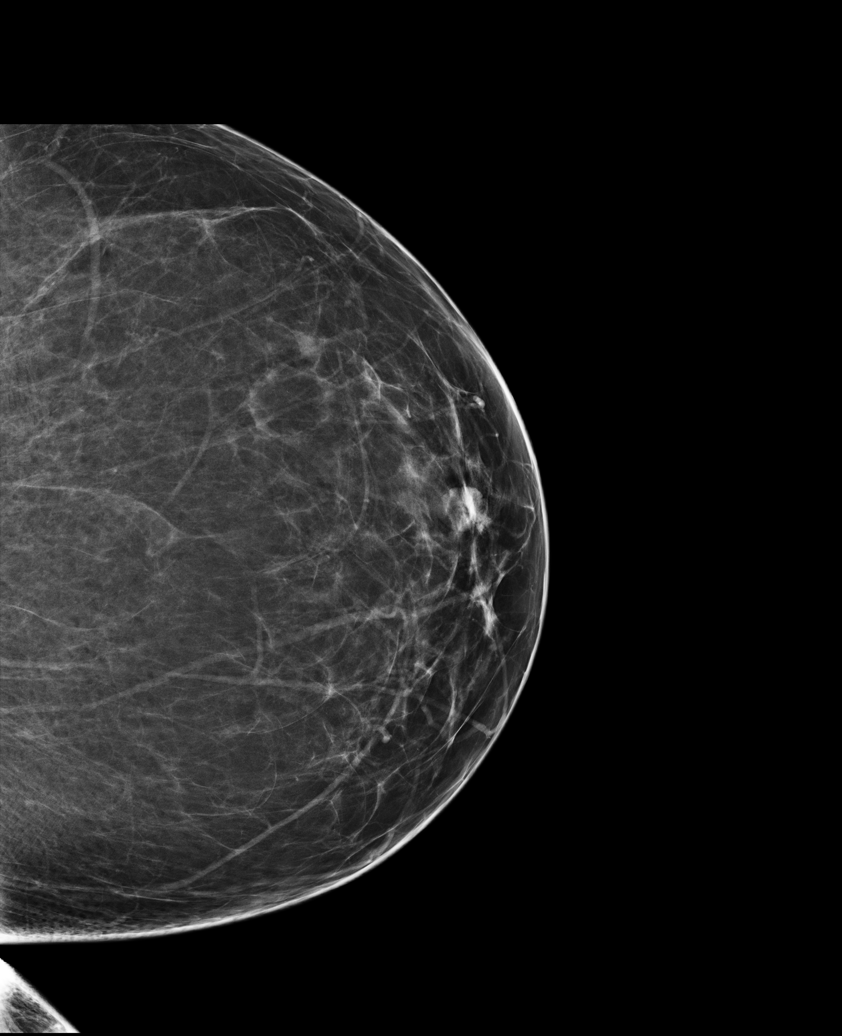

[5 of 5 positions shown; findings below may reference images not displayed]

ACR Breast Density Category b: There are scattered areas of
fibroglandular density.
FINDINGS: There are no findings suspicious for malignancy. Images were
processed with CAD.
IMPRESSION: No mammographic evidence of malignancy. A result letter of this
screening mammogram will be mailed directly to the patient.

RECOMMENDATION:
Screening mammogram in one year. (Code:[id number])

BI-RADS CATEGORY  1: Negative.

## 2019-07-31 MED FILL — VRAYLAR 1.5 MG CAPSULE: 1.5 | 30 days supply | Qty: 30 | Fill #0

## 2019-07-31 MED FILL — BUPROPION HCL ER (XL) 300 M: 300 | 30 days supply | Qty: 30 | Fill #0

## 2019-07-31 MED FILL — METFORMIN HCL ER 500 MG TB2: 500 | 30 days supply | Qty: 60 | Fill #0

## 2019-07-31 MED FILL — PROPRANOLOL 20 MG TABLET: 20 | 23 days supply | Qty: 90 | Fill #1

## 2019-07-31 MED FILL — lamoTRIgine 200 MG TABS: 200 | 30 days supply | Qty: 60 | Fill #0

## 2019-08-04 ENCOUNTER — Ambulatory Visit: Payer: 59 | Admitting: Physician Assistant

## 2019-08-14 ENCOUNTER — Encounter: Payer: Self-pay | Admitting: Family Medicine

## 2019-08-14 ENCOUNTER — Ambulatory Visit (INDEPENDENT_AMBULATORY_CARE_PROVIDER_SITE_OTHER): Payer: 59 | Admitting: Family Medicine

## 2019-08-14 VITALS — BP 133/94 | HR 87 | Ht 64.0 in | Wt 200.0 lb

## 2019-08-14 DIAGNOSIS — R109 Unspecified abdominal pain: Secondary | ICD-10-CM

## 2019-08-14 DIAGNOSIS — N920 Excessive and frequent menstruation with regular cycle: Secondary | ICD-10-CM | POA: Diagnosis not present

## 2019-08-14 NOTE — Progress Notes (Signed)
Established Patient Office Visit  Subjective:  Patient ID: Charlotte Hobbs, female    DOB: Nov 30, 1970  Age: 49 y.o. MRN: [id number]  CC:  Chief Complaint  Patient presents with  . Back Pain  . Diarrhea    HPI Charlotte Hobbs presents for gallbladder issues.  She has been having right mid back pain for approximately 3 weeks as well as some diarrhea.  The pain is worse for couple of hours after she eats and then seems to ease off.  She says [place] in particular makes it worse.  No fever, chills, or sweats.  She has not had any urinary symptoms.  No nausea or vomiting.  No significant abdominal pain.  No blood in the urine or stool.  No prior history of kidney stones.  Also wanted to let me know that on her last.  It lasted a most a week and a half and it seemed very watery and very heavy.  This was definitely unusual for her periods are typically regular she wonders if it could be menopause related now that she is 65.  Past Medical History:  Diagnosis Date  . Bipolar disorder ([place])   . Depression    bi polar  . Mania [facility])     Past Surgical History:  Procedure Laterality Date  . BACK SURGERY    . CESAREAN SECTION  2002  . LAPAROSCOPIC GASTRIC SLEEVE RESECTION N/A 09/16/2018   Procedure: LAPAROSCOPIC GASTRIC SLEEVE RESECTION, Upper Endo, ERAS Pathway;  Surgeon: Clovis Riley, MD;  Location: [facility];  Service: General;  Laterality: N/A;  . LUMBAR LAMINECTOMY/DECOMPRESSION MICRODISCECTOMY  11/13/2011   Procedure: LUMBAR LAMINECTOMY/DECOMPRESSION MICRODISCECTOMY 2 LEVELS;  Surgeon: Ophelia Charter, MD;  Location: [place] [facility];  Service: Neurosurgery;  Laterality: Right;  RIGHT Lumbar four-five Lumbar five sacral one diskectomy  . MYOMECTOMY  1996    Family History  Problem Relation Age of Onset  . Hypertension Father   . Heart attack Father 42  . Anxiety disorder Father   . Heart attack Paternal Grandfather 51  . Heart disease Maternal Grandmother     Social History    Socioeconomic History  . Marital status: Divorced    Spouse name: Not on file  . Number of children: 1  . Years of education: Not on file  . Highest education level: Not on file  Occupational History  . Occupation: Horticulturist, commercial: [employer]  Tobacco Use  . Smoking status: Former Smoker    Packs/day: 0.50    Years: 25.00    Pack years: 12.50    Types: Cigarettes    Quit date: 03/2018    Years since quitting: 1.4  . Smokeless tobacco: Never Used  Vaping Use  . Vaping Use: Never used  Substance and Sexual Activity  . Alcohol use: Not Currently    Alcohol/week: 0.0 standard drinks  . Drug use: No  . Sexual activity: Not Currently    Birth control/protection: Condom  Other Topics Concern  . Not on file  Social History Narrative   No regular exercise.  Divorced.    Social Determinants of Health   Financial Resource Strain:   . Difficulty of Paying Living Expenses:   Food Insecurity:   . Worried About Charity fundraiser in the Last Year:   . Arboriculturist in the Last Year:   Transportation Needs:   . Film/video editor (Medical):   [name] Lack of  Transportation (Non-Medical):   Physical Activity:   . Days of Exercise per Week:   . Minutes of Exercise per Session:   Stress:   . Feeling of Stress :   Social Connections:   . Frequency of Communication with Friends and Family:   . Frequency of Social Gatherings with Friends and Family:   . Attends Religious Services:   . Active Member of Clubs or Organizations:   . Attends Archivist Meetings:   [name] Marital Status:   Intimate Partner Violence:   . Fear of Current or Ex-Partner:   . Emotionally Abused:   [name] Physically Abused:   . Sexually Abused:     Outpatient Medications Prior to Visit  Medication Sig Dispense Refill  . buPROPion (WELLBUTRIN XL) 300 MG 24 hr tablet Take 300 mg by mouth daily.     . cariprazine (VRAYLAR) capsule Take 1.5 mg by mouth daily.     [name]  docusate sodium (COLACE) 100 MG capsule Take 1 capsule (100 mg total) by mouth 2 (two) times daily. 10 capsule 0  . gabapentin (NEURONTIN) 100 MG capsule Take 2 capsules (200 mg total) by mouth every 12 (twelve) hours. 20 capsule 0  . lamoTRIgine (LAMICTAL) 200 MG tablet Take 200 mg by mouth 2 (two) times daily.    . pantoprazole (PROTONIX) 40 MG tablet Take 1 tablet (40 mg total) by mouth daily. 90 tablet 0  . propranolol (INDERAL) 20 MG tablet Take 20 mg by mouth 3 (three) times daily.    . traMADol (ULTRAM) 50 MG tablet Take 1 tablet (50 mg total) by mouth every 6 (six) hours as needed (pain). 10 tablet 0  . ondansetron (ZOFRAN-ODT) 4 MG disintegrating tablet Take 1 tablet (4 mg total) by mouth every 6 (six) hours as needed for nausea or vomiting. (Patient not taking: Reported on 09/20/2018) 20 tablet 0   No facility-administered medications prior to visit.    No Known Allergies  ROS Review of Systems    Objective:    Physical Exam Vitals reviewed.  Constitutional:      Appearance: She is well-developed.  HENT:     Head: Normocephalic and atraumatic.  Eyes:     Conjunctiva/sclera: Conjunctivae normal.  Cardiovascular:     Rate and Rhythm: Normal rate and regular rhythm.     Heart sounds: Normal heart sounds.  Pulmonary:     Effort: Pulmonary effort is normal.     Breath sounds: Normal breath sounds.  Abdominal:     General: Abdomen is flat. Bowel sounds are normal.     Palpations: Abdomen is soft.  Musculoskeletal:     Comments: Nontender over the thoracic spine.  Nontender over the paraspinous muscles or ribs  Skin:    General: Skin is warm and dry.     Coloration: Skin is not pale.  Neurological:     Mental Status: She is alert and oriented to person, place, and time.  Psychiatric:        Behavior: Behavior normal.     BP (!) 133/94   Pulse 87   Ht 5\' 4"  (1.626 m)   Wt 200 lb (90.7 kg)   SpO2 100%   BMI 34.33 kg/m  Wt Readings from Last 3 Encounters:   08/14/19 200 lb (90.7 kg)  07/03/19 211 lb 6.4 oz (95.9 kg)  05/08/19 209 lb 9.6 oz (95.1 kg)     Health Maintenance Due  Topic Date Due  . Hepatitis C Screening  Never done  .  HIV Screening  Never done  . TETANUS/TDAP  07/17/2019    There are no preventive care reminders to display for this patient.  Lab Results  Component Value Date   TSH 0.90 01/01/2018   Lab Results  Component Value Date   WBC 12.3 (H) 09/18/2018   HGB 9.7 (L) 09/19/2018   HCT 29.2 (L) 09/19/2018   MCV 97.2 09/18/2018   PLT 183 09/18/2018   Lab Results  Component Value Date   NA 140 09/18/2018   K 4.0 09/18/2018   CO2 22 09/18/2018   GLUCOSE 110 (H) 09/18/2018   BUN 15 09/18/2018   CREATININE 0.69 09/18/2018   BILITOT 0.7 09/17/2018   ALKPHOS 53 09/17/2018   AST 28 09/17/2018   ALT 39 09/17/2018   PROT 6.2 (L) 09/17/2018   ALBUMIN 3.4 (L) 09/17/2018   CALCIUM 8.0 (L) 09/18/2018   ANIONGAP 9 09/18/2018   Lab Results  Component Value Date   CHOL 174 11/17/2013   Lab Results  Component Value Date   HDL 46 11/17/2013   Lab Results  Component Value Date   LDLCALC 107 (H) 11/17/2013   Lab Results  Component Value Date   TRIG 107 11/17/2013   Lab Results  Component Value Date   CHOLHDL 3.8 11/17/2013   Lab Results  Component Value Date   HGBA1C 5.1 01/01/2018      Assessment & Plan:   Problem List Items Addressed This Visit    None    Visit Diagnoses    Right flank pain    -  Primary   Relevant Orders   US Abdomen Complete   Urinalysis, Routine w reflex microscopic   CBC with Differential/Platelet   COMPLETE METABOLIC PANEL WITH GFR   Menorrhagia with regular cycle         Right flank pain just below the scapula-seems to be aggravated by eating so it does sound somewhat consistent with gallbladder even though the location of her pain is not typical.  We will go ahead and order ultrasound for further work-up and evaluation in addition to CBC, CMP to evaluate liver  enzymes, and urinalysis to evaluate for possible blood.  No prior history of kidney stones.  Avoid greasy or oily foods that would likely aggravate her symptoms.  Menorrhagia-we discussed option of work-up with STD testing and possibly pelvic ultrasound.  For now we will hold off and wait to see if her next period is normal or if it happens again.  If she starts developing pelvic pain or new symptoms such as abnormal discharge etc. then please let us know sooner rather than later.   No orders of the defined types were placed in this encounter.   Follow-up: Return if symptoms worsen or fail to improve.    Beatrice Lecher, MD

## 2019-08-15 ENCOUNTER — Ambulatory Visit (INDEPENDENT_AMBULATORY_CARE_PROVIDER_SITE_OTHER): Payer: 59

## 2019-08-15 ENCOUNTER — Other Ambulatory Visit: Payer: Self-pay

## 2019-08-15 DIAGNOSIS — R109 Unspecified abdominal pain: Secondary | ICD-10-CM | POA: Diagnosis not present

## 2019-08-15 DIAGNOSIS — K7689 Other specified diseases of liver: Secondary | ICD-10-CM | POA: Diagnosis not present

## 2019-08-15 LAB — CBC WITH DIFFERENTIAL/PLATELET
Absolute Monocytes: 455 cells/uL (ref 200–950)
Basophils Absolute: 9 cells/uL (ref 0–200)
Basophils Relative: 0.2 %
Eosinophils Absolute: 51 cells/uL (ref 15–500)
Eosinophils Relative: 1.1 %
HCT: 41.2 % (ref 35.0–45.0)
Hemoglobin: 14.3 g/dL (ref 11.7–15.5)
Lymphs Abs: 1380 cells/uL (ref 850–3900)
MCH: 32.5 pg (ref 27.0–33.0)
MCHC: 34.7 g/dL (ref 32.0–36.0)
MCV: 93.6 fL (ref 80.0–100.0)
MPV: 10.3 fL (ref 7.5–12.5)
Monocytes Relative: 9.9 %
Neutro Abs: 2705 cells/uL (ref 1500–7800)
Neutrophils Relative %: 58.8 %
Platelets: 237 10*3/uL (ref 140–400)
RBC: 4.4 10*6/uL (ref 3.80–5.10)
RDW: 13 % (ref 11.0–15.0)
Total Lymphocyte: 30 %
WBC: 4.6 10*3/uL (ref 3.8–10.8)

## 2019-08-15 LAB — COMPLETE METABOLIC PANEL WITH GFR
AG Ratio: 2 (calc) (ref 1.0–2.5)
ALT: 16 U/L (ref 6–29)
AST: 13 U/L (ref 10–35)
Albumin: 4.1 g/dL (ref 3.6–5.1)
Alkaline phosphatase (APISO): 52 U/L (ref 31–125)
BUN: 12 mg/dL (ref 7–25)
CO2: 25 mmol/L (ref 20–32)
Calcium: 8.7 mg/dL (ref 8.6–10.2)
Chloride: 110 mmol/L (ref 98–110)
Creat: 0.93 mg/dL (ref 0.50–1.10)
GFR, Est African American: 84 mL/min/{1.73_m2} (ref >=60)
GFR, Est Non African American: 72 mL/min/{1.73_m2} (ref >=60)
Globulin: 2.1 g/dL (calc) (ref 1.9–3.7)
Glucose, Bld: 101 mg/dL (ref 65–139)
Potassium: 3.7 mmol/L (ref 3.5–5.3)
Sodium: 142 mmol/L (ref 135–146)
Total Bilirubin: 0.4 mg/dL (ref 0.2–1.2)
Total Protein: 6.2 g/dL (ref 6.1–8.1)

## 2019-08-15 LAB — URINALYSIS, ROUTINE W REFLEX MICROSCOPIC
Bacteria, UA: NONE SEEN /HPF
Bilirubin Urine: NEGATIVE
Glucose, UA: NEGATIVE
Hyaline Cast: NONE SEEN /LPF
Ketones, ur: NEGATIVE
Leukocytes,Ua: NEGATIVE
Nitrite: NEGATIVE
Protein, ur: NEGATIVE
Specific Gravity, Urine: 1.022 (ref 1.001–1.03)
pH: 5.5 (ref 5.0–8.0)

## 2019-08-27 MED FILL — PROPRANOLOL 20 MG TABLET: 20 | 23 days supply | Qty: 90 | Fill #2

## 2019-09-09 MED FILL — buPROPion HCL ER (XL) 300 M: 300 | 30 days supply | Qty: 30 | Fill #0

## 2019-09-19 MED FILL — lamoTRIgine 200 MG TABS: 200 | 30 days supply | Qty: 60 | Fill #1

## 2019-09-19 MED FILL — VRAYLAR 1.5 MG CAPSULE: 1.5 | 30 days supply | Qty: 30 | Fill #1

## 2019-09-19 MED FILL — METFORMIN HCL ER 500 MG TB2: 500 | 30 days supply | Qty: 60 | Fill #1

## 2019-09-29 MED FILL — PROPRANOLOL 20 MG TABLET: 20 | 23 days supply | Qty: 90 | Fill #3

## 2019-10-23 MED FILL — BUPROPION HCL ER (XL) 300 M: 300 | 30 days supply | Qty: 30 | Fill #0

## 2019-10-30 MED FILL — lamoTRIgine 200 MG TABS: 200 | 30 days supply | Qty: 60 | Fill #2

## 2019-10-30 MED FILL — VRAYLAR 1.5 MG CAPSULE: 1.5 | 30 days supply | Qty: 30 | Fill #2

## 2019-10-30 MED FILL — METFORMIN HCL ER 500 MG TB2: 500 | 30 days supply | Qty: 60 | Fill #2

## 2019-10-30 MED FILL — PROPRANOLOL 20 MG TABLET: 20 | 23 days supply | Qty: 90 | Fill #4

## 2019-11-26 MED FILL — PROPRANOLOL 20 MG TABLET: 20 | 23 days supply | Qty: 90 | Fill #5

## 2019-12-10 MED FILL — VRAYLAR 1.5 MG CAPSULE: 1.5 | 30 days supply | Qty: 30 | Fill #3

## 2019-12-10 MED FILL — lamoTRIgine 200 MG TABS: 200 | 30 days supply | Qty: 60 | Fill #3

## 2019-12-10 MED FILL — buPROPion HCL ER (XL) 300 M: 300 | 30 days supply | Qty: 30 | Fill #1

## 2019-12-10 MED FILL — METFORMIN HCL ER 500 MG TB2: 500 | 30 days supply | Qty: 60 | Fill #3

## 2019-12-30 MED FILL — PROPRANOLOL 20 MG TABLET: 20 | 23 days supply | Qty: 90 | Fill #6

## 2020-01-05 ENCOUNTER — Other Ambulatory Visit: Payer: Self-pay

## 2020-01-05 ENCOUNTER — Ambulatory Visit (INDEPENDENT_AMBULATORY_CARE_PROVIDER_SITE_OTHER): Payer: 59 | Admitting: Physician Assistant

## 2020-01-05 VITALS — BP 139/79 | HR 74 | Ht 64.0 in | Wt 222.0 lb

## 2020-01-05 DIAGNOSIS — E6609 Other obesity due to excess calories: Secondary | ICD-10-CM | POA: Diagnosis not present

## 2020-01-05 DIAGNOSIS — Z903 Acquired absence of stomach [part of]: Secondary | ICD-10-CM

## 2020-01-05 DIAGNOSIS — Z6838 Body mass index (BMI) 38.0-38.9, adult: Secondary | ICD-10-CM | POA: Diagnosis not present

## 2020-01-06 ENCOUNTER — Encounter: Payer: Self-pay | Admitting: Physician Assistant

## 2020-01-06 DIAGNOSIS — Z903 Acquired absence of stomach [part of]: Secondary | ICD-10-CM | POA: Insufficient documentation

## 2020-01-06 MED ORDER — WEGOVY 0.25 MG/0.5ML ~~LOC~~ SOAJ
0.2500 mg | SUBCUTANEOUS | 0 refills | Status: DC
Start: 2020-01-06 — End: 2020-01-30

## 2020-01-06 NOTE — Progress Notes (Signed)
   Subjective:    Patient ID: Charlotte Hobbs, female    DOB: 1970/03/13, 49 y.o.   MRN: [id number]  HPI  Pt is a 49 yo obese female who presents to the clinic to discuss weight loss.  She has battled weight loss ever since starting medications to control her mood.  She is currently on Vraylar but gained 40 pounds.  She had gastric sleeve surgery done last year which helped her initially lose a lot of weight but she started to gain it back as well.  She does not want to change her medications because she is doing so well in the back capacity.  She admits to be hungry all the time and craving sugary foods.  She is not exercising.  She would like to try a weight loss medication.    Review of Systems  All other systems reviewed and are negative.      Objective:   Physical Exam Vitals reviewed.  Constitutional:      Appearance: Normal appearance. She is obese.  Cardiovascular:     Rate and Rhythm: Normal rate.     Pulses: Normal pulses.  Pulmonary:     Effort: Pulmonary effort is normal.  Musculoskeletal:     Right lower leg: No edema.     Left lower leg: No edema.  Neurological:     Mental Status: She is alert.  Psychiatric:        Mood and Affect: Mood normal.           Assessment & Plan:  Marland KitchenMarland KitchenShanin was seen today for obesity.  Diagnoses and all orders for this visit:  Class 2 obesity due to excess calories without serious comorbidity with body mass index (BMI) of 38.0 to 38.9 in adult -     Semaglutide-Weight Management (WEGOVY) 0.25 MG/0.5ML SOAJ; Inject 0.25 mg into the skin once a week.  Status post sleeve gastrectomy    .Marland KitchenDiscussed low carb diet with 1500 calories and 80g of protein.  Exercising at least 150 minutes a week.  My Fitness Pal could be a Microbiologist.  Discussed medication options.  Not candidate for phentermine. She is on wellbutrin. Gastric sleeve approx 1 year ago.  Decided on wegovy. Discussed titration and side effects.  Follow up in one  month with how you are doing and dose increase.  2months in office.  Consider app to tract food.   Spent 20 minutes with patient with weight loss counseling.

## 2020-01-22 ENCOUNTER — Other Ambulatory Visit (HOSPITAL_COMMUNITY): Payer: Self-pay | Admitting: Psychiatric/Mental Health

## 2020-01-22 MED FILL — lamoTRIgine 200 MG TABS: 200 | 30 days supply | Qty: 60 | Fill #0

## 2020-01-22 MED FILL — buPROPion HCL ER (XL) 300 M: 300 | 30 days supply | Qty: 30 | Fill #0

## 2020-01-22 MED FILL — PROPRANOLOL 20 MG TABLET: 20 | 23 days supply | Qty: 90 | Fill #0

## 2020-01-22 MED FILL — VRAYLAR 1.5 MG CAPSULE: 1.5 | 30 days supply | Qty: 30 | Fill #0

## 2020-01-27 ENCOUNTER — Telehealth: Payer: Self-pay | Admitting: Neurology

## 2020-01-27 NOTE — Telephone Encounter (Signed)
Prior Authorization for Wegovy submitted via covermymeds. Awaiting response.  

## 2020-01-28 ENCOUNTER — Other Ambulatory Visit (HOSPITAL_COMMUNITY): Payer: Self-pay | Admitting: Psychiatric/Mental Health

## 2020-01-28 DIAGNOSIS — F17200 Nicotine dependence, unspecified, uncomplicated: Secondary | ICD-10-CM | POA: Diagnosis not present

## 2020-01-28 DIAGNOSIS — F319 Bipolar disorder, unspecified: Secondary | ICD-10-CM | POA: Diagnosis not present

## 2020-01-29 ENCOUNTER — Telehealth: Payer: Self-pay

## 2020-01-29 NOTE — Telephone Encounter (Signed)
Pt wanted to know if she can get a prescription of her 2nd month of her 63.

## 2020-01-30 ENCOUNTER — Other Ambulatory Visit (HOSPITAL_COMMUNITY): Payer: Self-pay | Admitting: Psychiatric/Mental Health

## 2020-01-30 MED ORDER — WEGOVY 0.5 MG/0.5ML ~~LOC~~ SOAJ: 0.5000 mg | SUBCUTANEOUS | 0 refills | Status: DC

## 2020-01-30 MED FILL — METFORMIN HCL ER 500 MG TB2: 500 | 30 days supply | Qty: 60 | Fill #0

## 2020-01-30 NOTE — Telephone Encounter (Signed)
PA approval for Wegovy from 01/27/20-07/25/20. Charlotte Hobbs has been updated.   Additional PA approvals per 28 days:  Wegovy 0.5 (pa [id number]) Wegovy 1 mg (pa [id number]) Wegovy 1.7 mg (pa [id number]) Wegovy 2.4 mg ([id number])

## 2020-01-30 NOTE — Telephone Encounter (Signed)
Pt is aware of her wegovy.

## 2020-01-30 NOTE — Telephone Encounter (Signed)
Sent!

## 2020-02-13 ENCOUNTER — Other Ambulatory Visit: Payer: Self-pay

## 2020-02-13 ENCOUNTER — Other Ambulatory Visit: Payer: Self-pay | Admitting: Nurse Practitioner

## 2020-02-13 ENCOUNTER — Ambulatory Visit (INDEPENDENT_AMBULATORY_CARE_PROVIDER_SITE_OTHER): Payer: 59 | Admitting: Nurse Practitioner

## 2020-02-13 ENCOUNTER — Encounter: Payer: Self-pay | Admitting: Nurse Practitioner

## 2020-02-13 VITALS — BP 126/84 | HR 77 | Wt 217.2 lb

## 2020-02-13 DIAGNOSIS — H60313 Diffuse otitis externa, bilateral: Secondary | ICD-10-CM

## 2020-02-13 DIAGNOSIS — L219 Seborrheic dermatitis, unspecified: Secondary | ICD-10-CM

## 2020-02-13 MED ORDER — CIPROFLOXACIN-DEXAMETHASONE 0.3-0.1 % OT SUSP
4.0000 [drp] | Freq: Two times a day (BID) | OTIC | 0 refills | Status: DC
Start: 2020-02-13 — End: 2020-02-13

## 2020-02-13 MED ORDER — MOMETASONE FUROATE 0.1 % EX CREA
1.0000 | TOPICAL_CREAM | Freq: Every day | CUTANEOUS | 0 refills | Status: DC
Start: 2020-02-13 — End: 2020-02-13

## 2020-02-13 MED FILL — CIPROFLOXACIN-DEXAMETHASONE: 0.3-0.1 | 7 days supply | Qty: 8 | Fill #0

## 2020-02-13 MED FILL — MOMETASONE FUROATE 0.1% CRM: 0.1 | 30 days supply | Qty: 45 | Fill #0

## 2020-02-13 NOTE — Progress Notes (Signed)
Acute Office Visit  Subjective:    Patient ID: Charlotte Hobbs, female    DOB: [birth year], 50 y.o.   MRN: [id number]  No chief complaint on file.   HPI Patient is in today for left sided ear fullness, drainage, and tenderness that has been going on for about a week. She reports that the symptoms are actually better today, but she is having some flaking of the skin on both ears that she is concerned about, as well.   She denies fever, chills, or difficulty hearing.   Past Medical History:  Diagnosis Date  . Bipolar disorder ([place])   . Depression    bi polar  . [facility]ia [facility])     Past Surgical History:  Procedure Laterality Date  . BACK SURGERY    . CESAREAN SECTION  2002  . LAPAROSCOPIC GASTRIC SLEEVE RESECTION N/A 09/16/2018   Procedure: LAPAROSCOPIC GASTRIC SLEEVE RESECTION, Upper Endo, ERAS Pathway;  Surgeon: Clovis Riley, MD;  Location: [facility];  Service: General;  Laterality: N/A;  . LUMBAR LAMINECTOMY/DECOMPRESSION MICRODISCECTOMY  11/13/2011   Procedure: LUMBAR LAMINECTOMY/DECOMPRESSION MICRODISCECTOMY 2 LEVELS;  Surgeon: Ophelia Charter, MD;  Location: [place] [facility];  Service: Neurosurgery;  Laterality: Right;  RIGHT Lumbar four-five Lumbar five sacral one diskectomy  . MYOMECTOMY  1996    Family History  Problem Relation Age of Onset  . Hypertension Father   . Heart attack Father 77  . Anxiety disorder Father   . Heart attack Paternal Grandfather 10  . Heart disease Maternal Grandmother     Social History   Socioeconomic History  . Marital status: Divorced    Spouse name: Not on file  . Number of children: 1  . Years of education: Not on file  . Highest education level: Not on file  Occupational History  . Occupation: Horticulturist, commercial: [employer]  Tobacco Use  . Smoking status: Former Smoker    Packs/day: 0.50    Years: 25.00    Pack years: 12.50    Types: Cigarettes    Quit date: 03/2018    Years since quitting: 1.9   . Smokeless tobacco: Never Used  Vaping Use  . Vaping Use: Never used  Substance and Sexual Activity  . Alcohol use: Not Currently    Alcohol/week: 0.0 standard drinks  . Drug use: No  . Sexual activity: Not Currently    Birth control/protection: Condom  Other Topics Concern  . Not on file  Social History Narrative   No regular exercise.  Divorced.    Social Determinants of Health   Financial Resource Strain: Not on file  Food Insecurity: Not on file  Transportation Needs: Not on file  Physical Activity: Not on file  Stress: Not on file  Social Connections: Not on file  Intimate Partner Violence: Not on file    Outpatient Medications Prior to Visit  Medication Sig Dispense Refill  . buPROPion (WELLBUTRIN XL) 300 MG 24 hr tablet Take 300 mg by mouth daily.     . cariprazine (VRAYLAR) capsule Take 1.5 mg by mouth daily.     [name] docusate sodium (COLACE) 100 MG capsule Take 1 capsule (100 mg total) by mouth 2 (two) times daily. 10 capsule 0  . gabapentin (NEURONTIN) 100 MG capsule Take 2 capsules (200 mg total) by mouth every 12 (twelve) hours. 20 capsule 0  . lamoTRIgine (LAMICTAL) 200 MG tablet Take 200 mg by mouth 2 (two) times daily.    [name]  pantoprazole (PROTONIX) 40 MG tablet Take 1 tablet (40 mg total) by mouth daily. 90 tablet 0  . propranolol (INDERAL) 20 MG tablet Take 20 mg by mouth 3 (three) times daily.    . Semaglutide-Weight Management (WEGOVY) 0.5 MG/0.5ML SOAJ Inject 0.5 mg into the skin once a week. 2 mL 0  . traMADol (ULTRAM) 50 MG tablet Take 1 tablet (50 mg total) by mouth every 6 (six) hours as needed (pain). 10 tablet 0   No facility-administered medications prior to visit.    No Known Allergies  Review of Systems All review of systems negative except what is listed in the HPI     Objective:    Physical Exam Vitals and nursing note reviewed.  Constitutional:      Appearance: Normal appearance.  HENT:     Head: Normocephalic.     Right Ear:  Tympanic membrane normal. No decreased hearing noted. Swelling present. No drainage or tenderness. No middle ear effusion. No mastoid tenderness. Tympanic membrane is not injected.     Left Ear: Tympanic membrane normal. No decreased hearing noted. Drainage and swelling present.  No middle ear effusion. No mastoid tenderness. Tympanic membrane is not injected or erythematous.     Ears:   Eyes:     Extraocular Movements: Extraocular movements intact.     Conjunctiva/sclera: Conjunctivae normal.     Pupils: Pupils are equal, round, and reactive to light.  Neurological:     Mental Status: She is alert.     BP 126/84   Pulse 77   Wt 217 lb 3.2 oz (98.5 kg)   SpO2 96%   BMI 37.28 kg/m  Wt Readings from Last 3 Encounters:  02/13/20 217 lb 3.2 oz (98.5 kg)  01/05/20 222 lb (100.7 kg)  08/14/19 200 lb (90.7 kg)    Health Maintenance Due  Topic Date Due  . Hepatitis C Screening  Never done  . HIV Screening  Never done  . COLONOSCOPY (Pts 45-60yrs Insurance coverage will need to be confirmed)  Never done  . TETANUS/TDAP  07/17/2019    There are no preventive care reminders to display for this patient.   Lab Results  Component Value Date   TSH 0.90 01/01/2018   Lab Results  Component Value Date   WBC 4.6 08/14/2019   HGB 14.3 08/14/2019   HCT 41.2 08/14/2019   MCV 93.6 08/14/2019   PLT 237 08/14/2019   Lab Results  Component Value Date   NA 142 08/14/2019   K 3.7 08/14/2019   CO2 25 08/14/2019   GLUCOSE 101 08/14/2019   BUN 12 08/14/2019   CREATININE 0.93 08/14/2019   BILITOT 0.4 08/14/2019   ALKPHOS 53 09/17/2018   AST 13 08/14/2019   ALT 16 08/14/2019   PROT 6.2 08/14/2019   ALBUMIN 3.4 (L) 09/17/2018   CALCIUM 8.7 08/14/2019   ANIONGAP 9 09/18/2018   Lab Results  Component Value Date   CHOL 174 11/17/2013   Lab Results  Component Value Date   HDL 46 11/17/2013   Lab Results  Component Value Date   LDLCALC 107 (H) 11/17/2013   Lab Results   Component Value Date   TRIG 107 11/17/2013   Lab Results  Component Value Date   CHOLHDL 3.8 11/17/2013   Lab Results  Component Value Date   HGBA1C 5.1 01/01/2018       Assessment & Plan:   1. Seborrheic dermatitis Symptoms and presentation consistent with seborrheic dermatitis to the external ear.  Recommend alternating cleansing routine with baby shampoo and dandruff shampoo, such as nizoral to help keep flaking to a minimum.  Prescription for mometasone provided for daily application when itching, flaking, and erythema are present.  - mometasone (ELOCON) 0.1 % cream; Apply 1 application topically daily.  Dispense: 45 g; Refill: 0  2. Acute diffuse otitis externa of both ears Otitis externa bilaterally with the left > right.  Will begin treatment today with Ciprodex drops for 7 days bilaterally.  Recommend utilizing cotton balls at the auditory meatus for prevention of water entering the ears when showering/bathing.  Dry ears thoroughly after swimming or in water.  May use a solution with rubbing alcohol and white vinegar after submersion in water to help dry the ears out and prevent growth.  Follow-up if symptoms worsen or fail to improve.  - ciprofloxacin-dexamethasone (CIPRODEX) OTIC suspension; Place 4 drops into both ears 2 (two) times daily for 7 days.  Dispense: 7.5 mL; Refill: 0   Orma Render, NP

## 2020-02-13 NOTE — Patient Instructions (Signed)
Otitis Externa  Otitis externa is an infection of the outer ear canal. The outer ear canal is the area between the outside of the ear and the eardrum. Otitis externa is sometimes called swimmer's ear. What are the causes? Common causes of this condition include:  Swimming in dirty water.  Moisture in the ear.  An injury to the inside of the ear.  An object stuck in the ear.  A cut or scrape on the outside of the ear. What increases the risk? You are more likely to develop this condition if you go swimming often. What are the signs or symptoms? The first symptom of this condition is often itching in the ear. Later symptoms of the condition include:  Swelling of the ear.  Redness in the ear.  Ear pain. The pain may get worse when you pull on your ear.  Pus coming from the ear. How is this diagnosed? This condition may be diagnosed by examining the ear and testing fluid from the ear for bacteria and funguses. How is this treated? This condition may be treated with:  Antibiotic ear drops. These are often given for 10-14 days.  Medicines to reduce itching and swelling. Follow these instructions at home:  If you were prescribed antibiotic ear drops, use them as told by your health care provider. Do not stop using the antibiotic even if your condition improves.  Take over-the-counter and prescription medicines only as told by your health care provider.  Avoid getting water in your ears as told by your health care provider. This may include avoiding swimming or water sports for a few days.  Keep all follow-up visits as told by your health care provider. This is important. How is this prevented?  Keep your ears dry. Use the corner of a towel to dry your ears after you swim or bathe.  Avoid scratching or putting things in your ear. Doing these things can damage the ear canal or remove the protective wax that lines it, which makes it easier for bacteria and funguses to  grow.  Avoid swimming in lakes, polluted water, or pools that may not have enough chlorine. Contact a health care provider if:  You have a fever.  Your ear is still red, swollen, painful, or draining pus after 3 days.  Your redness, swelling, or pain gets worse.  You have a severe headache.  You have redness, swelling, pain, or tenderness in the area behind your ear. Summary  Otitis externa is an infection of the outer ear canal.  Common causes include swimming in dirty water, moisture in the ear, or a cut or scrape in the ear.  Symptoms include pain, redness, and swelling of the ear.  If you were prescribed antibiotic ear drops, use them as told by your health care provider. Do not stop using the antibiotic even if your condition improves. This information is not intended to replace advice given to you by your health care provider. Make sure you discuss any questions you have with your health care provider. Document Revised: 06/08/2017 Document Reviewed: 06/08/2017 Elsevier Patient Education  2021 [employer].   Seborrheic Dermatitis  What is seborrheic dermatitis? Seborrheic (say: seb-oh-ree-ick) dermatitis is a disease that causes flaking of the skin.  It usually affects the scalp.  In teenagers and adults, it is commonly called "dandruff".  In infants, it is referred to as "cradle cap".  Dandruff often appears as scaling on the scalp with or without redness.  On other parts of the  body, seborrheic dermatitis tends to produce both redness and scaling.  Other common locations of seborrheic dermatitis include the central face, eyebrows, chest, and the creases of the arms, legs, and groin.  It often causes the skin to look a little greasy, scaly, or flaky. Seborrheic dermatitis can occur at any age.  It often comes and goes and may to be seasonally related, especially in the Northern climates.  What causes seborrheic dermatitis? The exact cause is not known, though yeast of the  Malassezia species may be involved.  This organism is normally present on the skin in small numbers, but sometimes its numbers increase, especially in oily skin.  Treatments that reduce the yeast tend to improve seborrheic dermatitis.  How is seborrheic dermatitis treated? The treatment of seborrheic dermatitis depends on its location on the body and the person's age. Seborrheic dermatitis of the scalp (dandruff) in adults and teenagers is usually treated with a medicated shampoo.  Here is a list of the medications that help, and the over-counter shampoos that contain them:  Salicylic acid (Neutrogena T/Sal, Sebulex, Scalpicin, Denorex Extra Strength)  Zinc pyrithione (Head & Shoulders white bottle, Denorex Daily, DHS Zinc, Pantene Pro-V Pyrithione Zinc)  Selenium sulfide (Head & Shoulders blue bottle, Selsun Blue, Exsel Lotion Shampoo, Glo-Sel)  Yahoo tar (Neutrogena T/Gal, Pentrax, Zetar, Tegrin, Viacom, Therapeutic Denorex)  Ketoconazole (Nizoral)  If you have dandruff, you might start by using one of these shampoos every day until your dandruff is controlled and then keep using it at least twice a week.  Often times your doctor will recommend a rotation of several different medicated shampoos as some will experience a plateau in the effectiveness of any one shampoo.   When you use a dandruff shampoo, rub the shampoo into your wet hair and massage into scalp thoroughly.  Let it stay on your hair and scalp for 5 minutes before rinsing.  If you have involvement in the eyebrows or face, you can lather those areas with the medicated shampoo as well, or use a medicated soap (ZNP-bar, Polytar Soap, SAStid, or sulfur soap).    If the wash or shampoo alone does not help, your doctor might want you to use a prescription medication once or twice a day.  Leave-in medications for the scalp are best applied by massaging into the scalp immediately after towel drying your hair, but may be applied even if you  have not washed your hair.  Seborrheic dermatitis in infants usually clears up by age 26 -42 months.  It may develop in the diaper area where it might be confused with diaper rash.  For milder cases you can try gently brushing out scales with a soft brush.  This is best done immediately after washing with a non-medicated baby shampoo Wynetta Emery and Royce Macadamia, etc.).  Your doctor may recommend a medicated shampoo or a prescription topical medication.

## 2020-02-19 MED FILL — PROPRANOLOL 20 MG TABLET: 20 | 23 days supply | Qty: 90 | Fill #0

## 2020-03-03 ENCOUNTER — Telehealth: Payer: Self-pay | Admitting: Neurology

## 2020-03-03 DIAGNOSIS — F17200 Nicotine dependence, unspecified, uncomplicated: Secondary | ICD-10-CM | POA: Diagnosis not present

## 2020-03-03 DIAGNOSIS — F319 Bipolar disorder, unspecified: Secondary | ICD-10-CM | POA: Diagnosis not present

## 2020-03-03 MED ORDER — WEGOVY 1 MG/0.5ML ~~LOC~~ SOAJ: 1.0000 mg | SUBCUTANEOUS | 0 refills | Status: DC

## 2020-03-03 NOTE — Telephone Encounter (Signed)
Patient left vm stating she needs the next dose of [facility] sent to Pharmacy. Wegovy 1 mg sent.

## 2020-03-04 MED FILL — buPROPion HCL ER (XL) 300 M: 300 | 30 days supply | Qty: 30 | Fill #0

## 2020-03-04 MED FILL — VRAYLAR 1.5 MG CAPSULE: 1.5 | 30 days supply | Qty: 30 | Fill #0

## 2020-03-05 ENCOUNTER — Other Ambulatory Visit (HOSPITAL_COMMUNITY): Payer: Self-pay | Admitting: Psychiatric/Mental Health

## 2020-03-05 MED FILL — lamoTRIgine 200 MG TABS: 200 | 30 days supply | Qty: 60 | Fill #0

## 2020-03-08 MED FILL — PROPRANOLOL 20 MG TABLET: 20 | 23 days supply | Qty: 90 | Fill #1

## 2020-03-17 DIAGNOSIS — F319 Bipolar disorder, unspecified: Secondary | ICD-10-CM | POA: Diagnosis not present

## 2020-04-02 ENCOUNTER — Telehealth: Payer: Self-pay | Admitting: Neurology

## 2020-04-02 MED ORDER — WEGOVY 1.7 MG/0.75ML ~~LOC~~ SOAJ: 1.7000 mg | SUBCUTANEOUS | 0 refills | Status: DC

## 2020-04-02 NOTE — Telephone Encounter (Signed)
Patient left vm stating she needs the next dose of [facility] sent to Pharmacy. Wegovy 1.7 mg sent.

## 2020-04-07 ENCOUNTER — Encounter (HOSPITAL_COMMUNITY): Payer: Self-pay | Admitting: *Deleted

## 2020-04-15 MED FILL — VRAYLAR 1.5 MG CAPSULE: 1.5 | 30 days supply | Qty: 30 | Fill #1

## 2020-04-15 MED FILL — lamoTRIgine 200 MG TABS: 200 | 30 days supply | Qty: 60 | Fill #1

## 2020-04-15 MED FILL — PROPRANOLOL 20 MG TABLET: 20 | 23 days supply | Qty: 90 | Fill #2

## 2020-04-15 MED FILL — buPROPion HCL ER (XL) 300 M: 300 | 30 days supply | Qty: 30 | Fill #1

## 2020-04-21 DIAGNOSIS — F17200 Nicotine dependence, unspecified, uncomplicated: Secondary | ICD-10-CM | POA: Diagnosis not present

## 2020-04-21 DIAGNOSIS — F1010 Alcohol abuse, uncomplicated: Secondary | ICD-10-CM | POA: Diagnosis not present

## 2020-04-21 DIAGNOSIS — F319 Bipolar disorder, unspecified: Secondary | ICD-10-CM | POA: Diagnosis not present

## 2020-04-27 ENCOUNTER — Other Ambulatory Visit (HOSPITAL_COMMUNITY): Payer: Self-pay

## 2020-04-27 MED ORDER — LAMOTRIGINE 200 MG PO TABS
200.0000 mg | ORAL_TABLET | Freq: Two times a day (BID) | ORAL | 2 refills | Status: DC
  Filled 2020-04-27: qty 60, 30d supply, fill #0

## 2020-04-27 MED ORDER — METFORMIN HCL ER 500 MG PO TB24
1000.0000 mg | ORAL_TABLET | Freq: Every day | ORAL | 2 refills | Status: DC
  Filled 2020-04-27: qty 60, 30d supply, fill #0

## 2020-04-27 MED ORDER — BUPROPION HCL ER (XL) 300 MG PO TB24
300.0000 mg | ORAL_TABLET | Freq: Every day | ORAL | 2 refills | Status: DC
  Filled 2020-04-27: qty 30, 30d supply, fill #0

## 2020-04-27 MED ORDER — PROPRANOLOL HCL 20 MG PO TABS
20.0000 mg | ORAL_TABLET | Freq: Four times a day (QID) | ORAL | 2 refills | Status: DC | PRN
  Filled 2020-04-27: qty 90, 30d supply, fill #0
  Filled 2020-05-11 (×2): qty 90, 23d supply, fill #0
  Filled 2020-05-27: qty 90, 23d supply, fill #1
  Filled 2020-07-01: qty 90, 23d supply, fill #2

## 2020-04-27 MED ORDER — CYANOCOBALAMIN 500 MCG PO TABS
500.0000 ug | ORAL_TABLET | Freq: Every day | ORAL | 2 refills | Status: AC
  Filled 2020-04-27: qty 30, 30d supply, fill #0

## 2020-04-27 MED ORDER — VRAYLAR 1.5 MG PO CAPS
1.5000 mg | ORAL_CAPSULE | Freq: Every day | ORAL | 2 refills | Status: DC
  Filled 2020-04-27 – 2020-10-18 (×2): qty 30, 30d supply, fill #0

## 2020-05-03 DIAGNOSIS — Z79899 Other long term (current) drug therapy: Secondary | ICD-10-CM | POA: Diagnosis not present

## 2020-05-03 DIAGNOSIS — F319 Bipolar disorder, unspecified: Secondary | ICD-10-CM | POA: Diagnosis not present

## 2020-05-07 ENCOUNTER — Other Ambulatory Visit (HOSPITAL_COMMUNITY): Payer: Self-pay

## 2020-05-07 ENCOUNTER — Other Ambulatory Visit: Payer: Self-pay

## 2020-05-11 ENCOUNTER — Ambulatory Visit: Payer: 59 | Admitting: Family Medicine

## 2020-05-11 ENCOUNTER — Ambulatory Visit: Payer: 59 | Admitting: Physician Assistant

## 2020-05-11 ENCOUNTER — Other Ambulatory Visit: Payer: Self-pay

## 2020-05-11 ENCOUNTER — Encounter: Payer: Self-pay | Admitting: Family Medicine

## 2020-05-11 ENCOUNTER — Other Ambulatory Visit (HOSPITAL_COMMUNITY): Payer: Self-pay

## 2020-05-11 VITALS — BP 138/78 | HR 78 | Ht 64.0 in | Wt 213.0 lb

## 2020-05-11 DIAGNOSIS — Z7689 Persons encountering health services in other specified circumstances: Secondary | ICD-10-CM

## 2020-05-11 DIAGNOSIS — N921 Excessive and frequent menstruation with irregular cycle: Secondary | ICD-10-CM | POA: Diagnosis not present

## 2020-05-11 NOTE — Assessment & Plan Note (Signed)
Starting weight: 222 pounds. Today's weight: 213 pounds Previous weight: 222 pounds Change in weight: Down 9 pounds Dietary goal: Work on increasing water intake.  Shoot for about 45 ounces total daily with ultimately a goal of between 60 to 80 ounces daily.  Also encouraged her to start eating protein with every meal and reviewed some potential sources of protein Exercise goal: Continue yard work on the weekends and add 2 to 3 days of aerobic activity such as walking for at least 20 minutes. Medication: Continue Wegovy 1.7 mg.  Will increase dose at next refill.  She is tolerating it well without significant nausea Follow-up: 8 weeks.

## 2020-05-11 NOTE — Progress Notes (Signed)
Established Patient Office Visit  Subjective:  Patient ID: Charlotte Hobbs, female    DOB: 05-Nov-1970  Age: 50 y.o. MRN: [id number]  CC:  Chief Complaint  Patient presents with  . Follow-up    HPI Charlotte Hobbs presents for   Weight Management - she is doing well on the [facility].  She was started on the medication at the end of December. She has been on it for 3 months.  She is up to 1.7mg  daily.  She has lost 9 lbs in the last 3 months.  She has been tolerating the medication well without any significant side effects she has had a little bit of mild nausea but its been very mild.  She says she is really been doing great with eating smaller portions.  She really does not drink a lot of fluid or water daily.  She is not really necessarily changing what she is eating just more so the portions.  She also wanted to discuss her.  She says she has been getting a period of most every 2 weeks for the last several months she denies any hot flashes or vaginal dryness she does wonder if she could be perimenopausal though.  When I saw her last summer she was actually experiencing heavy periods she is concerned because her mother had ovarian cancer in her 27s.  Past Medical History:  Diagnosis Date  . Bipolar disorder ([place])   . Depression    bi polar  . [facility])     Past Surgical History:  Procedure Laterality Date  . BACK SURGERY    . CESAREAN SECTION  2002  . LAPAROSCOPIC GASTRIC SLEEVE RESECTION N/A 09/16/2018   Procedure: LAPAROSCOPIC GASTRIC SLEEVE RESECTION, Upper Endo, ERAS Pathway;  Surgeon: Clovis Riley, MD;  Location: [facility] ORS;  Service: General;  Laterality: N/A;  . LUMBAR LAMINECTOMY/DECOMPRESSION MICRODISCECTOMY  11/13/2011   Procedure: LUMBAR LAMINECTOMY/DECOMPRESSION MICRODISCECTOMY 2 LEVELS;  Surgeon: Ophelia Charter, MD;  Location: [place] [facility];  Service: Neurosurgery;  Laterality: Right;  RIGHT Lumbar four-five Lumbar five sacral one diskectomy  . MYOMECTOMY  1996     Family History  Problem Relation Age of Onset  . Hypertension Father   . Heart attack Father 72  . Anxiety disorder Father   . Heart attack Paternal Grandfather 56  . Heart disease Maternal Grandmother     Social History   Socioeconomic History  . Marital status: Divorced    Spouse name: Not on file  . Number of children: 1  . Years of education: Not on file  . Highest education level: Not on file  Occupational History  . Occupation: Horticulturist, commercial: [employer]  Tobacco Use  . Smoking status: Former Smoker    Packs/day: 0.50    Years: 25.00    Pack years: 12.50    Types: Cigarettes    Quit date: 03/2018    Years since quitting: 2.1  . Smokeless tobacco: Never Used  Vaping Use  . Vaping Use: Never used  Substance and Sexual Activity  . Alcohol use: Not Currently    Alcohol/week: 0.0 standard drinks  . Drug use: No  . Sexual activity: Not Currently    Birth control/protection: Condom  Other Topics Concern  . Not on file  Social History Narrative   No regular exercise.  Divorced.    Social Determinants of Health   Financial Resource Strain: Not on file  Food Insecurity: Not on  file  Transportation Needs: Not on file  Physical Activity: Not on file  Stress: Not on file  Social Connections: Not on file  Intimate Partner Violence: Not on file    Outpatient Medications Prior to Visit  Medication Sig Dispense Refill  . buPROPion (WELLBUTRIN XL) 300 MG 24 hr tablet Take 300 mg by mouth daily.     [name] buPROPion (WELLBUTRIN XL) 300 MG 24 hr tablet TAKE 1 TABLET BY MOUTH ONCE DAILY 30 tablet 2  . buPROPion (WELLBUTRIN XL) 300 MG 24 hr tablet Take 1 tablet by mouth once a day 30 tablet 2  . cariprazine (VRAYLAR) capsule Take 1 capsule by mouth daily at bedtime 30 capsule 2  . lamoTRIgine (LAMICTAL) 200 MG tablet Take 1 tablet by mouth two times a day 60 tablet 2  . metFORMIN (GLUCOPHAGE-XR) 500 MG 24 hr tablet Take 2 tablets by  mouth every day with food 60 tablet 2  . mometasone (ELOCON) 0.1 % cream APPLY 1 APPLICATION TOPICALLY DAILY. 45 g 0  . pantoprazole (PROTONIX) 40 MG tablet Take 1 tablet (40 mg total) by mouth daily. 90 tablet 0  . propranolol (INDERAL) 20 MG tablet Take 1 tablet by mouth every 6 hours as needed for anxiety 90 tablet 2  . Semaglutide-Weight Management (WEGOVY) 1.7 MG/0.75ML SOAJ Inject 1.7 mg into the skin once a week. 3 mL 0  . vitamin B-12 (CYANOCOBALAMIN) 500 MCG tablet Take one tablet by mouth daily 30 tablet 2  . buPROPion (WELLBUTRIN XL) 300 MG 24 hr tablet TAKE 1 TABLET BY MOUTH ONCE A DAY 30 tablet 0  . buPROPion (WELLBUTRIN XL) 300 MG 24 hr tablet TAKE 1 TABLET BY MOUTH ONCE DAILY 30 tablet 3  . cariprazine (VRAYLAR) capsule Take 1.5 mg by mouth daily.     . cariprazine (VRAYLAR) capsule TAKE 1 CAPSULE BY MOUTH ONCE DAILY 30 capsule 2  . cariprazine (VRAYLAR) capsule TAKE 1 CAPSULE BY MOUTH AT BEDTIME 30 capsule 0  . cariprazine (VRAYLAR) capsule TAKE 1 CAPSULE BY MOUTH AT BEDTIME 30 capsule 3  . ciprofloxacin-dexamethasone (CIPRODEX) OTIC suspension PLACE 4 DROPS INTO BOTH EARS 2 (TWO) TIMES DAILY FOR 7 DAYS. 7.5 mL 0  . docusate sodium (COLACE) 100 MG capsule Take 1 capsule (100 mg total) by mouth 2 (two) times daily. 10 capsule 0  . gabapentin (NEURONTIN) 100 MG capsule Take 2 capsules (200 mg total) by mouth every 12 (twelve) hours. 20 capsule 0  . lamoTRIgine (LAMICTAL) 200 MG tablet Take 200 mg by mouth 2 (two) times daily.    [name] lamoTRIgine (LAMICTAL) 200 MG tablet TAKE 1 TABLET BY MOUTH TWICE DAILY. 60 tablet 2  . lamoTRIgine (LAMICTAL) 200 MG tablet TAKE 1 TABLET BY MOUTH 2 TIMES A DAY 60 tablet 0  . lamoTRIgine (LAMICTAL) 200 MG tablet TAKE 1 TABLET BY MOUTH 2 TIMES A DAY 60 tablet 3  . metFORMIN (GLUCOPHAGE-XR) 500 MG 24 hr tablet TAKE 2 TABLETS BY MOUTH DAILY WITH FOOD 60 tablet 2  . propranolol (INDERAL) 20 MG tablet Take 20 mg by mouth 3 (three) times daily.    .  propranolol (INDERAL) 20 MG tablet TAKE 1 TABLET BY MOUTH EVERY 6 HOURS AS NEEDED FOR ANXIETY 90 tablet 2  . propranolol (INDERAL) 20 MG tablet TAKE 1 TABLET BY MOUTH EVERY 6 HOURS AS NEEDED FOR ANXIETY 90 tablet 0  . propranolol (INDERAL) 20 MG tablet TAKE 1 TABLET BY MOUTH EVERY 6 HOURS AS NEEDED FOR ANXIETY 90 tablet 99  .  traMADol (ULTRAM) 50 MG tablet Take 1 tablet (50 mg total) by mouth every 6 (six) hours as needed (pain). 10 tablet 0  . vitamin B-12 (CYANOCOBALAMIN) 500 MCG tablet TAKE ONE TABLET DAILY 30 tablet 2  . vitamin B-12 (CYANOCOBALAMIN) 500 MCG tablet TAKE ONE TABLET BY MOUTH DAILY 30 tablet 0   No facility-administered medications prior to visit.    No Known Allergies  ROS Review of Systems    Objective:    Physical Exam Constitutional:      Appearance: She is well-developed.  HENT:     Head: Normocephalic and atraumatic.  Cardiovascular:     Rate and Rhythm: Normal rate and regular rhythm.     Heart sounds: Normal heart sounds.  Pulmonary:     Effort: Pulmonary effort is normal.     Breath sounds: Normal breath sounds.  Skin:    General: Skin is warm and dry.  Neurological:     Mental Status: She is alert and oriented to person, place, and time.  Psychiatric:        Behavior: Behavior normal.     BP 138/78   Pulse 78   Ht 5\' 4"  (1.626 m)   Wt 213 lb (96.6 kg)   SpO2 99%   BMI 36.56 kg/m  Wt Readings from Last 3 Encounters:  05/11/20 213 lb (96.6 kg)  02/13/20 217 lb 3.2 oz (98.5 kg)  01/05/20 222 lb (100.7 kg)     Health Maintenance Due  Topic Date Due  . Hepatitis C Screening  Never done  . HIV Screening  Never done  . COLONOSCOPY (Pts 45-53yrs Insurance coverage will need to be confirmed)  Never done  . TETANUS/TDAP  07/17/2019  . COVID-19 Vaccine (3 - Booster for Pfizer series) 08/11/2019    There are no preventive care reminders to display for this patient.  Lab Results  Component Value Date   TSH 0.90 01/01/2018   Lab Results   Component Value Date   WBC 4.6 08/14/2019   HGB 14.3 08/14/2019   HCT 41.2 08/14/2019   MCV 93.6 08/14/2019   PLT 237 08/14/2019   Lab Results  Component Value Date   NA 142 08/14/2019   K 3.7 08/14/2019   CO2 25 08/14/2019   GLUCOSE 101 08/14/2019   BUN 12 08/14/2019   CREATININE 0.93 08/14/2019   BILITOT 0.4 08/14/2019   ALKPHOS 53 09/17/2018   AST 13 08/14/2019   ALT 16 08/14/2019   PROT 6.2 08/14/2019   ALBUMIN 3.4 (L) 09/17/2018   CALCIUM 8.7 08/14/2019   ANIONGAP 9 09/18/2018   Lab Results  Component Value Date   CHOL 174 11/17/2013   Lab Results  Component Value Date   HDL 46 11/17/2013   Lab Results  Component Value Date   LDLCALC 107 (H) 11/17/2013   Lab Results  Component Value Date   TRIG 107 11/17/2013   Lab Results  Component Value Date   CHOLHDL 3.8 11/17/2013   Lab Results  Component Value Date   HGBA1C 5.1 01/01/2018      Assessment & Plan:   Problem List Items Addressed This Visit      Other   Encounter for weight management - Primary    Starting weight: 222 pounds. Today's weight: 213 pounds Previous weight: 222 pounds Change in weight: Down 9 pounds Dietary goal: Work on increasing water intake.  Shoot for about 45 ounces total daily with ultimately a goal of between 60 to 80 ounces daily.  Also encouraged her to start eating protein with every meal and reviewed some potential sources of protein Exercise goal: Continue yard work on the weekends and add 2 to 3 days of aerobic activity such as walking for at least 20 minutes. Medication: Continue Wegovy 1.7 mg.  Will increase dose at next refill.  She is tolerating it well without significant nausea Follow-up: 8 weeks.        Other Visit Diagnoses    Metrorrhagia       Relevant Orders   Progesterone   Estradiol   Follicle stimulating hormone   Luteinizing hormone   US Pelvic Complete With Transvaginal     Metrorrhagia-discussed that she could certainly be perimenopausal  it could also be the medication and weight loss causing.  Irregularities as well.  We will go ahead and check hormone levels as well as schedule her for a pelvic ultrasound.  Consider birth control as an option to control her cycles if everything is normal.  I spent 32 minutes on the day of the encounter to include pre-visit record review, face-to-face time with the patient and post visit ordering of test.   No orders of the defined types were placed in this encounter.   Follow-up: Return in about 2 weeks (around 05/25/2020) for weight management .      Beatrice Lecher, MD

## 2020-05-13 ENCOUNTER — Other Ambulatory Visit: Payer: Self-pay | Admitting: Physician Assistant

## 2020-05-13 NOTE — Telephone Encounter (Signed)
Are you increasing the dose?

## 2020-05-13 NOTE — Telephone Encounter (Signed)
Wegovy 2.4 mg prescription sent to start maintenance dosing.

## 2020-05-14 ENCOUNTER — Other Ambulatory Visit: Payer: 59

## 2020-05-17 ENCOUNTER — Other Ambulatory Visit: Payer: 59

## 2020-05-25 ENCOUNTER — Other Ambulatory Visit: Payer: Self-pay

## 2020-05-25 ENCOUNTER — Ambulatory Visit (INDEPENDENT_AMBULATORY_CARE_PROVIDER_SITE_OTHER): Payer: 59

## 2020-05-25 DIAGNOSIS — R350 Frequency of micturition: Secondary | ICD-10-CM

## 2020-05-25 DIAGNOSIS — D259 Leiomyoma of uterus, unspecified: Secondary | ICD-10-CM | POA: Diagnosis not present

## 2020-05-25 DIAGNOSIS — N921 Excessive and frequent menstruation with irregular cycle: Secondary | ICD-10-CM

## 2020-05-26 ENCOUNTER — Other Ambulatory Visit (HOSPITAL_COMMUNITY): Payer: Self-pay

## 2020-05-26 DIAGNOSIS — F17200 Nicotine dependence, unspecified, uncomplicated: Secondary | ICD-10-CM | POA: Diagnosis not present

## 2020-05-26 DIAGNOSIS — F319 Bipolar disorder, unspecified: Secondary | ICD-10-CM | POA: Diagnosis not present

## 2020-05-26 DIAGNOSIS — F1010 Alcohol abuse, uncomplicated: Secondary | ICD-10-CM | POA: Diagnosis not present

## 2020-05-26 MED ORDER — VRAYLAR 1.5 MG PO CAPS
1.5000 mg | ORAL_CAPSULE | Freq: Every day | ORAL | 2 refills | Status: DC
  Filled 2020-05-26: qty 30, 30d supply, fill #0
  Filled 2020-06-03 – 2020-06-04 (×2): qty 30, 30d supply, fill #1
  Filled 2020-07-15: qty 30, 30d supply, fill #2

## 2020-05-26 MED ORDER — B-12 500 MCG PO TABS: 1.0000 | ORAL_TABLET | Freq: Every day | ORAL | 2 refills | Status: DC

## 2020-05-26 MED ORDER — METFORMIN HCL ER 500 MG PO TB24
1000.0000 mg | ORAL_TABLET | Freq: Every day | ORAL | 2 refills | Status: DC
  Filled 2020-05-26: qty 60, 30d supply, fill #0

## 2020-05-26 MED ORDER — LAMOTRIGINE 200 MG PO TABS
200.0000 mg | ORAL_TABLET | Freq: Two times a day (BID) | ORAL | 2 refills | Status: DC
  Filled 2020-05-26: qty 60, 30d supply, fill #0
  Filled 2020-07-15: qty 60, 30d supply, fill #1
  Filled 2020-09-02: qty 60, 30d supply, fill #2

## 2020-05-26 MED ORDER — PROPRANOLOL HCL 20 MG PO TABS
20.0000 mg | ORAL_TABLET | Freq: Four times a day (QID) | ORAL | 2 refills | Status: DC | PRN
  Filled 2020-05-26 – 2020-07-22 (×2): qty 90, 23d supply, fill #0
  Filled 2020-08-26: qty 90, 23d supply, fill #1
  Filled 2020-09-22: qty 90, 23d supply, fill #2

## 2020-05-26 MED ORDER — INSULIN PEN NEEDLE 32G X 4 MM MISC
0 refills | Status: DC
  Filled 2020-05-27 – 2020-07-06 (×2): qty 100, 90d supply, fill #0

## 2020-05-26 MED ORDER — SAXENDA 18 MG/3ML ~~LOC~~ SOPN
3.0000 mg | PEN_INJECTOR | Freq: Every day | SUBCUTANEOUS | 1 refills | Status: DC
  Filled 2020-05-26: qty 15, 30d supply, fill #0
  Filled 2020-05-27: qty 30, 60d supply, fill #0
  Filled 2020-07-06 (×2): qty 45, 90d supply, fill #0

## 2020-05-26 MED ORDER — BUPROPION HCL ER (XL) 300 MG PO TB24
300.0000 mg | ORAL_TABLET | Freq: Every day | ORAL | 2 refills | Status: DC
  Filled 2020-05-26: qty 30, 30d supply, fill #0
  Filled 2020-07-15: qty 30, 30d supply, fill #1
  Filled 2020-09-02: qty 30, 30d supply, fill #2

## 2020-05-27 ENCOUNTER — Other Ambulatory Visit: Payer: Self-pay

## 2020-05-27 ENCOUNTER — Emergency Department (HOSPITAL_COMMUNITY)
Admission: EM | Admit: 2020-05-27 | Discharge: 2020-05-27 | Disposition: A | Discharge status: Home / Self Care | Payer: PRIVATE HEALTH INSURANCE | Attending: Emergency Medicine | Admitting: Emergency Medicine

## 2020-05-27 ENCOUNTER — Other Ambulatory Visit (HOSPITAL_COMMUNITY): Payer: Self-pay

## 2020-05-27 ENCOUNTER — Encounter (HOSPITAL_COMMUNITY): Payer: Self-pay | Admitting: Emergency Medicine

## 2020-05-27 DIAGNOSIS — R202 Paresthesia of skin: Secondary | ICD-10-CM | POA: Insufficient documentation

## 2020-05-27 DIAGNOSIS — Z5321 Procedure and treatment not carried out due to patient leaving prior to being seen by health care provider: Secondary | ICD-10-CM | POA: Insufficient documentation

## 2020-05-27 MED FILL — Bupropion HCl Tab ER 24HR 300 MG: ORAL | 30 days supply | Qty: 30 | Fill #0 | Status: CN

## 2020-05-27 NOTE — ED Triage Notes (Signed)
Pt states she tripped and fell today. Pt had a laminectomy 7 years ago. After falling, pt complains of numbness down her left leg. Pt states she is able to walk.

## 2020-05-27 NOTE — ED Notes (Signed)
Pt wanted to leave the lobby and states she was going to employee health.

## 2020-05-31 ENCOUNTER — Other Ambulatory Visit (HOSPITAL_COMMUNITY): Payer: Self-pay

## 2020-06-03 ENCOUNTER — Other Ambulatory Visit (HOSPITAL_COMMUNITY): Payer: Self-pay

## 2020-06-04 ENCOUNTER — Other Ambulatory Visit (HOSPITAL_COMMUNITY): Payer: Self-pay

## 2020-07-01 ENCOUNTER — Other Ambulatory Visit (HOSPITAL_COMMUNITY): Payer: Self-pay

## 2020-07-06 ENCOUNTER — Other Ambulatory Visit (HOSPITAL_COMMUNITY): Payer: Self-pay

## 2020-07-07 ENCOUNTER — Other Ambulatory Visit (HOSPITAL_COMMUNITY): Payer: Self-pay

## 2020-07-13 ENCOUNTER — Ambulatory Visit: Payer: 59 | Admitting: Family Medicine

## 2020-07-16 ENCOUNTER — Other Ambulatory Visit (HOSPITAL_COMMUNITY): Payer: Self-pay

## 2020-07-22 ENCOUNTER — Other Ambulatory Visit (HOSPITAL_COMMUNITY): Payer: Self-pay

## 2020-08-26 ENCOUNTER — Other Ambulatory Visit (HOSPITAL_COMMUNITY): Payer: Self-pay

## 2020-08-30 IMAGING — US US ABDOMEN COMPLETE
1 series · 13 of 25 positions shown · non-contrast
Comparison: None.

CLINICAL DATA: RIGHT flank pain

EXAM:
ABDOMEN ULTRASOUND COMPLETE

[Series 1: us abdomen complete · 0.09mm/px · 13 of 109 slices shown]
[im 1/109]
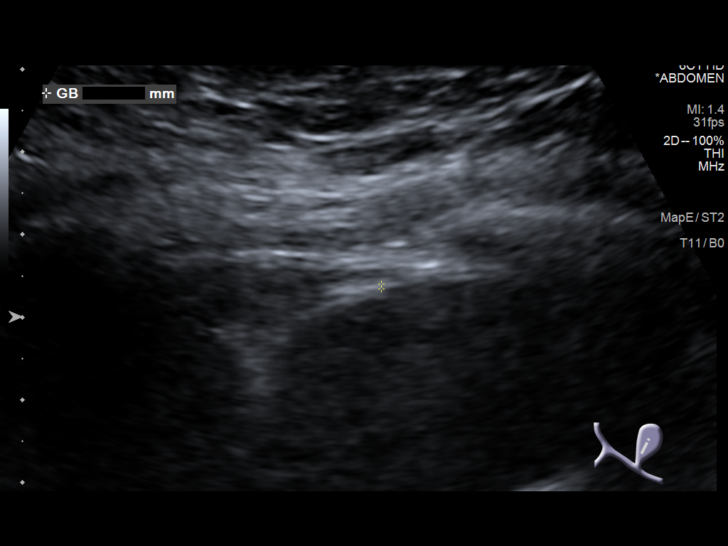
[im 10/109]
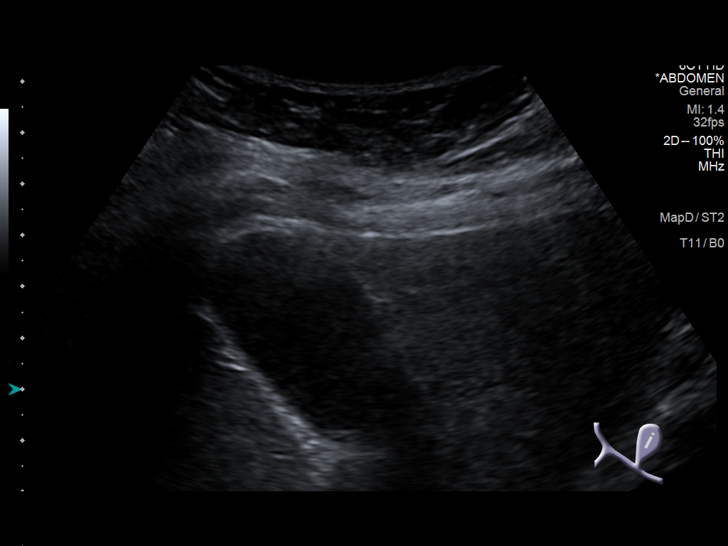
[im 19/109]
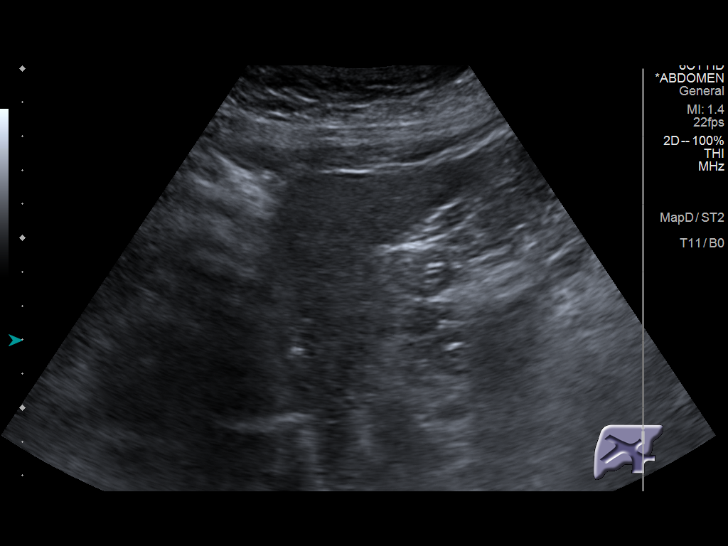
[im 28/109]
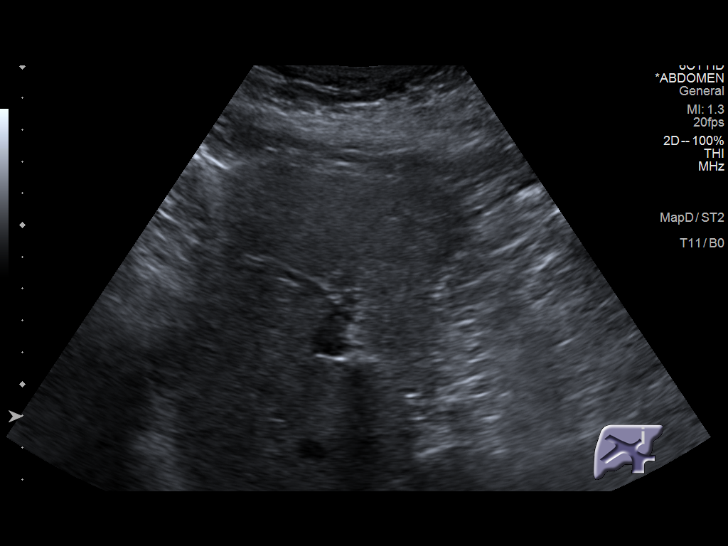
[im 37/109]
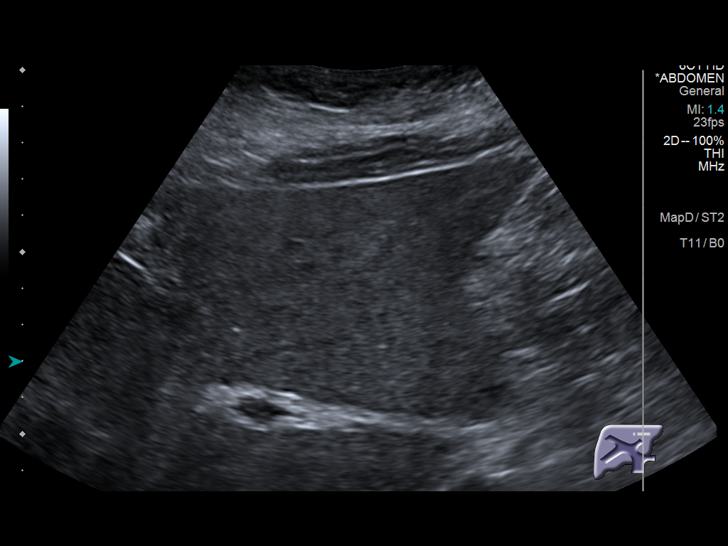
[im 46/109]
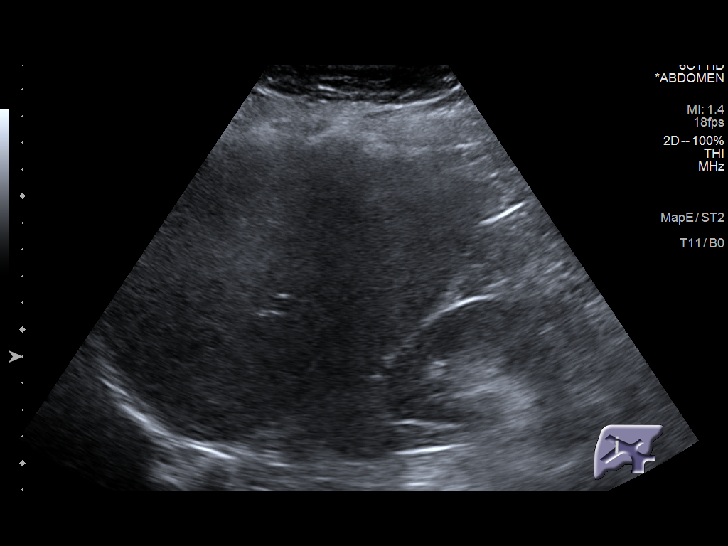
[im 55/109]
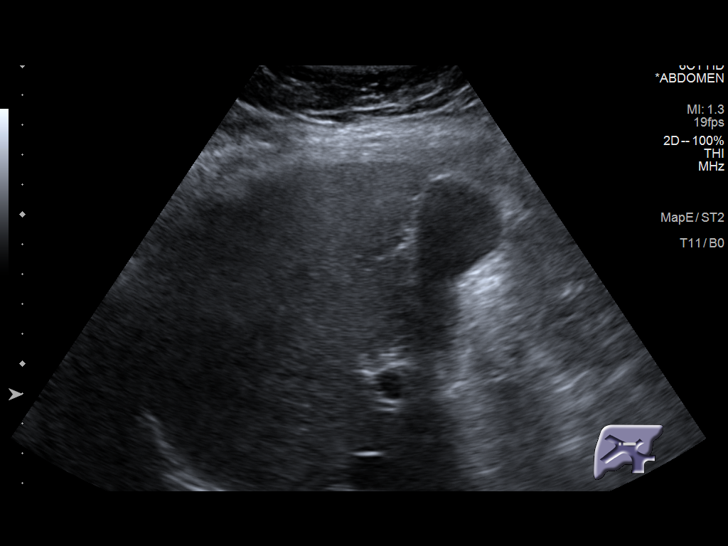
[im 64/109]
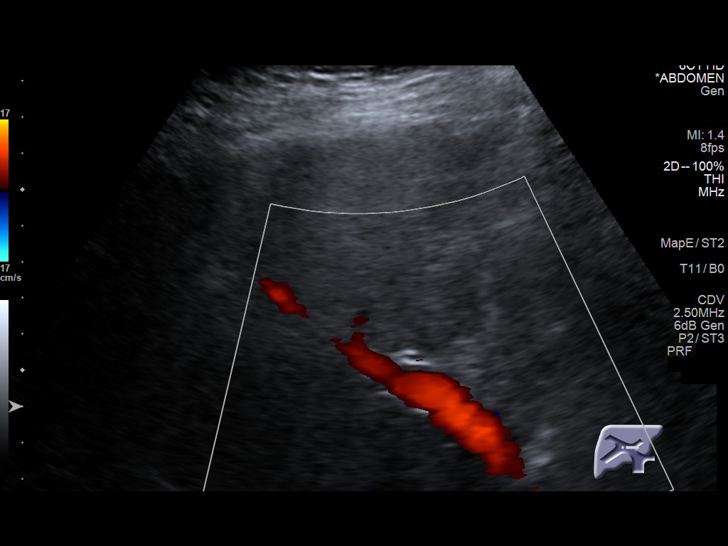
[im 73/109]
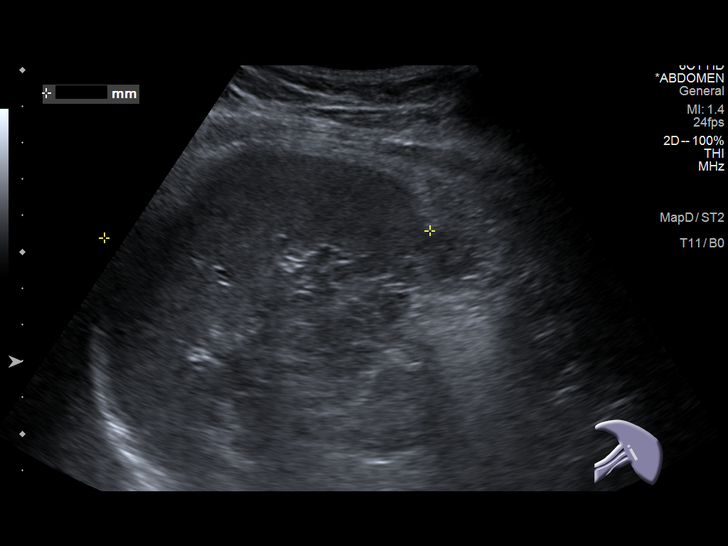
[im 82/109]
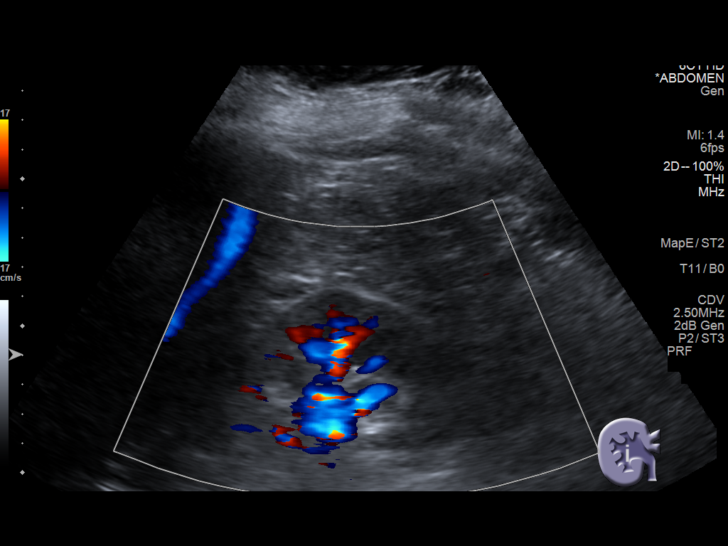
[im 91/109]
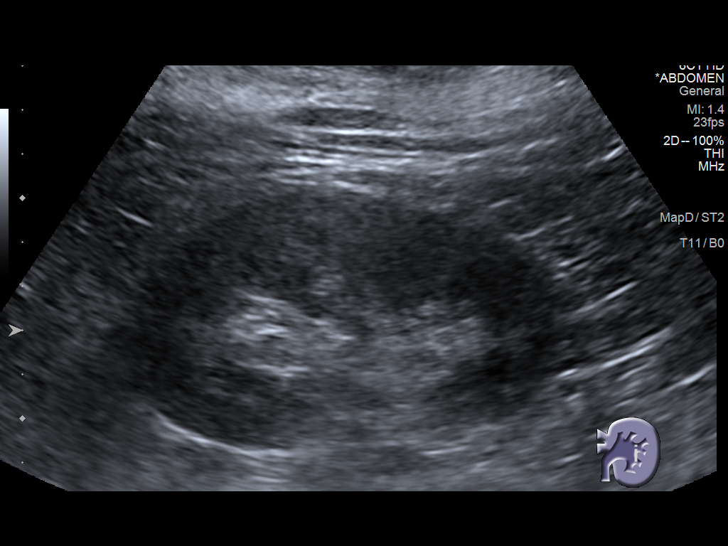
[im 100/109]
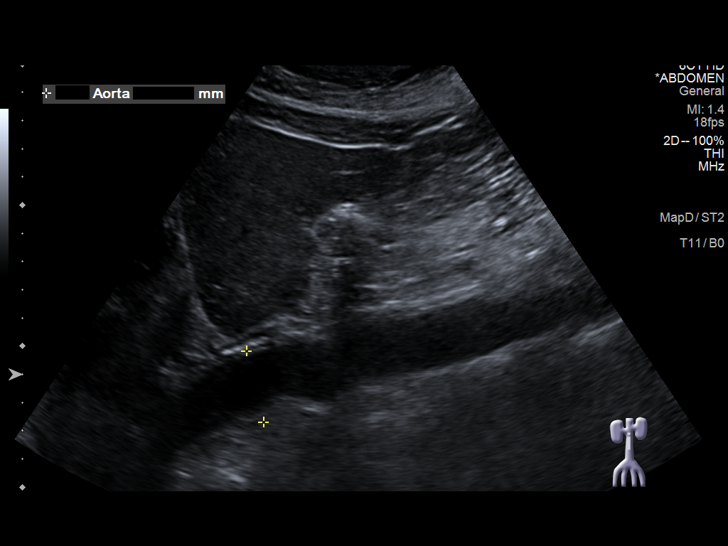
[im 109/109]
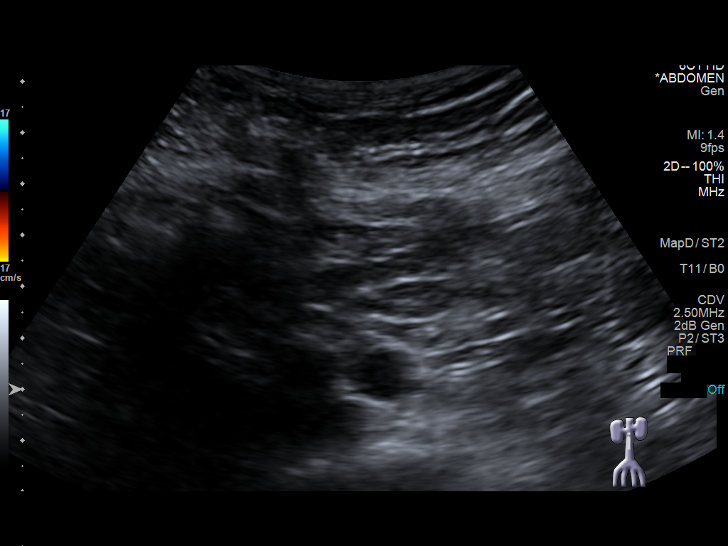

[13 of 25 positions shown; findings below may reference images not displayed]

FINDINGS: Gallbladder: No gallstones or wall thickening visualized. No
sonographic Murphy sign noted by sonographer.

Common bile duct: Diameter: 4 mm, normal

Liver: Hepatic echogenicity is mildly increased. Within the RIGHT
liver there is an 11 x 8 x 13 mm anechoic mass with posterior
acoustic enhancement consistent with a hepatic cyst. Portal vein is
patent on color Doppler imaging with normal direction of blood flow
towards the liver.

IVC: No abnormality visualized.

Pancreas: Visualized portion unremarkable.

Spleen: Size and appearance within normal limits.

Right Kidney: Length: 9.2 cm. Echogenicity within normal limits. No
mass or hydronephrosis visualized.

Left Kidney: Length: 9.8 cm. Echogenicity within normal limits. No
mass or hydronephrosis visualized.

Abdominal aorta: No aneurysm visualized. The aorta measures 2.6 cm
proximally, 2.5 cm in the midportion and 2.1 cm in the distal
portion.

Other findings: None.
IMPRESSION: 1.  No sonographic etiology for abdominal pain identified.

2. Mild increase in hepatic echogenicity may reflect underlying
hepatic steatosis.

## 2020-09-02 ENCOUNTER — Other Ambulatory Visit (HOSPITAL_COMMUNITY): Payer: Self-pay

## 2020-09-03 ENCOUNTER — Other Ambulatory Visit (HOSPITAL_COMMUNITY): Payer: Self-pay

## 2020-09-03 MED ORDER — VRAYLAR 1.5 MG PO CAPS
1.5000 mg | ORAL_CAPSULE | Freq: Every day | ORAL | 0 refills | Status: DC
  Filled 2020-09-03: qty 30, 30d supply, fill #0

## 2020-09-09 DIAGNOSIS — F1010 Alcohol abuse, uncomplicated: Secondary | ICD-10-CM | POA: Diagnosis not present

## 2020-09-09 DIAGNOSIS — F429 Obsessive-compulsive disorder, unspecified: Secondary | ICD-10-CM | POA: Diagnosis not present

## 2020-09-09 DIAGNOSIS — F319 Bipolar disorder, unspecified: Secondary | ICD-10-CM | POA: Diagnosis not present

## 2020-09-09 DIAGNOSIS — F17200 Nicotine dependence, unspecified, uncomplicated: Secondary | ICD-10-CM | POA: Diagnosis not present

## 2020-09-22 ENCOUNTER — Other Ambulatory Visit (HOSPITAL_BASED_OUTPATIENT_CLINIC_OR_DEPARTMENT_OTHER): Payer: Self-pay

## 2020-09-22 ENCOUNTER — Other Ambulatory Visit (HOSPITAL_COMMUNITY): Payer: Self-pay

## 2020-09-24 ENCOUNTER — Other Ambulatory Visit (HOSPITAL_COMMUNITY): Payer: Self-pay

## 2020-10-04 ENCOUNTER — Ambulatory Visit: Payer: 59 | Admitting: Family Medicine

## 2020-10-15 ENCOUNTER — Other Ambulatory Visit (HOSPITAL_COMMUNITY): Payer: Self-pay

## 2020-10-18 ENCOUNTER — Other Ambulatory Visit (HOSPITAL_COMMUNITY): Payer: Self-pay

## 2020-10-18 MED ORDER — PROPRANOLOL HCL 20 MG PO TABS
20.0000 mg | ORAL_TABLET | Freq: Four times a day (QID) | ORAL | 2 refills | Status: DC | PRN
  Filled 2020-10-18: qty 90, 23d supply, fill #0
  Filled 2020-11-09: qty 90, 23d supply, fill #1
  Filled 2020-12-21 – 2021-03-10 (×2): qty 90, 23d supply, fill #2

## 2020-10-18 MED ORDER — VRAYLAR 1.5 MG PO CAPS
1.5000 mg | ORAL_CAPSULE | Freq: Every day | ORAL | 0 refills | Status: DC
  Filled 2020-11-29: qty 30, 30d supply, fill #0

## 2020-10-18 MED ORDER — LAMOTRIGINE 200 MG PO TABS
200.0000 mg | ORAL_TABLET | Freq: Two times a day (BID) | ORAL | 2 refills | Status: DC
  Filled 2020-10-18: qty 60, 30d supply, fill #0
  Filled 2020-11-29: qty 60, 30d supply, fill #1

## 2020-10-18 MED ORDER — BUPROPION HCL ER (XL) 300 MG PO TB24
300.0000 mg | ORAL_TABLET | Freq: Every day | ORAL | 2 refills | Status: AC
  Filled 2020-11-29: qty 30, 30d supply, fill #0

## 2020-10-18 MED ORDER — HYDROXYZINE PAMOATE 25 MG PO CAPS
25.0000 mg | ORAL_CAPSULE | Freq: Four times a day (QID) | ORAL | 2 refills | Status: DC | PRN
  Filled 2020-10-18: qty 60, 8d supply, fill #0

## 2020-10-18 MED ORDER — METFORMIN HCL ER 500 MG PO TB24
1000.0000 mg | ORAL_TABLET | Freq: Every day | ORAL | 2 refills | Status: DC
  Filled 2020-10-18: qty 60, 30d supply, fill #0
  Filled 2020-12-23: qty 60, 30d supply, fill #1

## 2020-10-18 MED FILL — Bupropion HCl Tab ER 24HR 300 MG: ORAL | 30 days supply | Qty: 30 | Fill #0 | Status: AC

## 2020-11-05 ENCOUNTER — Encounter: Payer: Self-pay | Admitting: Plastic Surgery

## 2020-11-05 ENCOUNTER — Other Ambulatory Visit: Payer: Self-pay

## 2020-11-05 ENCOUNTER — Ambulatory Visit: Payer: 59 | Admitting: Plastic Surgery

## 2020-11-05 VITALS — BP 125/85 | HR 65 | Ht 64.0 in | Wt 223.0 lb

## 2020-11-05 DIAGNOSIS — H02834 Dermatochalasis of left upper eyelid: Secondary | ICD-10-CM | POA: Diagnosis not present

## 2020-11-05 DIAGNOSIS — H57813 Brow ptosis, bilateral: Secondary | ICD-10-CM | POA: Diagnosis not present

## 2020-11-05 DIAGNOSIS — H02831 Dermatochalasis of right upper eyelid: Secondary | ICD-10-CM

## 2020-11-05 NOTE — Progress Notes (Signed)
Referring Provider Hali Marry, MD [address] [place],  [place] [address]   CC:  Excess upper eyelid skin and visual obstruction    Charlotte Hobbs is an 50 y.o. female.  HPI: The patient is a 50 year old who notes upper eyelid skin is obstructing her vision which is worse on the right side compared to the left.  She feels like she can see her right upper lid.  This is bothersome when driving.  She is interested in bilateral upper blepharoplasty.  She does not wear glasses or contacts.  She denies any history of dry eye symptoms.  No Known Allergies  Outpatient Encounter Medications as of 11/05/2020  Medication Sig   buPROPion (WELLBUTRIN XL) 300 MG 24 hr tablet Take 1 tablet (300 mg total) by mouth daily.   cariprazine (VRAYLAR) 1.5 MG capsule Take 1 capsule (1.5 mg total) by mouth daily at bedtime   Cyanocobalamin (B-12) 500 MCG TABS Take 1 tablet by mouth daily.   hydrOXYzine (VISTARIL) 25 MG capsule Take 1-2 capsules (25-50 mg total) by mouth every 6 (six) hours as needed for anxiety   Insulin Pen Needle 32G X 4 MM MISC Use daily with Saxenda as directed   lamoTRIgine (LAMICTAL) 200 MG tablet Take 1 tablet (200 mg total) by mouth 2 (two) times daily.   Liraglutide -Weight Management (SAXENDA) 18 MG/3ML SOPN Inject 3 mg into the skin daily.   metFORMIN (GLUCOPHAGE-XR) 500 MG 24 hr tablet Take 2 tablets (1,000 mg total) by mouth daily with food   mometasone (ELOCON) 0.1 % cream APPLY 1 APPLICATION TOPICALLY DAILY.   propranolol (INDERAL) 20 MG tablet Take 1 tablet (20 mg total) by mouth every 6 (six) hours as needed for anxiety   vitamin B-12 (CYANOCOBALAMIN) 500 MCG tablet Take one tablet by mouth daily   buPROPion (WELLBUTRIN XL) 300 MG 24 hr tablet Take 300 mg by mouth daily.    buPROPion (WELLBUTRIN XL) 300 MG 24 hr tablet Take 1 tablet (300 mg total) by mouth daily.   buPROPion (WELLBUTRIN XL) 300 MG 24 hr tablet Take 1 tablet (300 mg total) by mouth  daily.   cariprazine (VRAYLAR) 1.5 MG capsule Take 1 capsule (1.5 mg total) by mouth daily at bedtime.   cariprazine (VRAYLAR) 1.5 MG capsule Take 1 capsule by mouth daily at bedtime   cariprazine (VRAYLAR) 1.5 MG capsule Take 1 capsule (1.5 mg total) by mouth daily at bedtime   metFORMIN (GLUCOPHAGE-XR) 500 MG 24 hr tablet Take 2 tablets (1,000 mg total) by mouth daily with food   pantoprazole (PROTONIX) 40 MG tablet Take 1 tablet (40 mg total) by mouth daily.   propranolol (INDERAL) 20 MG tablet Take 1 tablet by mouth every 6 hours as needed for anxiety   Semaglutide-Weight Management (WEGOVY) 2.4 MG/0.75ML SOAJ Inject 2.4 mg into the skin every 7 (seven) days.   No facility-administered encounter medications on file as of 11/05/2020.     Past Medical History:  Diagnosis Date   Bipolar disorder ([facility])    Depression    bi polar   Mania ([facility])     Past Surgical History:  Procedure Laterality Date   BACK SURGERY     CESAREAN SECTION  2002   LAPAROSCOPIC GASTRIC SLEEVE RESECTION N/A 09/16/2018   Procedure: LAPAROSCOPIC GASTRIC SLEEVE RESECTION, Upper Endo, ERAS Pathway;  Surgeon: Clovis Riley, MD;  Location: [facility];  Service: General;  Laterality: N/A;   LUMBAR LAMINECTOMY/DECOMPRESSION MICRODISCECTOMY  11/13/2011  Procedure: LUMBAR LAMINECTOMY/DECOMPRESSION MICRODISCECTOMY 2 LEVELS;  Surgeon: Ophelia Charter, MD;  Location: [facility];  Service: Neurosurgery;  Laterality: Right;  RIGHT Lumbar four-five Lumbar five sacral one diskectomy   MYOMECTOMY  1996    Family History  Problem Relation Age of Onset   Hypertension Father    Heart attack Father 25   Anxiety disorder Father    Heart attack Paternal Grandfather 70   Heart disease Maternal Grandmother     Social History   Social History Narrative   No regular exercise.  Divorced.   Sometimes smokes tobacco   Review of Systems General: Denies fevers, chills, weight loss CV: Denies chest pain, shortness of breath,  palpitations  Physical Exam Vitals with BMI 11/05/2020 05/27/2020 05/11/2020  Height 5\' 4"  5\' 4"  5\' 4"   Weight 223 lbs 204 lbs 213 lbs  BMI 92.95 35 74.73  Systolic [phone]  Diastolic 85 99 78  Pulse 65 86 78  Some encounter information is confidential and restricted. Go to Review Flowsheets activity to see all data.    General:  No acute distress,  Alert and oriented, Non-Toxic, Normal speech and affect HEENT.  Patient has significant dermatochalasis bilaterally.  She has eyelid skin resting on her lashes bilateral.  Right is more severe than the left.  Her brows are below the orbital rim bilaterally.  She has minimal eyelid ptosis and her MRD is 3 mm bilaterally  Assessment/Plan The patient is a likely candidate for bilateral upper blepharoplasty with possible brow lift which would be endobrow versus transblepharoplasty brow lift.  She had not consider brow lift before.  I would consider blepharoplasty only if that is her preference but if this does not give her the desired functional result then I would consider a staged brow lift.  We need to get a taped and untaped visual field test as the next step.  I will see her back to review visual field test result.  I would prefer that she completely abstain from tobacco prior to surgery.  Time based coding: 20 minutes were spent with the patient.  Greater than 50% was spent on counseling cordination of care.     Lennice Sites 11/05/2020, 2:08 PM

## 2020-11-09 ENCOUNTER — Other Ambulatory Visit (HOSPITAL_COMMUNITY): Payer: Self-pay

## 2020-11-18 ENCOUNTER — Other Ambulatory Visit (HOSPITAL_COMMUNITY): Payer: Self-pay

## 2020-11-18 ENCOUNTER — Ambulatory Visit: Payer: 59 | Admitting: Family Medicine

## 2020-11-18 ENCOUNTER — Encounter: Payer: Self-pay | Admitting: Family Medicine

## 2020-11-18 ENCOUNTER — Other Ambulatory Visit: Payer: Self-pay

## 2020-11-18 VITALS — BP 146/76 | HR 79 | Ht 64.0 in | Wt 218.0 lb

## 2020-11-18 DIAGNOSIS — Z9884 Bariatric surgery status: Secondary | ICD-10-CM | POA: Diagnosis not present

## 2020-11-18 DIAGNOSIS — I10 Essential (primary) hypertension: Secondary | ICD-10-CM

## 2020-11-18 DIAGNOSIS — R9431 Abnormal electrocardiogram [ECG] [EKG]: Secondary | ICD-10-CM

## 2020-11-18 DIAGNOSIS — Z903 Acquired absence of stomach [part of]: Secondary | ICD-10-CM | POA: Diagnosis not present

## 2020-11-18 MED ORDER — LOSARTAN POTASSIUM 25 MG PO TABS
25.0000 mg | ORAL_TABLET | Freq: Every day | ORAL | 1 refills | Status: AC
Start: 2020-11-18 — End: ?
  Filled 2020-11-18: qty 90, 90d supply, fill #0

## 2020-11-18 NOTE — Assessment & Plan Note (Signed)
Will check for anemia today.

## 2020-11-18 NOTE — Progress Notes (Signed)
 Acute Office Visit  Subjective:    Patient ID: Charlotte Hobbs, female    DOB: 01/31/1970, 50 y.o.   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  She had this performed about a week ago.  She had a normal echo and stress test in 2020.    She also feels her BP has been gradually going up over the lst year.  No recent CP. Occ feel SOB but not new.    Past Medical History:  Diagnosis Date   Bipolar disorder ([facility])    Depression    bi polar   Mania ([facility])     Past Surgical History:  Procedure Laterality Date   BACK SURGERY     CESAREAN SECTION  2002   LAPAROSCOPIC GASTRIC SLEEVE RESECTION N/A 09/16/2018   Procedure: LAPAROSCOPIC GASTRIC SLEEVE RESECTION, Upper Endo, ERAS Pathway;  Surgeon: Connor, Chelsea A, MD;  Location: [facility];  Service: General;  Laterality: N/A;   LUMBAR LAMINECTOMY/DECOMPRESSION MICRODISCECTOMY  11/13/2011   Procedure: LUMBAR LAMINECTOMY/DECOMPRESSION MICRODISCECTOMY 2 LEVELS;  Surgeon: Jeffrey D Jenkins, MD;  Location: [facility];  Service: Neurosurgery;  Laterality: Right;  RIGHT Lumbar four-five Lumbar five sacral one diskectomy   MYOMECTOMY  1996    Family History  Problem Relation Age of Onset   Hypertension Father    Heart attack Father 33   Anxiety disorder Father    Heart attack Paternal Grandfather 40   Heart disease Maternal Grandmother     Social History   Socioeconomic History   Marital status: Divorced    Spouse name: Not on file   Number of children: 1   Years of education: Not on file   Highest education level: Not on file  Occupational History   Occupation: Nuclear Med Imaging tech    Employer: [employer]  Tobacco Use   Smoking status: Some Days    Packs/day: 0.50    Years: 25.00    Pack years: 12.50     Types: E-cigarettes, Cigarettes    Last attempt to quit: 03/2018    Years since quitting: 2.6   Smokeless tobacco: Never  Vaping Use   Vaping Use: Never used  Substance and Sexual Activity   Alcohol use: Not Currently    Alcohol/week: 0.0 standard drinks   Drug use: No   Sexual activity: Not Currently    Birth control/protection: Condom  Other Topics Concern   Not on file  Social History Narrative   No regular exercise.  Divorced.    Social Determinants of Health   Financial Resource Strain: Not on file  Food Insecurity: Not on file  Transportation Needs: Not on file  Physical Activity: Not on file  Stress: Not on file  Social Connections: Not on file  Intimate Partner Violence: Not on file    Outpatient Medications Prior to Visit  Medication Sig Dispense Refill   buPROPion (WELLBUTRIN XL) 300 MG 24 hr tablet Take 1 tablet (300 mg total) by mouth daily. 30 tablet 2   cariprazine (VRAYLAR) 1.5 MG capsule Take 1 capsule (1.5 mg total) by mouth daily at bedtime 30 capsule 0   hydrOXYzine (VISTARIL) 25 MG capsule Take 1-2 capsules (25-50 mg total) by mouth every 6 (six) hours as needed for anxiety   60 capsule 2   lamoTRIgine (LAMICTAL) 200 MG tablet Take 1 tablet (200 mg total) by mouth 2 (two) times daily. 60 tablet 2   metFORMIN (GLUCOPHAGE-XR) 500 MG 24 hr tablet Take 2 tablets (1,000 mg total) by mouth daily with food 60 tablet 2   propranolol (INDERAL) 20 MG tablet Take 1 tablet (20 mg total) by mouth every 6 (six) hours as needed for anxiety 90 tablet 2   vitamin B-12 (CYANOCOBALAMIN) 500 MCG tablet Take one tablet by mouth daily 30 tablet 2   buPROPion (WELLBUTRIN XL) 300 MG 24 hr tablet Take 300 mg by mouth daily.      buPROPion (WELLBUTRIN XL) 300 MG 24 hr tablet Take 1 tablet (300 mg total) by mouth daily. 30 tablet 2   buPROPion (WELLBUTRIN XL) 300 MG 24 hr tablet Take 1 tablet (300 mg total) by mouth daily. 30 tablet 2   cariprazine (VRAYLAR) 1.5 MG capsule Take 1  capsule by mouth daily at bedtime 30 capsule 2   cariprazine (VRAYLAR) 1.5 MG capsule Take 1 capsule (1.5 mg total) by mouth daily at bedtime 30 capsule 2   cariprazine (VRAYLAR) 1.5 MG capsule Take 1 capsule (1.5 mg total) by mouth daily at bedtime. 30 capsule 0   Cyanocobalamin (B-12) 500 MCG TABS Take 1 tablet by mouth daily. 30 tablet 2   Insulin Pen Needle 32G X 4 MM MISC Use daily with Saxenda as directed 100 each 0   Liraglutide -Weight Management (SAXENDA) 18 MG/3ML SOPN Inject 3 mg into the skin daily. 30 mL 1   metFORMIN (GLUCOPHAGE-XR) 500 MG 24 hr tablet Take 2 tablets (1,000 mg total) by mouth daily with food 60 tablet 2   mometasone (ELOCON) 0.1 % cream APPLY 1 APPLICATION TOPICALLY DAILY. 45 g 0   pantoprazole (PROTONIX) 40 MG tablet Take 1 tablet (40 mg total) by mouth daily. 90 tablet 0   propranolol (INDERAL) 20 MG tablet Take 1 tablet by mouth every 6 hours as needed for anxiety 90 tablet 2   Semaglutide-Weight Management (WEGOVY) 2.4 MG/0.75ML SOAJ Inject 2.4 mg into the skin every 7 (seven) days. 3 mL 0   No facility-administered medications prior to visit.    No Known Allergies  Review of Systems     Objective:    Physical Exam Constitutional:      Appearance: She is well-developed.  HENT:     Head: Normocephalic and atraumatic.  Cardiovascular:     Rate and Rhythm: Normal rate and regular rhythm.     Heart sounds: Normal heart sounds.  Pulmonary:     Effort: Pulmonary effort is normal.     Breath sounds: Normal breath sounds.  Skin:    General: Skin is warm and dry.  Neurological:     Mental Status: She is alert and oriented to person, place, and time.  Psychiatric:        Behavior: Behavior normal.    BP (!) 146/76   Pulse 79   Ht 5' 4" (1.626 m)   Wt 218 lb (98.9 kg)   SpO2 100%   BMI 37.42 kg/m  Wt Readings from Last 3 Encounters:  11/18/20 218 lb (98.9 kg)  11/05/20 223 lb (101.2 kg)  05/27/20 204 lb (92.5 kg)    Health Maintenance Due   Topic Date Due   Pneumococcal Vaccine 19-64 Years old (1 - PCV) Never done   HIV Screening  Never done   Hepatitis C Screening  Never done   Zoster   Vaccines- Shingrix (1 of 2) Never done   COLONOSCOPY (Pts 45-84yr Insurance coverage will need to be confirmed)  Never done   COVID-19 Vaccine (3 - Pfizer risk series) 03/11/2019   TETANUS/TDAP  07/17/2019   MAMMOGRAM  08/12/2020    There are no preventive care reminders to display for this patient.   Lab Results  Component Value Date   TSH 0.90 01/01/2018   Lab Results  Component Value Date   WBC 4.6 08/14/2019   HGB 14.3 08/14/2019   HCT 41.2 08/14/2019   MCV 93.6 08/14/2019   PLT 237 08/14/2019   Lab Results  Component Value Date   NA 142 08/14/2019   K 3.7 08/14/2019   CO2 25 08/14/2019   GLUCOSE 101 08/14/2019   BUN 12 08/14/2019   CREATININE 0.93 08/14/2019   BILITOT 0.4 08/14/2019   ALKPHOS 53 09/17/2018   AST 13 08/14/2019   ALT 16 08/14/2019   PROT 6.2 08/14/2019   ALBUMIN 3.4 (L) 09/17/2018   CALCIUM 8.7 08/14/2019   ANIONGAP 9 09/18/2018   Lab Results  Component Value Date   CHOL 174 11/17/2013   Lab Results  Component Value Date   HDL 46 11/17/2013   Lab Results  Component Value Date   LDLCALC 107 (H) 11/17/2013   Lab Results  Component Value Date   TRIG 107 11/17/2013   Lab Results  Component Value Date   CHOLHDL 3.8 11/17/2013   Lab Results  Component Value Date   HGBA1C 5.1 01/01/2018       Assessment & Plan:   Problem List Items Addressed This Visit       Cardiovascular and Mediastinum   Essential hypertension    BP not at goal.  Will start losartan 278m F/U in 2 week to recheck at nurse visit. Check BMP at hat time. Will get labs updated today.        Relevant Medications   losartan (COZAAR) 25 MG tablet   Other Relevant Orders   Lipid Panel w/reflex Direct LDL   COMPLETE METABOLIC PANEL WITH GFR   CBC   TSH     Other   Status post sleeve gastrectomy    Will need  to monitor vitamin and nutrient levels yearly       S/P bariatric surgery    Will check for anemia today.       Abnormal finding on EKG - Primary    EKG today shows rate of 78 bpm, NSR with inverted T wave in V3.  Minimal  change since old EKG in 2020 tht also showed mild inverstion in V3.  Likely stable.  Asymptomatic.        Relevant Orders   EKG 12-Lead   Lipid Panel w/reflex Direct LDL   COMPLETE METABOLIC PANEL WITH GFR   CBC   TSH   Other Visit Diagnoses     Abnormal EKG            Meds ordered this encounter  Medications   losartan (COZAAR) 25 MG tablet    Sig: Take 1 tablet (25 mg total) by mouth daily.    Dispense:  90 tablet    Refill:  1      CaBeatrice LecherMD

## 2020-11-18 NOTE — Progress Notes (Signed)
Pt went to [place] and had a D[place]al switch.   She states that her BP has been elevated for some time and before she left they performed an EKG

## 2020-11-18 NOTE — Assessment & Plan Note (Signed)
Will need to monitor vitamin and nutrient levels yearly

## 2020-11-18 NOTE — Assessment & Plan Note (Signed)
EKG today shows rate of 78 bpm, NSR with inverted T wave in V3.  Minimal  change since old EKG in 2020 tht also showed mild inverstion in V3.  Likely stable.  Asymptomatic.

## 2020-11-18 NOTE — Assessment & Plan Note (Signed)
BP not at goal.  Will start losartan 25mg . F/U in 2 week to recheck at nurse visit. Check BMP at hat time. Will get labs updated today.

## 2020-11-19 LAB — CBC
HCT: 38.4 % (ref 35.0–45.0)
Hemoglobin: 12.9 g/dL (ref 11.7–15.5)
MCH: 30.8 pg (ref 27.0–33.0)
MCHC: 33.6 g/dL (ref 32.0–36.0)
MCV: 91.6 fL (ref 80.0–100.0)
MPV: 9.6 fL (ref 7.5–12.5)
Platelets: 278 10*3/uL (ref 140–400)
RBC: 4.19 10*6/uL (ref 3.80–5.10)
RDW: 12.7 % (ref 11.0–15.0)
WBC: 6.2 10*3/uL (ref 3.8–10.8)

## 2020-11-19 LAB — LIPID PANEL W/REFLEX DIRECT LDL
Cholesterol: 179 mg/dL (ref <200)
HDL: 31 mg/dL — ABNORMAL LOW (ref >=50)
LDL Cholesterol (Calc): 121 mg/dL (calc) — ABNORMAL HIGH
Non-HDL Cholesterol (Calc): 148 mg/dL (calc) — ABNORMAL HIGH (ref <130)
Total CHOL/HDL Ratio: 5.8 (calc) — ABNORMAL HIGH (ref <5.0)
Triglycerides: 156 mg/dL — ABNORMAL HIGH (ref <150)

## 2020-11-19 LAB — COMPLETE METABOLIC PANEL WITH GFR
AG Ratio: 1.5 (calc) (ref 1.0–2.5)
ALT: 17 U/L (ref 6–29)
AST: 17 U/L (ref 10–35)
Albumin: 3.9 g/dL (ref 3.6–5.1)
Alkaline phosphatase (APISO): 65 U/L (ref 37–153)
BUN: 14 mg/dL (ref 7–25)
CO2: 27 mmol/L (ref 20–32)
Calcium: 8.7 mg/dL (ref 8.6–10.4)
Chloride: 103 mmol/L (ref 98–110)
Creat: 0.69 mg/dL (ref 0.50–1.03)
Globulin: 2.6 g/dL (calc) (ref 1.9–3.7)
Glucose, Bld: 83 mg/dL (ref 65–99)
Potassium: 4 mmol/L (ref 3.5–5.3)
Sodium: 139 mmol/L (ref 135–146)
Total Bilirubin: 0.6 mg/dL (ref 0.2–1.2)
Total Protein: 6.5 g/dL (ref 6.1–8.1)
eGFR: 106 mL/min/{1.73_m2} (ref >=60)

## 2020-11-19 LAB — TSH: TSH: 1.79 mIU/L

## 2020-11-19 NOTE — Progress Notes (Signed)
Hi Shannon, LDL cholesterol is elevated at 121.  Just continue to work on healthy food choices and trying to increase her activity level as you are healing.  Lab to check this again in 6 months to see if we can bring it down.  Kidney and liver function look great.  Hemoglobin also looks great.  No sign of anemia which is fantastic especially right after surgery.  Thyroid is normal.

## 2020-11-29 ENCOUNTER — Other Ambulatory Visit (HOSPITAL_COMMUNITY): Payer: Self-pay

## 2020-12-02 ENCOUNTER — Other Ambulatory Visit: Payer: Self-pay

## 2020-12-02 ENCOUNTER — Ambulatory Visit: Payer: 59

## 2020-12-02 DIAGNOSIS — I10 Essential (primary) hypertension: Secondary | ICD-10-CM

## 2020-12-02 NOTE — Progress Notes (Unsigned)
Ordered labs per last office note.

## 2020-12-15 ENCOUNTER — Other Ambulatory Visit (HOSPITAL_COMMUNITY): Payer: Self-pay

## 2020-12-15 DIAGNOSIS — F319 Bipolar disorder, unspecified: Secondary | ICD-10-CM | POA: Diagnosis not present

## 2020-12-15 DIAGNOSIS — F17200 Nicotine dependence, unspecified, uncomplicated: Secondary | ICD-10-CM | POA: Diagnosis not present

## 2020-12-15 DIAGNOSIS — F429 Obsessive-compulsive disorder, unspecified: Secondary | ICD-10-CM | POA: Diagnosis not present

## 2020-12-15 DIAGNOSIS — F1010 Alcohol abuse, uncomplicated: Secondary | ICD-10-CM | POA: Diagnosis not present

## 2020-12-15 MED ORDER — BUPROPION HCL ER (SR) 150 MG PO TB12
150.0000 mg | ORAL_TABLET | Freq: Two times a day (BID) | ORAL | 2 refills | Status: AC
  Filled 2020-12-15: qty 60, 30d supply, fill #0
  Filled 2021-02-11: qty 60, 30d supply, fill #1

## 2020-12-15 MED ORDER — VRAYLAR 1.5 MG PO CAPS
1.5000 mg | ORAL_CAPSULE | ORAL | 2 refills | Status: AC
  Filled 2020-12-15 – 2021-01-13 (×2): qty 30, 30d supply, fill #0
  Filled 2021-02-11: qty 30, 30d supply, fill #1

## 2020-12-15 MED ORDER — HYDROXYZINE PAMOATE 25 MG PO CAPS
25.0000 mg | ORAL_CAPSULE | Freq: Four times a day (QID) | ORAL | 2 refills | Status: AC | PRN
  Filled 2020-12-15: qty 60, 8d supply, fill #0

## 2020-12-15 MED ORDER — PROPRANOLOL HCL 20 MG PO TABS
20.0000 mg | ORAL_TABLET | Freq: Four times a day (QID) | ORAL | 2 refills | Status: AC | PRN
  Filled 2020-12-15: qty 90, 23d supply, fill #0
  Filled 2021-01-19: qty 90, 23d supply, fill #1
  Filled 2021-02-11: qty 90, 23d supply, fill #2

## 2020-12-15 MED ORDER — LAMOTRIGINE 200 MG PO TABS
200.0000 mg | ORAL_TABLET | Freq: Two times a day (BID) | ORAL | 2 refills | Status: AC
  Filled 2020-12-15 – 2021-02-11 (×2): qty 60, 30d supply, fill #0

## 2020-12-21 ENCOUNTER — Other Ambulatory Visit (HOSPITAL_COMMUNITY): Payer: Self-pay

## 2020-12-23 ENCOUNTER — Other Ambulatory Visit (HOSPITAL_COMMUNITY): Payer: Self-pay

## 2020-12-23 MED ORDER — METFORMIN HCL 500 MG PO TABS
500.0000 mg | ORAL_TABLET | Freq: Two times a day (BID) | ORAL | 2 refills | Status: AC
  Filled 2020-12-23: qty 60, 30d supply, fill #0
  Filled 2021-02-11: qty 60, 30d supply, fill #1

## 2021-01-14 ENCOUNTER — Other Ambulatory Visit (HOSPITAL_COMMUNITY): Payer: Self-pay

## 2021-01-19 ENCOUNTER — Other Ambulatory Visit (HOSPITAL_COMMUNITY): Payer: Self-pay

## 2021-02-11 ENCOUNTER — Encounter: Payer: Self-pay | Admitting: Medical-Surgical

## 2021-02-11 ENCOUNTER — Ambulatory Visit: Payer: 59 | Admitting: Medical-Surgical

## 2021-02-11 ENCOUNTER — Other Ambulatory Visit (HOSPITAL_COMMUNITY): Payer: Self-pay

## 2021-02-11 ENCOUNTER — Other Ambulatory Visit: Payer: Self-pay

## 2021-02-11 VITALS — BP 138/86 | HR 70 | Resp 20 | Ht 64.0 in | Wt 189.0 lb

## 2021-02-11 DIAGNOSIS — R1011 Right upper quadrant pain: Secondary | ICD-10-CM | POA: Diagnosis not present

## 2021-02-11 DIAGNOSIS — M546 Pain in thoracic spine: Secondary | ICD-10-CM | POA: Diagnosis not present

## 2021-02-11 NOTE — Progress Notes (Signed)
°  HPI with pertinent ROS:   CC: Right back pain/right upper quadrant pain  HPI: Charlotte Hobbs 51 year old female presenting today with complaints of waking up yesterday morning with mid back pain that radiated to the right side and around to the upper quadrant.  This lasted for approximately 2 hours before finally subsiding as she fell asleep.  When she woke up the pain was completely gone.  She drank coffee during the pain which made her symptoms worse but did not find any alleviating symptoms.  She has never had anything like this before although she notes she did have weight loss surgery in [place] approximately 10 weeks ago.  Denies nausea, vomiting, fever, chills, shortness of breath, chest pain, right shoulder pain, constipation, diarrhea, melena, hematochezia, and hematemesis.  I reviewed the past medical history, family history, social history, surgical history, and allergies today and no changes were needed.  Please see the problem list section below in epic for further details.   Physical exam:   General: Well Developed, well nourished, and in no acute distress.  Neuro: Alert and oriented x3.  HEENT: Normocephalic, atraumatic.  Skin: Warm and dry. Cardiac: Regular rate and rhythm, no murmurs rubs or gallops, no lower extremity edema.  Respiratory: Clear to auscultation bilaterally. Not using accessory muscles, speaking in full sentences. Abdomen: Soft, nontender, nondistended. Bowel sounds + x 4 quadrants. No HSM appreciated.  Negative Murphy sign.  Impression and Recommendations:    1. Right upper quadrant pain 2. Acute right-sided thoracic back pain Unclear etiology.  Eating and drinking normally with normal bowel movements so little concern for small bowel obstruction.  We will evaluate for possible gallbladder issues.  Checking labs including CBC, CMP, amylase, and lipase.  She would like to defer on the right upper quadrant ultrasound today but if the pain returns, she will be more  aggressive with investigation.  Currently asymptomatic so no further intervention.  Return for Follow-up with PCP in 3 to 4 months; will need labs to check vitamin levels. ___________________________________________ Clearnce Sorrel, DNP, APRN, FNP-BC [facility] [place]

## 2021-02-12 LAB — CBC WITH DIFFERENTIAL/PLATELET
Absolute Monocytes: 442 cells/uL (ref 200–950)
Basophils Absolute: 9 cells/uL (ref 0–200)
Basophils Relative: 0.2 %
Eosinophils Absolute: 99 cells/uL (ref 15–500)
Eosinophils Relative: 2.1 %
HCT: 38.6 % (ref 35.0–45.0)
Hemoglobin: 12.6 g/dL (ref 11.7–15.5)
Lymphs Abs: 1344 cells/uL (ref 850–3900)
MCH: 30.6 pg (ref 27.0–33.0)
MCHC: 32.6 g/dL (ref 32.0–36.0)
MCV: 93.7 fL (ref 80.0–100.0)
MPV: 11.2 fL (ref 7.5–12.5)
Monocytes Relative: 9.4 %
Neutro Abs: 2806 cells/uL (ref 1500–7800)
Neutrophils Relative %: 59.7 %
Platelets: 251 10*3/uL (ref 140–400)
RBC: 4.12 10*6/uL (ref 3.80–5.10)
RDW: 13.8 % (ref 11.0–15.0)
Total Lymphocyte: 28.6 %
WBC: 4.7 10*3/uL (ref 3.8–10.8)

## 2021-02-12 LAB — COMPLETE METABOLIC PANEL WITH GFR
AG Ratio: 1.8 (calc) (ref 1.0–2.5)
ALT: 29 U/L (ref 6–29)
AST: 27 U/L (ref 10–35)
Albumin: 4.1 g/dL (ref 3.6–5.1)
Alkaline phosphatase (APISO): 82 U/L (ref 37–153)
BUN: 16 mg/dL (ref 7–25)
CO2: 30 mmol/L (ref 20–32)
Calcium: 8.8 mg/dL (ref 8.6–10.4)
Chloride: 110 mmol/L (ref 98–110)
Creat: 0.75 mg/dL (ref 0.50–1.03)
Globulin: 2.3 g/dL (calc) (ref 1.9–3.7)
Glucose, Bld: 87 mg/dL (ref 65–99)
Potassium: 3.9 mmol/L (ref 3.5–5.3)
Sodium: 146 mmol/L (ref 135–146)
Total Bilirubin: 0.4 mg/dL (ref 0.2–1.2)
Total Protein: 6.4 g/dL (ref 6.1–8.1)
eGFR: 97 mL/min/{1.73_m2} (ref >=60)

## 2021-02-12 LAB — LIPASE: Lipase: 41 U/L (ref 7–60)

## 2021-02-12 LAB — AMYLASE: Amylase: 30 U/L (ref 21–101)

## 2021-03-10 ENCOUNTER — Other Ambulatory Visit (HOSPITAL_COMMUNITY): Payer: Self-pay

## 2021-03-25 DIAGNOSIS — F319 Bipolar disorder, unspecified: Secondary | ICD-10-CM | POA: Diagnosis not present

## 2021-03-25 DIAGNOSIS — F429 Obsessive-compulsive disorder, unspecified: Secondary | ICD-10-CM | POA: Diagnosis not present

## 2021-03-25 DIAGNOSIS — F17200 Nicotine dependence, unspecified, uncomplicated: Secondary | ICD-10-CM | POA: Diagnosis not present

## 2021-03-28 ENCOUNTER — Other Ambulatory Visit (HOSPITAL_COMMUNITY): Payer: Self-pay

## 2021-03-28 MED ORDER — VRAYLAR 1.5 MG PO CAPS
1.5000 mg | ORAL_CAPSULE | Freq: Every day | ORAL | 2 refills | Status: AC
  Filled 2021-03-28: qty 30, 30d supply, fill #0
  Filled 2021-05-26: qty 30, 30d supply, fill #1
  Filled 2021-07-07: qty 30, 30d supply, fill #2

## 2021-03-28 MED ORDER — LAMOTRIGINE 200 MG PO TABS
200.0000 mg | ORAL_TABLET | Freq: Two times a day (BID) | ORAL | 2 refills | Status: AC
  Filled 2021-03-28: qty 60, 30d supply, fill #0
  Filled 2021-05-26: qty 60, 30d supply, fill #1
  Filled 2021-06-20: qty 60, 30d supply, fill #2

## 2021-03-28 MED ORDER — BUPROPION HCL ER (SR) 150 MG PO TB12
150.0000 mg | ORAL_TABLET | Freq: Two times a day (BID) | ORAL | 2 refills | Status: AC
  Filled 2021-03-28: qty 60, 30d supply, fill #0
  Filled 2021-06-20: qty 60, 30d supply, fill #1

## 2021-03-28 MED ORDER — METFORMIN HCL 500 MG PO TABS
500.0000 mg | ORAL_TABLET | Freq: Two times a day (BID) | ORAL | 2 refills | Status: AC
  Filled 2021-03-28: qty 60, 30d supply, fill #0
  Filled 2021-06-20: qty 60, 30d supply, fill #1

## 2021-03-28 MED ORDER — PROPRANOLOL HCL 20 MG PO TABS
20.0000 mg | ORAL_TABLET | Freq: Four times a day (QID) | ORAL | 2 refills | Status: DC | PRN
  Filled 2021-03-28: qty 90, 23d supply, fill #0
  Filled 2021-04-21: qty 90, 23d supply, fill #1
  Filled 2021-05-26: qty 90, 23d supply, fill #2

## 2021-04-21 ENCOUNTER — Other Ambulatory Visit (HOSPITAL_COMMUNITY): Payer: Self-pay

## 2021-05-26 ENCOUNTER — Other Ambulatory Visit (HOSPITAL_COMMUNITY): Payer: Self-pay

## 2021-06-10 IMAGING — US US PELVIS COMPLETE WITH TRANSVAGINAL
1 series · 14 of 25 positions shown · non-contrast
Comparison: None

CLINICAL DATA: Urinary frequency

EXAM:
TRANSABDOMINAL AND TRANSVAGINAL ULTRASOUND OF PELVIS
TECHNIQUE: Both transabdominal and transvaginal ultrasound examinations of the
pelvis were performed. Transabdominal technique was performed for
global imaging of the pelvis including uterus, ovaries, adnexal
regions, and pelvic cul-de-sac. It was necessary to proceed with
endovaginal exam following the transabdominal exam to visualize the
uterus endometrium ovaries.

[Series 1: us pelvic complete with transvaginal · 14 of 67 slices shown]
[im 1/67]
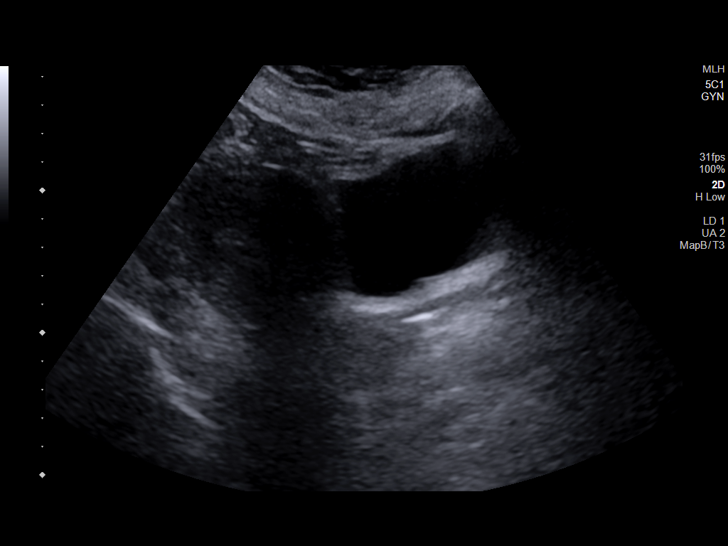
[im 6/67]
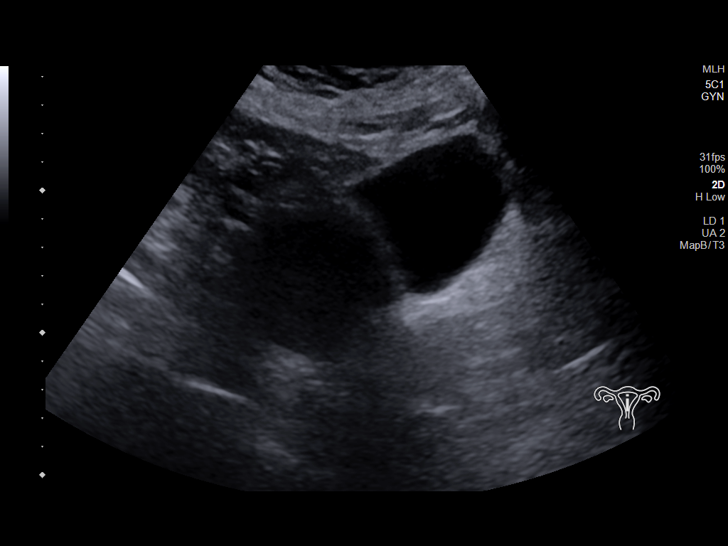
[im 12/67]
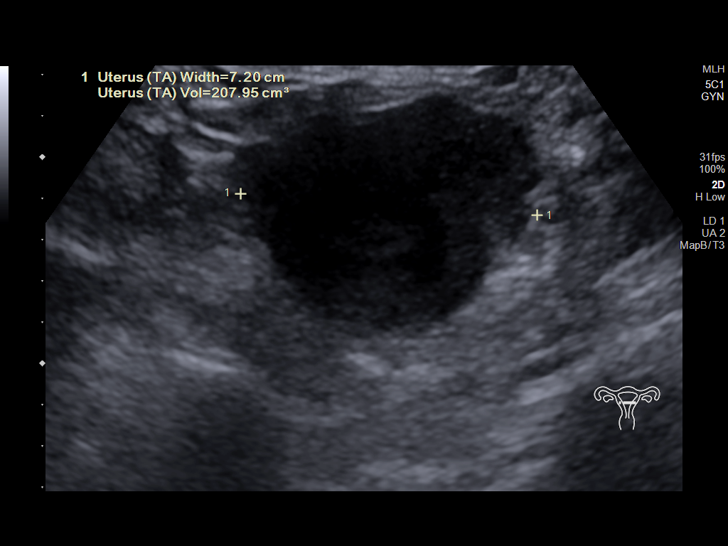
[im 17/67]
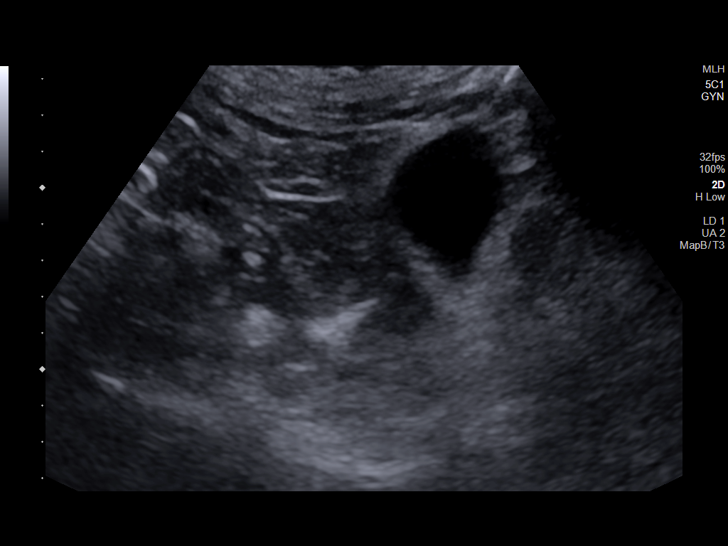
[im 23/67]
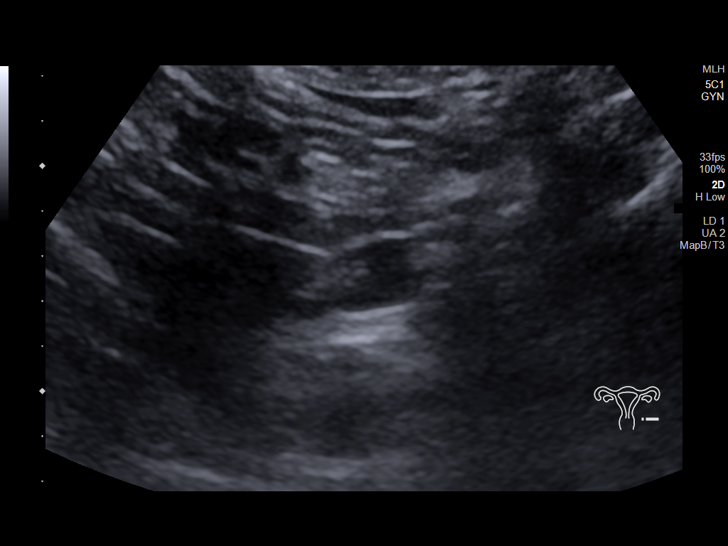
[im 25/67]
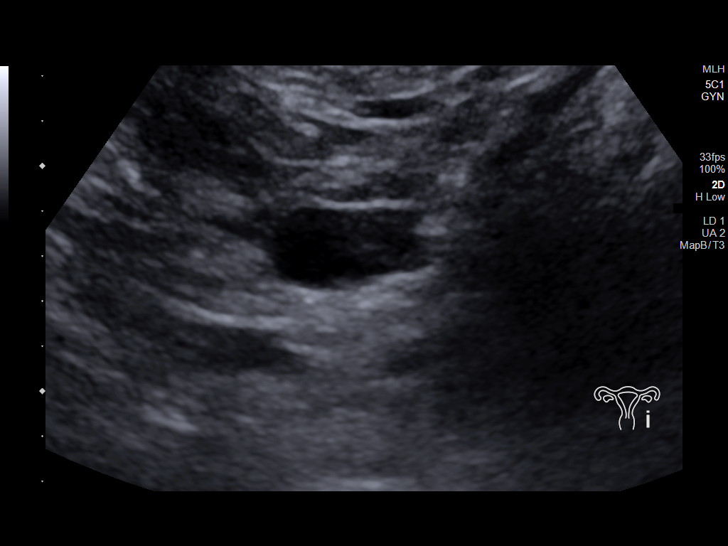
[im 31/67]
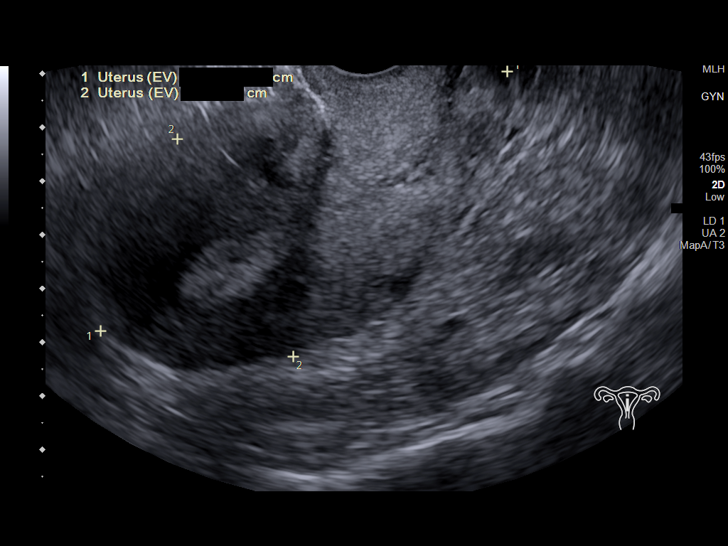
[im 36/67]
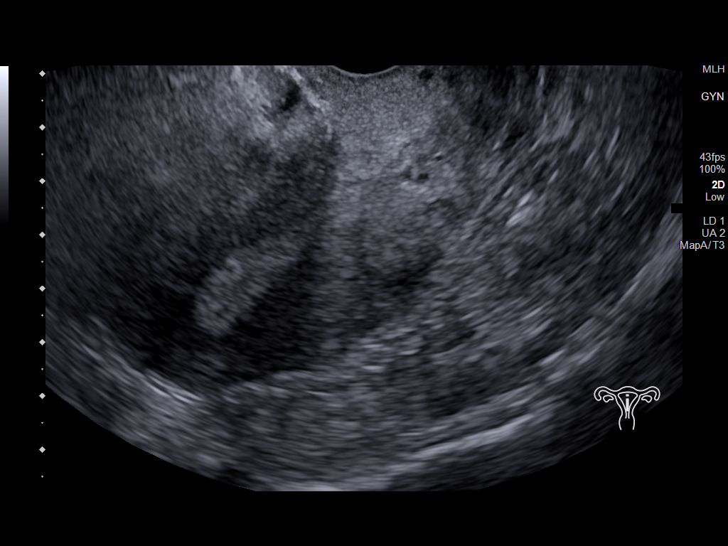
[im 42/67]
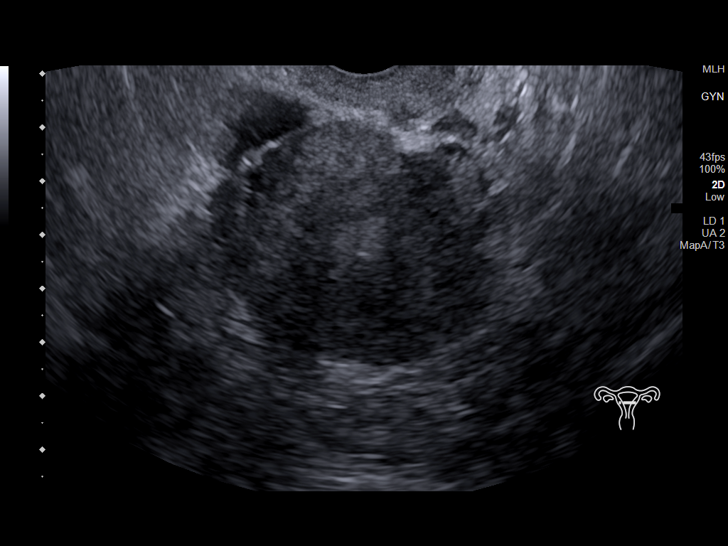
[im 45/67]
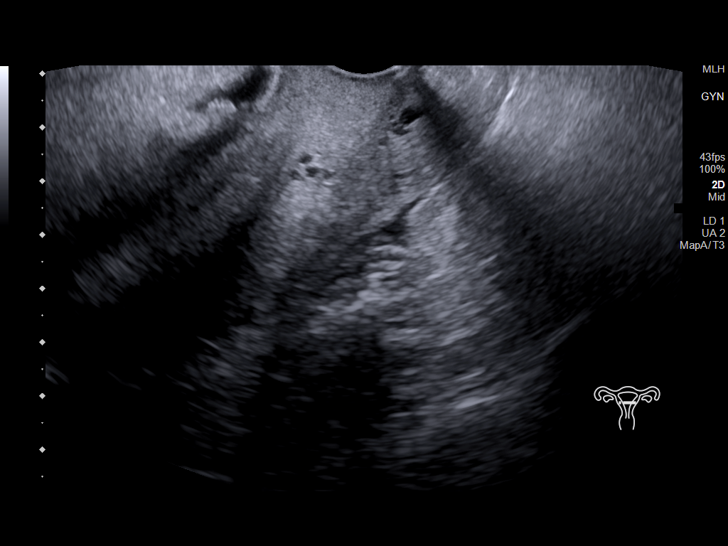
[im 50/67]
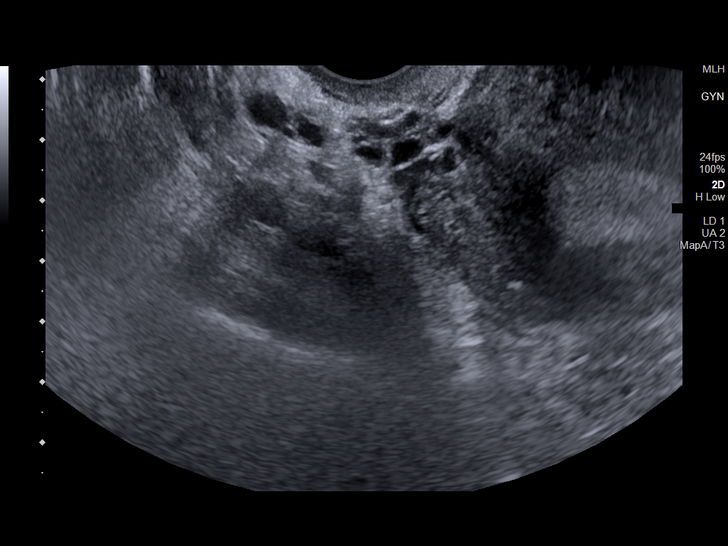
[im 56/67]
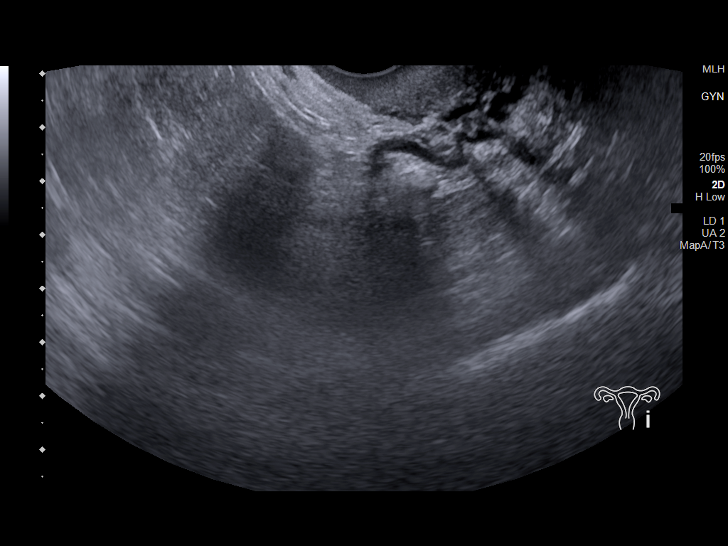
[im 61/67]
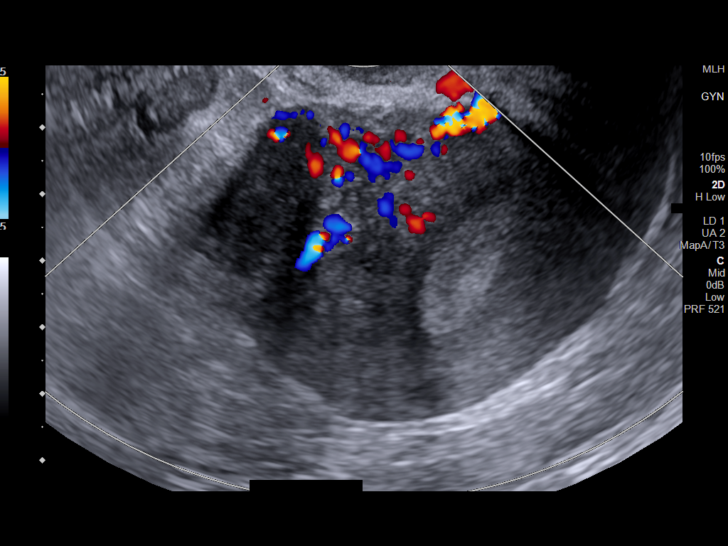
[im 67/67]
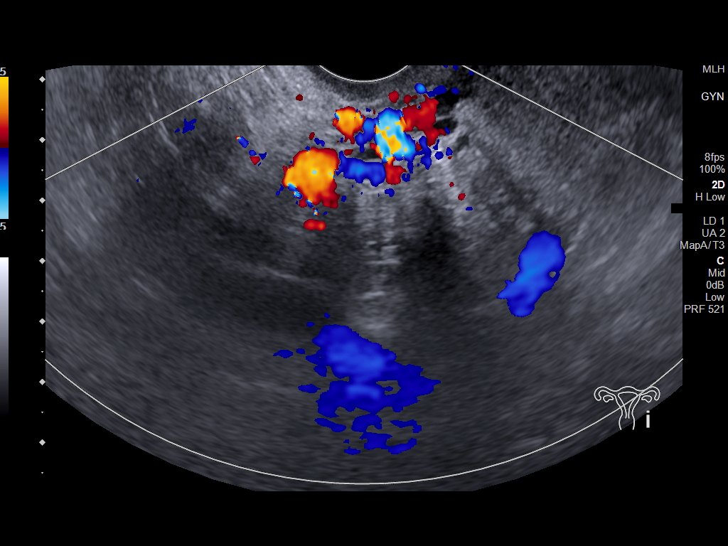

[14 of 25 positions shown; findings below may reference images not displayed]

FINDINGS: Uterus

Measurements: 9.6 x 5.8 x 7.2 cm = volume: 208 mL. I so to mildly
hypoechoic lobulated mass within the anterior uterine fundus
measuring 4.2 x 3.6 x 3.8 cm.

Endometrium

Thickness: 11 mm.  No focal abnormality visualized.

Right ovary

Not seen

Left ovary

Measurements: 3.7 x 1.9 x 2.3 cm = volume: 8 mL. Normal
appearance/no adnexal mass.

Other findings

Trace free fluid
IMPRESSION: 1. 4.2 cm anterior fundal fibroid.
2. Nonvisualized right ovary

## 2021-06-20 ENCOUNTER — Other Ambulatory Visit (HOSPITAL_COMMUNITY): Payer: Self-pay

## 2021-06-20 MED ORDER — PROPRANOLOL HCL 20 MG PO TABS
20.0000 mg | ORAL_TABLET | Freq: Four times a day (QID) | ORAL | 0 refills | Status: AC | PRN
  Filled 2021-06-20: qty 90, 23d supply, fill #0

## 2021-07-07 ENCOUNTER — Other Ambulatory Visit (HOSPITAL_COMMUNITY): Payer: Self-pay

## 2021-07-21 DIAGNOSIS — F17200 Nicotine dependence, unspecified, uncomplicated: Secondary | ICD-10-CM | POA: Diagnosis not present

## 2021-07-21 DIAGNOSIS — F1010 Alcohol abuse, uncomplicated: Secondary | ICD-10-CM | POA: Diagnosis not present

## 2021-07-21 DIAGNOSIS — F319 Bipolar disorder, unspecified: Secondary | ICD-10-CM | POA: Diagnosis not present

## 2021-07-21 DIAGNOSIS — F429 Obsessive-compulsive disorder, unspecified: Secondary | ICD-10-CM | POA: Diagnosis not present
